# Patient Record
Sex: Female | Born: 1935 | Race: White | Hispanic: No | Marital: Married | Smoking: Never smoker
Health system: Southern US, Community
[De-identification: ages and names within clinical notes are randomized; demographics above are authoritative.]

## PROBLEM LIST (undated history)

## (undated) DIAGNOSIS — J189 Pneumonia, unspecified organism: Secondary | ICD-10-CM

## (undated) DIAGNOSIS — M810 Age-related osteoporosis without current pathological fracture: Secondary | ICD-10-CM

## (undated) DIAGNOSIS — Z8489 Family history of other specified conditions: Secondary | ICD-10-CM

## (undated) DIAGNOSIS — I10 Essential (primary) hypertension: Secondary | ICD-10-CM

## (undated) DIAGNOSIS — G709 Myoneural disorder, unspecified: Secondary | ICD-10-CM

## (undated) DIAGNOSIS — F419 Anxiety disorder, unspecified: Secondary | ICD-10-CM

## (undated) DIAGNOSIS — I4819 Other persistent atrial fibrillation: Secondary | ICD-10-CM

## (undated) DIAGNOSIS — I429 Cardiomyopathy, unspecified: Secondary | ICD-10-CM

## (undated) DIAGNOSIS — R5382 Chronic fatigue, unspecified: Secondary | ICD-10-CM

## (undated) DIAGNOSIS — M1990 Unspecified osteoarthritis, unspecified site: Secondary | ICD-10-CM

## (undated) DIAGNOSIS — E785 Hyperlipidemia, unspecified: Secondary | ICD-10-CM

## (undated) DIAGNOSIS — I517 Cardiomegaly: Secondary | ICD-10-CM

## (undated) DIAGNOSIS — I428 Other cardiomyopathies: Secondary | ICD-10-CM

## (undated) DIAGNOSIS — I509 Heart failure, unspecified: Secondary | ICD-10-CM

## (undated) DIAGNOSIS — I5022 Chronic systolic (congestive) heart failure: Secondary | ICD-10-CM

## (undated) DIAGNOSIS — E782 Mixed hyperlipidemia: Secondary | ICD-10-CM

## (undated) HISTORY — DX: Age-related osteoporosis without current pathological fracture: M81.0

## (undated) HISTORY — PX: TOTAL KNEE ARTHROPLASTY: SHX125

## (undated) HISTORY — DX: Other cardiomyopathies: I42.8

## (undated) HISTORY — DX: Chronic fatigue, unspecified: R53.82

## (undated) HISTORY — DX: Mixed hyperlipidemia: E78.2

## (undated) HISTORY — DX: Chronic systolic (congestive) heart failure: I50.22

## (undated) HISTORY — PX: APPENDECTOMY: SHX54

## (undated) HISTORY — PX: OTHER SURGICAL HISTORY: SHX169

## (undated) HISTORY — DX: Essential (primary) hypertension: I10

## (undated) HISTORY — DX: Anxiety disorder, unspecified: F41.9

## (undated) HISTORY — PX: ROTATOR CUFF REPAIR: SHX139

## (undated) HISTORY — DX: Cardiomegaly: I51.7

## (undated) HISTORY — DX: Other persistent atrial fibrillation: I48.19

## (undated) HISTORY — DX: Hyperlipidemia, unspecified: E78.5

## (undated) HISTORY — DX: Heart failure, unspecified: I50.9

## (undated) HISTORY — DX: Cardiomyopathy, unspecified: I42.9

---

## 2001-09-15 ENCOUNTER — Encounter: Payer: Self-pay | Admitting: Cardiovascular Disease

## 2001-09-15 ENCOUNTER — Encounter: Payer: Self-pay | Admitting: *Deleted

## 2001-09-15 ENCOUNTER — Inpatient Hospital Stay (HOSPITAL_COMMUNITY): Admission: EM | Admit: 2001-09-15 | Discharge: 2001-09-19 | Payer: Self-pay | Admitting: Emergency Medicine

## 2001-09-16 ENCOUNTER — Encounter: Payer: Self-pay | Admitting: *Deleted

## 2005-12-18 ENCOUNTER — Ambulatory Visit: Payer: Self-pay | Admitting: Internal Medicine

## 2006-07-22 ENCOUNTER — Ambulatory Visit: Payer: Self-pay | Admitting: Unknown Physician Specialty

## 2006-07-22 ENCOUNTER — Encounter: Payer: Self-pay | Admitting: Unknown Physician Specialty

## 2006-08-01 ENCOUNTER — Encounter: Payer: Self-pay | Admitting: Unknown Physician Specialty

## 2007-01-02 ENCOUNTER — Ambulatory Visit: Payer: Self-pay | Admitting: Internal Medicine

## 2007-02-19 ENCOUNTER — Encounter: Payer: Self-pay | Admitting: Orthopedic Surgery

## 2007-03-02 ENCOUNTER — Encounter: Payer: Self-pay | Admitting: Orthopedic Surgery

## 2007-04-01 ENCOUNTER — Encounter: Payer: Self-pay | Admitting: Orthopedic Surgery

## 2007-05-15 ENCOUNTER — Ambulatory Visit: Payer: Self-pay | Admitting: General Surgery

## 2007-12-17 ENCOUNTER — Encounter: Payer: Self-pay | Admitting: Cardiovascular Disease

## 2008-02-18 ENCOUNTER — Ambulatory Visit: Payer: Self-pay | Admitting: Internal Medicine

## 2008-02-20 ENCOUNTER — Ambulatory Visit: Payer: Self-pay | Admitting: Internal Medicine

## 2009-02-16 ENCOUNTER — Encounter: Payer: Self-pay | Admitting: Orthopedic Surgery

## 2009-02-21 ENCOUNTER — Ambulatory Visit: Payer: Self-pay | Admitting: Internal Medicine

## 2009-03-01 ENCOUNTER — Encounter: Payer: Self-pay | Admitting: Orthopedic Surgery

## 2009-03-31 ENCOUNTER — Encounter: Payer: Self-pay | Admitting: Orthopedic Surgery

## 2009-08-30 ENCOUNTER — Encounter: Payer: Self-pay | Admitting: Cardiovascular Disease

## 2009-11-24 ENCOUNTER — Encounter: Payer: Self-pay | Admitting: Orthopedic Surgery

## 2009-11-29 ENCOUNTER — Encounter: Payer: Self-pay | Admitting: Orthopedic Surgery

## 2009-12-30 ENCOUNTER — Encounter: Payer: Self-pay | Admitting: Orthopedic Surgery

## 2009-12-30 ENCOUNTER — Encounter: Payer: Self-pay | Admitting: Cardiovascular Disease

## 2010-01-29 ENCOUNTER — Encounter: Payer: Self-pay | Admitting: Orthopedic Surgery

## 2010-02-21 ENCOUNTER — Encounter: Payer: Self-pay | Admitting: Cardiovascular Disease

## 2010-02-22 ENCOUNTER — Ambulatory Visit: Payer: Self-pay | Admitting: Internal Medicine

## 2010-05-03 ENCOUNTER — Ambulatory Visit: Payer: Self-pay | Admitting: Cardiovascular Disease

## 2010-05-03 DIAGNOSIS — E785 Hyperlipidemia, unspecified: Secondary | ICD-10-CM | POA: Insufficient documentation

## 2010-05-03 DIAGNOSIS — I10 Essential (primary) hypertension: Secondary | ICD-10-CM

## 2010-05-03 DIAGNOSIS — I517 Cardiomegaly: Secondary | ICD-10-CM

## 2010-05-05 ENCOUNTER — Encounter: Payer: Self-pay | Admitting: Cardiovascular Disease

## 2010-06-19 ENCOUNTER — Ambulatory Visit: Payer: Self-pay | Admitting: Cardiovascular Disease

## 2010-07-07 ENCOUNTER — Encounter: Payer: Self-pay | Admitting: Cardiovascular Disease

## 2010-09-08 ENCOUNTER — Ambulatory Visit: Payer: Self-pay | Admitting: Internal Medicine

## 2010-10-31 NOTE — Letter (Signed)
Summary: Historic Patient File  Historic Patient File   Imported By: [place] Carbo 05/05/2010 09:14:57  _____________________________________________________________________  External Attachment:    Type:   Image     Comment:   External Document

## 2010-10-31 NOTE — Assessment & Plan Note (Signed)
Summary: ROV/AMD   Visit Type:  Follow-up Primary Provider:  Katherina Right Tate,M.D.  CC:  c/o shortness of breath when in a big hurry..  History of Present Illness: Robin Ortiz is a pleasant 76 year old woman with a history of hypertension, hyperlipidemia, remote history of nonischemic cardiomyopathy with initial ejection fraction 20-30% in 2002 that has improved to 50-55% on echocardiogram in March 2009, status post bilateral knee replacement who presents for routine follow up.   She reports that her blood pressure has been labile. She has been recording frequent systolic pressures in the 150s to 160s. Sometimes it decreases rapidly to low 100 systolic. She otherwise feels well. She does take care of numerous grandchildren and does have some stress. She does not exercise. She denies any symptoms of chest discomfort or shortness of breath.  Old EKG shows normal sinus rhythm with rate 59 beats per minute, no significant ST or T wave changes. Voltage criteria for LVH.  Remote cardiac catheterization in 2002 showing mild RCA disease at the ostium.  Most recent cholesterol from April of this year shows total cholesterol of 136, LDL 70, HDL 48  Current Medications (verified): 1)  Potassium Chloride Crys Cr 20 Meq Cr-Tabs (Potassium Chloride Crys Cr) .... Take One Tablet By Mouth As Needed With Lasix 2)  Furosemide 20 Mg Tabs (Furosemide) .... Take As Directed 3)  Aspirin 81 Mg Tbec (Aspirin) .... Take One Tablet By Mouth Daily 4)  Carvedilol 25 Mg Tabs (Carvedilol) .... Take One Tablet By Mouth Twice A Day 5)  Ramipril 10 Mg Caps (Ramipril) .... Take One Capsule By Mouth Two Times A Day 6)  Evista 60 Mg Tabs (Raloxifene Hcl) .... Once Daily 7)  Simvastatin 20 Mg Tabs (Simvastatin) .... Take One Tablet By Mouth Daily At Bedtime 8)  Multivitamins   Tabs (Multiple Vitamin) .... Once Daily 9)  Celebrex 200 Mg Caps (Celecoxib) 10)  Allegra 180 Mg Tabs (Fexofenadine Hcl) 11)  Calcium Carbonate-Vitamin D  600-400 Mg-Unit  Tabs (Calcium Carbonate-Vitamin D) .... Once Daily 12)  Fish Oil   Oil (Fish Oil) .... Once Daily 13)  Osteo Bi-Flex Joint Shield  Tabs (Misc Natural Products)  Allergies (verified): No Known Drug Allergies  Past History:  Past Medical History: Last updated: 05/03/2010 cardiomegaly Hyperlipidemia Hypertension  Past Surgical History: Last updated: 05/03/2010 Knee Arthroplasty-Total rotator cuff (left shoulder)  Family History: Last updated: 05/03/2010 Family History of Coronary Artery Disease: Father  Social History: Last updated: 05/03/2010 Retired  Married  Tobacco Use - No.  Alcohol Use - yes-- occas. glass of wine Drug Use - no Regular Exercise - yes--water aerobics twice a week.  Risk Factors: Exercise: yes (05/03/2010)  Risk Factors: Smoking Status: never (05/03/2010)  Review of Systems  The patient denies fever, weight loss, weight gain, vision loss, decreased hearing, hoarseness, chest pain, syncope, dyspnea on exertion, peripheral edema, prolonged cough, abdominal pain, incontinence, muscle weakness, depression, and enlarged lymph nodes.    Vital Signs:  Patient profile:   76 year old female Height:      66 inches Weight:      151 pounds BMI:     24.46 Pulse rate:   63 / minute BP sitting:   158 / 80  (left arm) Cuff size:   regular  Vitals Entered By: Bishop Dublin, CMA (June 19, 2010 3:37 PM)  Physical Exam  General:  Well developed, well nourished, in no acute distress. Head:  normocephalic and atraumatic Neck:  Neck supple, no JVD. No masses, thyromegaly  or abnormal cervical nodes. Lungs:  Clear bilaterally to auscultation and percussion. Heart:  Non-displaced PMI, chest non-tender; regular rate and rhythm, S1, S2 without murmurs, rubs or gallops. Carotid upstroke normal, no bruit.  Pedals normal pulses. No edema, no varicosities. Abdomen:  Bowel sounds positive; abdomen soft and non-tender without masses Msk:  Back  normal, normal gait. Muscle strength and tone normal. Pulses:  pulses normal in all 4 extremities Extremities:  No clubbing or cyanosis. Neurologic:  Alert and oriented x 3. Skin:  Intact without lesions or rashes. Psych:  Normal affect.   Impression & Recommendations:  Problem # 1:  HYPERTENSION, BENIGN (ICD-401.1) she has labile blood pressures. Typically they aren't elevated in the morning. We have suggested that she take her evening medications for bed rather than at 6 PM. If her pressures continued to be elevated, we will start amlodipine 2.5 mg daily, possibly titrating to 5 mg daily. If she has periods of hypotension, we may need to add a more short acting medication such as hydralazine.  Her updated medication list for this problem includes:    Furosemide 20 Mg Tabs (Furosemide) .Marland Kitchen... Take as directed    Aspirin 81 Mg Tbec (Aspirin) .Marland Kitchen... Take one tablet by mouth daily    Carvedilol 25 Mg Tabs (Carvedilol) .Marland Kitchen... Take one tablet by mouth twice a day    Ramipril 10 Mg Caps (Ramipril) .Marland Kitchen... Take one capsule by mouth two times a day    Amlodipine Besylate 5 Mg Tabs (Amlodipine besylate) .Marland Kitchen... Take 1/2-1  tablet by mouth daily  Problem # 2:  HYPERLIPIDEMIA-MIXED (ICD-272.4) Cholesterol is at goal on her current  medication regimen.  Her updated medication list for this problem includes:    Simvastatin 20 Mg Tabs (Simvastatin) .Marland Kitchen... Take one tablet by mouth daily at bedtime  Patient Instructions: 1)  Your physician has recommended you make the following change in your medication: START amolodipine 5mg  start taking 1/2 tab 2)  Your physician has requested that you regularly monitor and record your blood pressure readings at home.  Please call with your readings in 1 week.  Prescriptions: AMLODIPINE BESYLATE 5 MG TABS (AMLODIPINE BESYLATE) Take 1/2-1  tablet by mouth daily  #30 x 6   Entered by:   Benedict Needy, RN   Authorized by:   Dossie Arbour MD   Signed by:   Benedict Needy,  RN on 06/19/2010   Method used:   Electronically to        [facility]* (retail)       [address] [place], [place]  [address]       Ph: [phone]       Fax: [phone]   RxID:   [id number]

## 2010-10-31 NOTE — Letter (Signed)
Summary: Medical Record Release  Medical Record Release   Imported By: Harlon Flor 08/09/2010 15:53:29  _____________________________________________________________________  External Attachment:    Type:   Image     Comment:   External Document

## 2010-10-31 NOTE — Cardiovascular Report (Signed)
Summary: Cardiac Cath Other  Cardiac Cath Other   Imported By: Harlon Flor 03/22/2010 08:20:05  _____________________________________________________________________  External Attachment:    Type:   Image     Comment:   External Document

## 2010-10-31 NOTE — Assessment & Plan Note (Signed)
Summary: NP6/GLC   Visit Type:  Initial Consult Primary Provider:  Katherina Right Tate,M.D.  CC:  "Doing well"..  History of Present Illness: Robin Ortiz is a pleasant 76 year old woman with a history of hypertension, hyperlipidemia, remote history of nonischemic cardiomyopathy with initial ejection fraction 20-30% in 2002 that has improved to 50-55% on echocardiogram in March 2009, status post bilateral knee replacement who presents to establish care. She was last seen by myself at [facility] in November 2010.  Overall she states that she is doing well. She does not exercise to any significant degree though she does take care of numerous grandchildren. There has been significant stress in her life. She would like to take some time off away from family to relax. She has had some symptoms of shortness of breath, sometimes with exertion such as with swimming. This seems to come and go and she is uncertain if it is due to stress. It also may be due to the humid weather. [place].  EKG shows normal sinus rhythm with rate 59 beats per minute, no significant ST or T wave changes. Voltage criteria for LVH.  Remote cardiac catheterization in 2002 showing mild RCA disease at the ostium.  Most recent cholesterol from April of this year shows total cholesterol of 136, LDL 70, HDL 48  Preventive Screening-Counseling & Management  Alcohol-Tobacco     Smoking Status: never  Caffeine-Diet-Exercise     Does Patient Exercise: yes      Drug Use:  no.    Current Medications (verified): 1)  Potassium Chloride Crys Cr 20 Meq Cr-Tabs (Potassium Chloride Crys Cr) .... Take One Tablet By Mouth As Needed With Lasix 2)  Furosemide 20 Mg Tabs (Furosemide) .... Take As Directed 3)  Aspirin 81 Mg Tbec (Aspirin) .... Take One Tablet By Mouth Daily 4)  Carvedilol 25 Mg Tabs (Carvedilol) .... Take One Tablet By Mouth Twice A Day 5)   Ramipril 10 Mg Caps (Ramipril) .... Take One Capsule By Mouth Two Times A Day 6)  Evista 60 Mg Tabs (Raloxifene Hcl) .... Once Daily 7)  Simvastatin 20 Mg Tabs (Simvastatin) .... Take One Tablet By Mouth Daily At Bedtime 8)  Multivitamins   Tabs (Multiple Vitamin) .... Once Daily 9)  Celebrex 200 Mg Caps (Celecoxib) 10)  Allegra 180 Mg Tabs (Fexofenadine Hcl) 11)  Calcium Carbonate-Vitamin D 600-400 Mg-Unit  Tabs (Calcium Carbonate-Vitamin D) .... Once Daily 12)  Fish Oil   Oil (Fish Oil) .... Once Daily 13)  Osteo Bi-Flex Joint Shield  Tabs (Misc Natural Products)  Allergies (verified): No Known Drug Allergies  Past History:  Past Medical History: cardiomegaly Hyperlipidemia Hypertension  Past Surgical History: Knee Arthroplasty-Total rotator cuff (left shoulder)  Family History: Family History of Coronary Artery Disease: Father  Social History: Retired  Married  Tobacco Use - No.  Alcohol Use - yes-- occas. glass of wine Drug Use - no Regular Exercise - yes--water aerobics twice a week. Smoking Status:  never Drug Use:  no Does Patient Exercise:  yes  Review of Systems       The patient complains of dyspnea on exertion.  The patient denies fever, weight loss, weight gain, vision loss, decreased hearing, hoarseness, chest pain, syncope, peripheral edema, prolonged cough, abdominal pain, incontinence, muscle weakness, depression, and enlarged lymph nodes.    Vital Signs:  Patient profile:   76 year old female Height:  66 inches Weight:      148 pounds BMI:     23.97 Pulse rate:   59 / minute BP sitting:   155 / 83  (left arm) Cuff size:   regular  Vitals Entered By: Robin Ortiz, CMA (May 03, 2010 3:14 PM)  Physical Exam  General:  Well developed, well nourished, in no acute distress. Head:  normocephalic and atraumatic Neck:  Neck supple, no JVD. No masses, thyromegaly or abnormal cervical nodes. Lungs:  Clear bilaterally to auscultation and  percussion. Heart:  Non-displaced PMI, chest non-tender; regular rate and rhythm, S1, S2 without murmurs, rubs or gallops. Carotid upstroke normal, no bruit.  Pedals normal pulses. No edema, no varicosities. Abdomen:  Bowel sounds positive; abdomen soft and non-tender without masses Msk:  Back normal, normal gait. Muscle strength and tone normal. Pulses:  pulses normal in all 4 extremities Extremities:  No clubbing or cyanosis. Neurologic:  Alert and oriented x 3. Skin:  Intact without lesions or rashes. Psych:  Normal affect.   Impression & Recommendations:  Problem # 1:  CARDIOMEGALY (ICD-429.3) significantly improved systolic function based on echocardiogram in 2009. No symptoms of heart failure. She does have some symptoms of shortness of breath which wax and wane, and with exertion. This may be due to her hypertension.  Orders: EKG w/ Interpretation (93000)  Problem # 2:  HYPERTENSION, BENIGN (ICD-401.1) Blood pressure is elevated on recheck. Systolic of 160 bilaterally. I've asked her to buy a new blood pressure cuff, monitor her blood pressure at home and call us with the results over the next week or 2. Additional medications that we could use for blood pressure include amlodipine.  Her updated medication list for this problem includes:    Furosemide 20 Mg Tabs (Furosemide) .Marland Kitchen... Take as directed    Aspirin 81 Mg Tbec (Aspirin) .Marland Kitchen... Take one tablet by mouth daily    Carvedilol 25 Mg Tabs (Carvedilol) .Marland Kitchen... Take one tablet by mouth twice a day    Ramipril 10 Mg Caps (Ramipril) .Marland Kitchen... 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 We'll continue to treat her aggressively.  Her updated medication list for this problem includes:    Simvastatin 20 Mg Tabs (Simvastatin) .Marland Kitchen... Take one tablet by mouth daily at bedtime  Patient Instructions: 1)  Your  physician wants you to follow-up in:   1 year You will receive a reminder letter in the mail two months in advance. If you don't receive a letter, please call our office to schedule the follow-up appointment. 2)  Your physician has requested that you regularly monitor and record your blood pressure readings at home.  Please use the same machine at the same time of day to check your readings and record them to bring to your follow-up visit.

## 2010-12-14 ENCOUNTER — Encounter: Payer: Self-pay | Admitting: Cardiovascular Disease

## 2010-12-14 ENCOUNTER — Ambulatory Visit (INDEPENDENT_AMBULATORY_CARE_PROVIDER_SITE_OTHER): Payer: Medicare Other | Admitting: Cardiovascular Disease

## 2010-12-14 DIAGNOSIS — I10 Essential (primary) hypertension: Secondary | ICD-10-CM

## 2010-12-14 DIAGNOSIS — R0602 Shortness of breath: Secondary | ICD-10-CM

## 2010-12-14 DIAGNOSIS — I429 Cardiomyopathy, unspecified: Secondary | ICD-10-CM | POA: Insufficient documentation

## 2010-12-14 DIAGNOSIS — E785 Hyperlipidemia, unspecified: Secondary | ICD-10-CM

## 2010-12-19 NOTE — Assessment & Plan Note (Signed)
Summary: SOB/f/u appt/sab   Visit Type:  Follow-up Primary Provider:  Katherina Right Tate,M.D.  CC:  c/o SOB and pt has had pneumonia since pt has been here last. Pt is also c/o fatigue. Denies chest pain.Marland Kitchen  History of Present Illness: Robin Ortiz is a pleasant 76 year old woman with a history of hypertension, hyperlipidemia, remote history of nonischemic cardiomyopathy with initial ejection fraction 20-30% in 2002 that has improved to 50-55% on echocardiogram in March 2009, status post bilateral knee replacement who presents for routine follow up.   Robin Ortiz reports that she has had pneumonia in December and again in February. She was concerned that some of her shortness of breath was cardiac etiology. She denies any lower extremity edema. She has a cough but this has improved. She does have some hoarseness which she feels is secondary to allergies. No PND or orthopnea.  EKG shows normal sinus rhythm with rate 61 beats per minute with nonspecific ST changes in V5, V6, 3 and aVF  Remote cardiac catheterization in 2002 showing mild RCA disease at the ostium.  Most recent cholesterol from April of 2011 shows total cholesterol of 136, LDL 70, HDL 48  Current Medications (verified): 1)  Aspirin 81 Mg Tbec (Aspirin) .... Take One Tablet By Mouth Daily 2)  Carvedilol 25 Mg Tabs (Carvedilol) .... Take One Tablet By Mouth Twice A Day 3)  Ramipril 10 Mg Caps (Ramipril) .... Take One Capsule By Mouth Two Times A Day 4)  Evista 60 Mg Tabs (Raloxifene Hcl) .... Once Daily 5)  Simvastatin 20 Mg Tabs (Simvastatin) .... Take One Tablet By Mouth Daily At Bedtime 6)  Multivitamins   Tabs (Multiple Vitamin) .... Once Daily 7)  Celebrex 200 Mg Caps (Celecoxib) 8)  Allegra 180 Mg Tabs (Fexofenadine Hcl) 9)  Calcium Carbonate-Vitamin D 600-400 Mg-Unit  Tabs (Calcium Carbonate-Vitamin D) .... Once Daily 10)  Fish Oil   Oil (Fish Oil) .... Once Daily 11)  Osteo Bi-Flex Joint Shield  Tabs (Misc Natural Products) 12)   Amlodipine Besylate 5 Mg Tabs (Amlodipine Besylate) .... 1/2 Tablet Two Times A Day  Allergies (verified): No Known Drug Allergies  Past History:  Past Medical History: Last updated: 05/03/2010 cardiomegaly Hyperlipidemia Hypertension  Past Surgical History: Last updated: 05/03/2010 Knee Arthroplasty-Total rotator cuff (left shoulder)  Family History: Last updated: 05/03/2010 Family History of Coronary Artery Disease: Father  Social History: Last updated: 05/03/2010 Retired  Married  Tobacco Use - No.  Alcohol Use - yes-- occas. glass of wine Drug Use - no Regular Exercise - yes--water aerobics twice a week.  Risk Factors: Exercise: yes (05/03/2010)  Risk Factors: Smoking Status: never (05/03/2010)  Review of Systems  The patient denies fever, weight loss, weight gain, vision loss, decreased hearing, hoarseness, chest pain, syncope, dyspnea on exertion, peripheral edema, prolonged cough, abdominal pain, incontinence, muscle weakness, depression, and enlarged lymph nodes.         course voice, coughing  Vital Signs:  Patient profile:   76 year old female Height:      66 inches Weight:      151.50 pounds BMI:     24.54 Pulse rate:   61 / minute BP sitting:   124 / 68  (left arm) Cuff size:   regular  Vitals Entered By: Lysbeth Galas CMA (December 14, 2010 10:53 AM)  Physical Exam  General:  Well developed, well nourished, in no acute distress. Head:  normocephalic and atraumatic Neck:  Neck supple, no JVD. No masses, thyromegaly or abnormal  cervical nodes. Lungs:  Clear bilaterally to auscultation and percussion. Heart:  Non-displaced PMI, chest non-tender; regular rate and rhythm, S1, S2 without murmurs, rubs or gallops. Carotid upstroke normal, no bruit.  Pedals normal pulses. No edema, no varicosities. Abdomen:  Bowel sounds positive; abdomen soft and non-tender without masses Msk:  Back normal, normal gait. Muscle strength and tone normal. Pulses:  pulses  normal in all 4 extremities Extremities:  No clubbing or cyanosis. Neurologic:  Alert and oriented x 3. Skin:  Intact without lesions or rashes. Psych:  Normal affect.   Impression & Recommendations:  Problem # 1:  CARDIOMYOPATHY, SECONDARY (ICD-425.9) previous scar moderately with medical management and improved ejection fraction. Her current presentation or shortness of breath is likely noncardiac and probably residual inflammatory lung disease from her recent pneumonia. No further medication changes made.  The following medications were removed from the medication list:    Furosemide 20 Mg Tabs (Furosemide) .Marland Kitchen... Take as directed Her updated medication list for this problem includes:    Aspirin 81 Mg Tbec (Aspirin) .Marland Kitchen... Take one tablet by mouth daily    Carvedilol 25 Mg Tabs (Carvedilol) .Marland Kitchen... Take one tablet by mouth twice a day    Ramipril 10 Mg Caps (Ramipril) .Marland Kitchen... Take one capsule by mouth two times a day    Amlodipine Besylate 5 Mg Tabs (Amlodipine besylate) .Marland Kitchen... 1/2 tablet two times a day  Problem # 2:  HYPERTENSION, BENIGN (ICD-401.1) Blood pressure is well controlled on her current medication regimen. No changes made.  The following medications were removed from the medication list:    Furosemide 20 Mg Tabs (Furosemide) .Marland Kitchen... Take as directed Her updated medication list for this problem includes:    Aspirin 81 Mg Tbec (Aspirin) .Marland Kitchen... Take one tablet by mouth daily    Carvedilol 25 Mg Tabs (Carvedilol) .Marland Kitchen... Take one tablet by mouth twice a day    Ramipril 10 Mg Caps (Ramipril) .Marland Kitchen... Take one capsule by mouth two times a day    Amlodipine Besylate 5 Mg Tabs (Amlodipine besylate) .Marland Kitchen... 1/2 tablet two times a day  Problem # 3:  HYPERLIPIDEMIA-MIXED (ICD-272.4) We have stressed that she continue on her current dose of simvastatin.  Her updated medication list for this problem includes:    Simvastatin 20 Mg Tabs (Simvastatin) .Marland Kitchen... Take one tablet by mouth daily at  bedtime  Other Orders: EKG w/ Interpretation (93000)

## 2011-01-25 ENCOUNTER — Encounter: Payer: Self-pay | Admitting: Cardiovascular Disease

## 2011-02-02 ENCOUNTER — Encounter: Payer: Self-pay | Admitting: Cardiovascular Disease

## 2011-02-16 NOTE — Discharge Summary (Signed)
[place]  Patient:    Robin Ortiz, [name] Visit Number: [id number] MRN: [id number]          Service Type: MED Location: [phone] Attending Physician:  Darlin Priestly Dictated by:   Marya Fossa, P.A. Admit Date:  09/15/2001 Discharge Date: 09/19/2001   CC:         Dewaine Oats, M.D.   Discharge Summary  DATE OF BIRTH:  11/25/1934  [facility] MEDICAL RECORD NUMBER:  [phone]  ADMISSION DIAGNOSES: 1. Congestive heart failure. 2. Unstable angina. 3. Hypertension, uncontrolled.  DISCHARGE DIAGNOSES: 1. Congestive heart failure, resolved. 2. Noncritical coronary artery disease, negative myocardial infarction. 3. Left ventricular dysfunction ejection fraction 20 to 25% with mild    mitral regurgitation. 4. Hypertension - improved control.  HISTORY OF PRESENT ILLNESS:  Ms. Robin Ortiz is a pleasant 76 year old white female patient of Lenise Herald, M.D. who has had shortness of breath for four to five weeks.  She was seen in the office on September 03, 2001, and a Cardiolite and two-dimensional echocardiogram were scheduled and Altace was added for hypertension.  However, this past weekend, [place] and developed increased shortness of breath with any activity as well as lower extremity edema.  She also had some mild left anterior chest discomfort which was aching and extended to the scapula.  The symptoms lasted most of the weekend and are still present.  She called the office this morning and was instructed to come to the emergency room.  In the ER, EKG showed small Q wave in 1 which is the only real change from September 03, 2001, question significance.  Chest x-ray shows increased vascular congestion and cardiac enlargement.  Initial cardiac enzymes were negative. Potassium 3.6, BUN 15, creatinine 0.9, and hemoglobin 15.  The patient will be admitted for IV nitroglycerin, IV heparin,  IV Lasix, serial cardiac enzymes.  Will plan cardiac catheterization (left and right heart).  PROCEDURE:  Cardiac catheterization September 15, 2001, by Lenise Herald, M.D. Complications were none.  HOSPITAL COURSE:  Ms. Robin Ortiz was admitted to [facility] on September 15, 2001, with heart failure and chest pain.  Admitting labs are as above and essentially unremarkable.  Cardiac enzymes were negative.  The patient was taken to the cardiac catheterization lab by Dr. Jenne Campus on September 15, 2001, which showed essentially normal coronary arteries with a small 50% ostial RCA lesion.  EF significantly depressed at 20 to 25% and there was a mild MR.  Distal aortography was unremarkable.  Dr. Jenne Campus recommended beginning milrinone therapy IV and uptitrate ACE inhibitor therapy.  We will add digoxin and Coreg.  We will continue to diurese.  For right and left heart catheterization pressures, please see dictated cardiac catheterization report.  The patient was treated with milrinone therapy and diuresis.  We also continued to uptitrate medication as tolerated in the hospital.  Formal two-dimensional echocardiogram was performed on September 16, 2001, and this revealed an EF of 20 to 30% with severe diffuse left ventricular hypokinesis, moderate MR, mild RV hypokinesis.  The patient diuresed very well during the hospital stay over 5 liters. Her symptoms of shortness of breath significantly improved.  Blood pressure control was improved with medication adjustment.  On September 19, 2001, the patient continued to remain stable.  Lab work is pending at this point in time.  If everything looks okay, she will be discharged to home today.  Milrinone  of course was weaned over the hospital course and discontinued by December 19.  DISCHARGE MEDICATIONS: 1. Digoxin 0.125 mg a day. 2. Lasix 40 mg a day. 3. Coreg 3.125 mg b.i.d. 4. Zocor 20 mg q.h.s. 5. Allegra and Evista as  before. 6. K-Dur 20 mEq a day. 7. Altace 10 mg b.i.d. 8. Coated aspirin 81 mg a day.  ACTIVITY:  No strenuous activity until seen back.  DIET:  No added salt, 2 gram salt restriction.  1.5 liter fluid restriction. Weigh and record daily.  If weight is up 2 pounds in one day or 5 pounds in one week, she is to call.  We will have her get a BMP on September 25, 2001.  She will see Dr. Jenne Campus back on December 27, at 9 a.m. Dictated by:   Marya Fossa, P.A. Attending Physician:  Darlin Priestly DD:  09/19/01 TD:  09/21/01 Job: [id number]

## 2011-02-16 NOTE — Cardiovascular Report (Signed)
[place]  Patient:    Robin Ortiz, Robin Ortiz Visit Number: [id number] MRN: [id number]          Service Type: MED Location: [phone] Attending Physician:  Darlin Priestly Dictated by:   Darlin Priestly, M.D. Proc. Date: 09/15/01 Admit Date:  09/15/2001   CC:         Dewaine Oats, M.D., [place], [place]                        Cardiac Catheterization  PROCEDURES: 1. Right heart catheterization. 2. Left heart catheterization. 3. Coronary angiography. 4. Left ventriculogram. 5. Abdominal aortogram. 6. Cardiac outputs.  COMPLICATIONS: None.  INDICATIONS: The patient is a 76 year old female, patient of Dr. Dewaine Oats in [place], [place] and Dr. Lenise Herald, with a several month history of increasing dyspnea on exertion and occasional lower extremity edema. The patient had a marked increase in her symptoms over the last 48 hours and presented to the ER with bibasilar crackles and symptoms consistent with CHF. She is now brought for cardiac catheterization to define her coronary anatomy and to obtain her right heart hemodynamics.  DESCRIPTION OF PROCEDURE: After given informed written consent, the patient was brought to the cardiac catheterization lab where her right and left groins were shaved, prepped, and draped in the usual sterile fashion.  ECG monitoring was established.  Using modified Seldinger technique, a #8 French venous sheath was inserted in the right femoral vein, and a 6 [place] arterial sheath inserted in the right femoral artery. Next, under fluoroscopic guidance a #7 [place] Swan-Ganz catheter was then floated into the RA, RV, PA, and pulmonary wedge position and hemodynamic measurements were made in each. A 6 French diagnostic catheter was then used to perform diagnostic angiography. This reveals a large left main with no significant disease.  The LAD is a large vessel which coursed to the apex and gave rise to  two diagonal branches. The LAD has no significant disease. The first and second diagonals are medium sized vessels with no significant disease.  Left circumflex is a large vessel which coursed in the AV groove and gave rise to two obtuse marginal branches. The AV groove circumflex has no significant disease. The first OM is a large vessel which bifurcates distally and has no significant disease. The second OM is a medium sized vessel with no significant disease.  The right coronary artery is a large vessel which is dominant and gives rise to both the PDA as well as the posterolateral branch. There is 50% ostial RCA lesion. The PDA and posterolateral branch have no significant disease.  LEFT VENTRICULOGRAM: Left ventriculogram revealed moderate to severely depressed left ventricular systolic function. Estimated ejection fraction of 20-25% with global hypokinesis.  There is mild mitral regurgitation.  ABDOMINAL AORTOGRAM: Abdominal aortogram reveals no evidence of significant renal artery stenosis or aneurysm.  HEMODYNAMICS: RA 16, RV 64/16, PA ______ of 24. Pulmonary capillary wedge pressure of 24, systemic arterial pressure 161/86, LV systemic pressure 168/18, LVEDP of 27. Cardiac output is 3.3. Cardiac index 1.8. PA saturation 76%. AO saturation 88% on 2 L O2.  CONCLUSIONS: 1. No significant coronary artery disease. 2. Moderate severely depressed left ventricular systolic function. 3. Mild mitral regurgitation. 4. No evidence of renal artery stenosis. 5. Elevated pulmonary capillary wedge pressure with moderate pulmonary    hypertension. 6. Cardiac output 3.3, cardiac index 1.8. PA saturation 76%, AO saturation    88%. 7.  Systemic hypertension. Dictated by:   Darlin Priestly, M.D. Attending Physician:  Darlin Priestly DD:  09/15/01 TD:  09/16/01 Job: [id number]

## 2011-03-02 ENCOUNTER — Ambulatory Visit: Payer: Self-pay | Admitting: Internal Medicine

## 2011-04-26 ENCOUNTER — Telehealth: Payer: Self-pay

## 2011-04-26 MED ORDER — CARVEDILOL 25 MG PO TABS: 25.0000 mg | ORAL_TABLET | Freq: Two times a day (BID) | ORAL | Status: DC

## 2011-04-26 NOTE — Telephone Encounter (Signed)
Requesting a refill on carvedilol 25 mg take one tablet twice a day.

## 2011-08-30 ENCOUNTER — Telehealth: Payer: Self-pay

## 2011-08-30 MED ORDER — AMLODIPINE BESYLATE 5 MG PO TABS: ORAL_TABLET | ORAL | Status: DC

## 2011-08-30 NOTE — Telephone Encounter (Signed)
Refill sent for amlodipine 5 mg take 1/2-1 tablet by mouth daily.

## 2011-12-14 ENCOUNTER — Encounter: Payer: Self-pay | Admitting: *Deleted

## 2011-12-17 ENCOUNTER — Ambulatory Visit (INDEPENDENT_AMBULATORY_CARE_PROVIDER_SITE_OTHER): Payer: Medicare Other | Admitting: Cardiovascular Disease

## 2011-12-17 ENCOUNTER — Encounter: Payer: Self-pay | Admitting: Cardiovascular Disease

## 2011-12-17 VITALS — BP 152/88 | HR 60 | Ht 66.0 in | Wt 156.0 lb

## 2011-12-17 DIAGNOSIS — I517 Cardiomegaly: Secondary | ICD-10-CM

## 2011-12-17 DIAGNOSIS — E785 Hyperlipidemia, unspecified: Secondary | ICD-10-CM

## 2011-12-17 DIAGNOSIS — I429 Cardiomyopathy, unspecified: Secondary | ICD-10-CM

## 2011-12-17 DIAGNOSIS — I10 Essential (primary) hypertension: Secondary | ICD-10-CM

## 2011-12-17 NOTE — Assessment & Plan Note (Signed)
Cholesterol is at goal on the current lipid regimen. No changes to the medications were made.  

## 2011-12-17 NOTE — Assessment & Plan Note (Signed)
Blood pressure is elevated. We have suggested if this continues to be elevated at home, that she increase her amlodipine to 5 mg daily up from 2.5 mg.

## 2011-12-17 NOTE — Patient Instructions (Signed)
You are doing well. No medication changes were made. Monitor your blood pressure at home  Please call us if you have new issues that need to be addressed before your next appt.  Your physician wants you to follow-up in: 12 months.  You will receive a reminder letter in the mail two months in advance. If you don't receive a letter, please call our office to schedule the follow-up appointment.

## 2011-12-17 NOTE — Progress Notes (Signed)
Patient ID: Robin Ortiz, female    DOB: 04-05-1935, 77 y.o.   MRN: [id number]  HPI Comments: [name] is a pleasant 77 year old woman with a history of hypertension, hyperlipidemia, remote history of nonischemic cardiomyopathy with initial ejection fraction 20-30% in 2002 that has improved to 50-55% on echocardiogram in March 2009, status post bilateral knee replacement who presents for routine follow up.     [name] reports that she is doing well. She works out at [employer] doing [employer] at least twice per week. The other days she has a busy lifestyle and takes care of her grandchildren. She does have some fatigue during the daytime. No symptoms of shortness of breath, edema, lightheadedness, weight gain.  Total cholesterol 146, LDL 75 Recent weight gain She did a lifeline screening showed no significant carotid arterial disease, no AAA, no significant PAD. She is low risk for osteoporosis   EKG shows normal sinus rhythm with rate 52 beats per minute with no significant ST or T wave changes, rare APC   Remote cardiac catheterization in 2002 showing mild RCA disease at the ostium.     Outpatient Encounter Prescriptions as of 12/17/2011  Medication Sig Dispense Refill  . amLODipine (NORVASC) 5 MG tablet Take 1/2-1 tablet by mouth daily.  30 tablet  6  . aspirin 81 MG tablet Take 81 mg by mouth daily.        . Calcium-Vitamin D 600-200 MG-UNIT per tablet Take 1 tablet by mouth daily.        . carvedilol (COREG) 25 MG tablet Take 25 mg by mouth daily.      . furosemide (LASIX) 20 MG tablet Take 20 mg by mouth as needed.       . Misc Natural Products (OSTEO BI-FLEX ADV JOINT SHIELD PO) Take 1 tablet by mouth daily.        . Multiple Vitamin (MULTIVITAMIN) tablet Take 1 tablet by mouth daily.        . Omega-3 Fatty Acids (FISH OIL) 1000 MG CAPS Take 1 capsule by mouth daily.        . potassium chloride (KLOR-CON) 20 MEQ packet Take 20 mEq by mouth daily as needed. With lasix        . raloxifene (EVISTA) 60 MG tablet Take 60 mg by mouth daily.        . ramipril (ALTACE) 10 MG capsule Take 10 mg by mouth 2 (two) times daily.        . simvastatin (ZOCOR) 20 MG tablet Take 20 mg by mouth at bedtime.        Marland Kitchen DISCONTD: carvedilol (COREG) 25 MG tablet Take 1 tablet (25 mg total) by mouth 2 (two) times daily with a meal.  60 tablet  6  . celecoxib (CELEBREX) 200 MG capsule Take 200 mg by mouth 2 (two) times daily.        . fexofenadine (ALLEGRA) 180 MG tablet Take 180 mg by mouth daily. 1/2 to 1 tablet qd         Review of Systems  Constitutional: Positive for fatigue.  HENT: Negative.   Eyes: Negative.   Respiratory: Negative.   Cardiovascular: Negative.   Gastrointestinal: Negative.   Musculoskeletal: Negative.   Skin: Negative.   Neurological: Negative.   Hematological: Negative.   Psychiatric/Behavioral: Negative.   All other systems reviewed and are negative.    BP 152/88  Pulse 60  Ht 5\' 6"  (1.676 m)  Wt 156 lb (70.761 kg)  BMI 25.18 kg/m2  Physical Exam  Nursing note and vitals reviewed. Constitutional: She is oriented to person, place, and time. She appears well-developed and well-nourished.  HENT:  Head: Normocephalic.  Nose: Nose normal.  Mouth/Throat: Oropharynx is clear and moist.  Eyes: Conjunctivae are normal. Pupils are equal, round, and reactive to light.  Neck: Normal range of motion. Neck supple. No JVD present.  Cardiovascular: Normal rate, regular rhythm, S1 normal, S2 normal, normal heart sounds and intact distal pulses.  Exam reveals no gallop and no friction rub.   No murmur heard. Pulmonary/Chest: Effort normal and breath sounds normal. No respiratory distress. She has no wheezes. She has no rales. She exhibits no tenderness.  Abdominal: Soft. Bowel sounds are normal. She exhibits no distension. There is no tenderness.  Musculoskeletal: Normal range of motion. She exhibits no edema and no tenderness.  Lymphadenopathy:    She has  no cervical adenopathy.  Neurological: She is alert and oriented to person, place, and time. Coordination normal.  Skin: Skin is warm and dry. No rash noted. No erythema.  Psychiatric: She has a normal mood and affect. Her behavior is normal. Judgment and thought content normal.         Assessment and Plan

## 2011-12-17 NOTE — Assessment & Plan Note (Signed)
No signs of recurrent cardiac dysfunction given her clinical presentation today. She appears to be doing well.

## 2011-12-27 ENCOUNTER — Telehealth: Payer: Self-pay

## 2011-12-27 MED ORDER — CARVEDILOL 25 MG PO TABS: 25.0000 mg | ORAL_TABLET | Freq: Every day | ORAL | Status: DC

## 2011-12-27 NOTE — Telephone Encounter (Signed)
Needs a refill on carvedilol 25 mg take one tablet twice a day.

## 2012-01-31 ENCOUNTER — Other Ambulatory Visit: Payer: Self-pay | Admitting: *Deleted

## 2012-01-31 MED ORDER — CARVEDILOL 25 MG PO TABS: 25.0000 mg | ORAL_TABLET | Freq: Every day | ORAL | Status: DC

## 2012-01-31 NOTE — Telephone Encounter (Signed)
Refilled Carvedilol. 

## 2012-03-03 ENCOUNTER — Ambulatory Visit: Payer: Self-pay | Admitting: Internal Medicine

## 2012-06-04 ENCOUNTER — Ambulatory Visit: Payer: Self-pay | Admitting: General Surgery

## 2012-06-27 ENCOUNTER — Other Ambulatory Visit: Payer: Self-pay

## 2012-06-27 MED ORDER — AMLODIPINE BESYLATE 5 MG PO TABS: ORAL_TABLET | ORAL | Status: DC

## 2012-06-27 NOTE — Telephone Encounter (Signed)
Refill sent for amlodipine.  

## 2012-09-09 ENCOUNTER — Other Ambulatory Visit: Payer: Self-pay

## 2012-09-09 MED ORDER — CARVEDILOL 25 MG PO TABS: 25.0000 mg | ORAL_TABLET | Freq: Two times a day (BID) | ORAL | Status: DC

## 2012-09-09 NOTE — Telephone Encounter (Signed)
Refill sent for coreg 25 mg take one tablet twice a day.

## 2012-09-10 ENCOUNTER — Other Ambulatory Visit: Payer: Self-pay

## 2012-09-10 MED ORDER — RAMIPRIL 10 MG PO CAPS: 10.0000 mg | ORAL_CAPSULE | Freq: Two times a day (BID) | ORAL | Status: DC

## 2012-12-16 ENCOUNTER — Ambulatory Visit: Payer: Medicare Other | Admitting: Cardiovascular Disease

## 2012-12-23 ENCOUNTER — Ambulatory Visit (INDEPENDENT_AMBULATORY_CARE_PROVIDER_SITE_OTHER): Payer: Medicare Other | Admitting: Cardiovascular Disease

## 2012-12-23 ENCOUNTER — Encounter: Payer: Self-pay | Admitting: Cardiovascular Disease

## 2012-12-23 VITALS — BP 152/82 | HR 58 | Ht 66.0 in | Wt 156.0 lb

## 2012-12-23 DIAGNOSIS — I429 Cardiomyopathy, unspecified: Secondary | ICD-10-CM

## 2012-12-23 DIAGNOSIS — E785 Hyperlipidemia, unspecified: Secondary | ICD-10-CM

## 2012-12-23 DIAGNOSIS — I10 Essential (primary) hypertension: Secondary | ICD-10-CM

## 2012-12-23 NOTE — Patient Instructions (Addendum)
You are doing well. No medication changes were made. Please monitor your blood pressure periodically Lasix as needed  Please call us if you have new issues that need to be addressed before your next appt.  Your physician wants you to follow-up in: 12 months.  You will receive a reminder letter in the mail two months in advance. If you don't receive a letter, please call our office to schedule the follow-up appointment.

## 2012-12-23 NOTE — Assessment & Plan Note (Signed)
Cholesterol is at goal on the current lipid regimen. No changes to the medications were made.  

## 2012-12-23 NOTE — Assessment & Plan Note (Signed)
Blood pressure is well controlled on today's visit. No changes made to the medications. 

## 2012-12-23 NOTE — Progress Notes (Signed)
Patient ID: Robin Ortiz, female    DOB: [birth year], 78 y.o.   MRN: [id number]  HPI Comments: [name] is a pleasant 78 year-old woman with a history of hypertension, hyperlipidemia, remote history of nonischemic cardiomyopathy with initial ejection fraction 20-30% in 2002 that has improved to 50-55% on echocardiogram in March 2009, status post bilateral knee replacement who presents for routine follow up.    She reports that she has had right ankle swelling periodically, typically after standing for long periods of time. Also comes on after traveling. She does have some shortness of breath with steep stairs, sometimes when carrying groceries otherwise she feels well. She is doing water aerobics without any significant symptoms.she has a busy lifestyle and takes care of her grandchildren.  No symptoms of shortness of breath, edema, lightheadedness, weight gain.  Total cholesterol 141, LDL 73, HDL 55 Recent weight gain She did a lifeline screening showed no significant carotid arterial disease, no AAA, no significant PAD. She is low risk for osteoporosis   EKG shows normal sinus rhythm with rate 58 beats per minute with no significant ST or T wave changes   Remote cardiac catheterization in 2002 showing mild RCA disease at the ostium.     Outpatient Encounter Prescriptions as of 12/23/2012  Medication Sig Dispense Refill  . amLODipine (NORVASC) 5 MG tablet Take 1/2-1 tablet by mouth daily.  30 tablet  6  . aspirin 81 MG tablet Take 81 mg by mouth daily.        . Calcium-Vitamin D 600-200 MG-UNIT per tablet Take 1 tablet by mouth daily.        . carvedilol (COREG) 25 MG tablet Take 1 tablet (25 mg total) by mouth 2 (two) times daily with a meal.  60 tablet  6  . fexofenadine (ALLEGRA) 180 MG tablet Take 180 mg by mouth daily as needed. 1/2 to 1 tablet qd      . furosemide (LASIX) 20 MG tablet Take 20 mg by mouth as needed.       . Misc Natural Products (OSTEO BI-FLEX ADV JOINT SHIELD  PO) Take 1 tablet by mouth daily.        . Multiple Vitamin (MULTIVITAMIN) tablet Take 1 tablet by mouth daily.        . Omega-3 Fatty Acids (FISH OIL) 1000 MG CAPS Take 1 capsule by mouth daily.        . potassium chloride (KLOR-CON) 20 MEQ packet Take 20 mEq by mouth daily as needed. With lasix       . raloxifene (EVISTA) 60 MG tablet Take 60 mg by mouth daily.        . ramipril (ALTACE) 10 MG capsule Take 1 capsule (10 mg total) by mouth 2 (two) times daily.  60 capsule  6  . simvastatin (ZOCOR) 20 MG tablet Take 20 mg by mouth at bedtime.        . [DISCONTINUED] celecoxib (CELEBREX) 200 MG capsule Take 200 mg by mouth 2 (two) times daily as needed.         Review of Systems  HENT: Negative.   Eyes: Negative.   Respiratory: Negative.   Cardiovascular: Negative.   Gastrointestinal: Negative.   Musculoskeletal: Positive for arthralgias.  Skin: Negative.   Neurological: Negative.   Psychiatric/Behavioral: Negative.   All other systems reviewed and are negative.    BP 152/82  Pulse 58  Ht 5\' 6"  (1.676 m)  Wt 156 lb (70.761 kg)  BMI 25.19 kg/m2  Physical Exam  Nursing note and vitals reviewed. Constitutional: She is oriented to person, place, and time. She appears well-developed and well-nourished.  HENT:  Head: Normocephalic.  Nose: Nose normal.  Mouth/Throat: Oropharynx is clear and moist.  Eyes: Conjunctivae are normal. Pupils are equal, round, and reactive to light.  Neck: Normal range of motion. Neck supple. No JVD present.  Cardiovascular: Normal rate, regular rhythm, S1 normal, S2 normal, normal heart sounds and intact distal pulses.  Exam reveals no gallop and no friction rub.   No murmur heard. Pulmonary/Chest: Effort normal and breath sounds normal. No respiratory distress. She has no wheezes. She has no rales. She exhibits no tenderness.  Abdominal: Soft. Bowel sounds are normal. She exhibits no distension. There is no tenderness.  Musculoskeletal: Normal range of  motion. She exhibits no edema and no tenderness.  Lymphadenopathy:    She has no cervical adenopathy.  Neurological: She is alert and oriented to person, place, and time. Coordination normal.  Skin: Skin is warm and dry. No rash noted. No erythema.  Psychiatric: She has a normal mood and affect. Her behavior is normal. Judgment and thought content normal.    Assessment and Plan

## 2012-12-23 NOTE — Assessment & Plan Note (Signed)
No signs of heart failure on today's visit. We have suggested she continue on her current medication regimen. She takes Lasix periodically for ankle edema or weight gain

## 2013-03-03 ENCOUNTER — Other Ambulatory Visit: Payer: Self-pay | Admitting: *Deleted

## 2013-03-03 MED ORDER — AMLODIPINE BESYLATE 5 MG PO TABS: ORAL_TABLET | ORAL | Status: DC

## 2013-03-03 NOTE — Telephone Encounter (Signed)
Refilled Amlodpine sent to [facility].

## 2013-03-05 ENCOUNTER — Ambulatory Visit: Payer: Self-pay | Admitting: Internal Medicine

## 2013-03-19 ENCOUNTER — Ambulatory Visit: Payer: Self-pay | Admitting: Orthopaedic Surgery

## 2013-05-06 ENCOUNTER — Other Ambulatory Visit: Payer: Self-pay

## 2013-06-12 ENCOUNTER — Other Ambulatory Visit: Payer: Self-pay

## 2013-06-12 MED ORDER — RAMIPRIL 10 MG PO CAPS: 10.0000 mg | ORAL_CAPSULE | Freq: Two times a day (BID) | ORAL | Status: DC

## 2013-06-12 MED ORDER — CARVEDILOL 25 MG PO TABS: 25.0000 mg | ORAL_TABLET | Freq: Two times a day (BID) | ORAL | Status: DC

## 2013-06-12 NOTE — Telephone Encounter (Signed)
Refill sent for carvedilol 25 mg take one tablet twice a day.  

## 2013-06-12 NOTE — Telephone Encounter (Signed)
Refill sent for ramipril  

## 2013-06-29 ENCOUNTER — Encounter: Payer: Self-pay | Admitting: Orthopaedic Surgery

## 2013-07-01 ENCOUNTER — Encounter: Payer: Self-pay | Admitting: Orthopaedic Surgery

## 2013-08-01 ENCOUNTER — Encounter: Payer: Self-pay | Admitting: Orthopaedic Surgery

## 2013-08-06 ENCOUNTER — Other Ambulatory Visit: Payer: Self-pay

## 2013-08-31 ENCOUNTER — Encounter: Payer: Self-pay | Admitting: Orthopaedic Surgery

## 2013-10-01 ENCOUNTER — Encounter: Payer: Self-pay | Admitting: Orthopaedic Surgery

## 2013-11-06 ENCOUNTER — Other Ambulatory Visit: Payer: Self-pay | Admitting: *Deleted

## 2013-11-06 MED ORDER — AMLODIPINE BESYLATE 5 MG PO TABS: ORAL_TABLET | ORAL | Status: DC

## 2013-11-06 NOTE — Telephone Encounter (Signed)
Requested Prescriptions   Signed Prescriptions Disp Refills  . amLODipine (NORVASC) 5 MG tablet 30 tablet 6    Sig: Take 1/2-1 tablet by mouth daily.    Authorizing Provider: Antonieta Iba    Ordering User: Kendrick Fries

## 2014-02-16 ENCOUNTER — Other Ambulatory Visit: Payer: Self-pay

## 2014-02-16 MED ORDER — CARVEDILOL 25 MG PO TABS: 25.0000 mg | ORAL_TABLET | Freq: Two times a day (BID) | ORAL | Status: DC

## 2014-02-16 NOTE — Telephone Encounter (Signed)
Refill sent for carvedilol 25 mg take one tablet twice a day.

## 2014-02-26 ENCOUNTER — Other Ambulatory Visit: Payer: Self-pay | Admitting: *Deleted

## 2014-02-26 MED ORDER — RAMIPRIL 10 MG PO CAPS: 10.0000 mg | ORAL_CAPSULE | Freq: Two times a day (BID) | ORAL | Status: DC

## 2014-02-26 NOTE — Telephone Encounter (Signed)
Requested Prescriptions   Signed Prescriptions Disp Refills  . ramipril (ALTACE) 10 MG capsule 60 capsule 6    Sig: Take 1 capsule (10 mg total) by mouth 2 (two) times daily.    Authorizing Provider: Antonieta Iba    Ordering User: Kendrick Fries

## 2014-03-08 ENCOUNTER — Ambulatory Visit: Payer: Self-pay | Admitting: Internal Medicine

## 2014-07-15 ENCOUNTER — Other Ambulatory Visit: Payer: Self-pay

## 2014-07-15 MED ORDER — AMLODIPINE BESYLATE 5 MG PO TABS: ORAL_TABLET | ORAL | Status: DC

## 2014-07-15 NOTE — Telephone Encounter (Signed)
Refill sent amlodipine 5 mg

## 2014-07-16 ENCOUNTER — Other Ambulatory Visit: Payer: Self-pay

## 2014-10-21 ENCOUNTER — Other Ambulatory Visit: Payer: Self-pay

## 2014-10-21 MED ORDER — CARVEDILOL 25 MG PO TABS: 25.0000 mg | ORAL_TABLET | Freq: Two times a day (BID) | ORAL | Status: DC

## 2014-10-21 NOTE — Telephone Encounter (Signed)
Refill sent for carvedilol 25 mg  

## 2014-11-17 ENCOUNTER — Encounter: Payer: Self-pay | Admitting: Cardiovascular Disease

## 2014-11-17 ENCOUNTER — Ambulatory Visit (INDEPENDENT_AMBULATORY_CARE_PROVIDER_SITE_OTHER): Payer: Medicare Other | Admitting: Cardiovascular Disease

## 2014-11-17 VITALS — BP 130/68 | HR 56 | Ht 65.5 in | Wt 157.2 lb

## 2014-11-17 DIAGNOSIS — I10 Essential (primary) hypertension: Secondary | ICD-10-CM

## 2014-11-17 DIAGNOSIS — I517 Cardiomegaly: Secondary | ICD-10-CM

## 2014-11-17 DIAGNOSIS — I429 Cardiomyopathy, unspecified: Secondary | ICD-10-CM

## 2014-11-17 DIAGNOSIS — E785 Hyperlipidemia, unspecified: Secondary | ICD-10-CM

## 2014-11-17 NOTE — Assessment & Plan Note (Signed)
Blood pressure is well controlled on today's visit. No changes made to the medications. 

## 2014-11-17 NOTE — Progress Notes (Signed)
Patient ID: Robin Ortiz, female    DOB: 11/30/1934, 80 y.o.   MRN: [id number]  HPI Comments: Robin Ortiz is a pleasant 80 year-old woman with a history of hypertension, hyperlipidemia, remote history of nonischemic cardiomyopathy with initial ejection fraction 20-30% in 2002 that has improved to 50-55% on echocardiogram in March 2009, status post bilateral knee replacement who presents for routine follow up  Of her cardiomyopathy.  In follow-up today, Robin Ortiz reports that Robin Ortiz is doing well. Robin Ortiz has had foot surgery for ruptured tendon. Blood pressure typically within excellent range. Recent lab work showing total cholesterol 155, LDL 72 Not doing her regular exercise but very active at baseline Many grandchildren to play with Robin Ortiz takes Lasix and potassium periodically for ankle edema on the left  EKG on today's visit shows normal sinus rhythm with rate 57 bpm, no significant ST or T-wave changes  Other past medical history Robin Ortiz did a lifeline screening in the past that showed no significant carotid arterial disease, no AAA, no significant PAD. Robin Ortiz is low risk for osteoporosis   Remote cardiac catheterization in 2002 showing mild RCA disease at the ostium.    No Known Allergies  Outpatient Encounter Prescriptions as of 11/17/2014  Medication Sig  . amLODipine (NORVASC) 5 MG tablet Take 1/2-1 tablet by mouth daily.  Marland Kitchen aspirin 81 MG tablet Take 81 mg by mouth daily.    . Calcium-Vitamin D 600-200 MG-UNIT per tablet Take 1 tablet by mouth daily.    . carvedilol (COREG) 25 MG tablet Take 1 tablet (25 mg total) by mouth 2 (two) times daily with a meal.  . fexofenadine (ALLEGRA) 180 MG tablet Take 180 mg by mouth daily as needed. 1/2 to 1 tablet qd  . furosemide (LASIX) 20 MG tablet Take 20 mg by mouth as needed.   . Misc Natural Products (OSTEO BI-FLEX ADV JOINT SHIELD PO) Take 1 tablet by mouth daily.    . Multiple Vitamin (MULTIVITAMIN) tablet Take 1 tablet by mouth daily.    . Omega-3  Fatty Acids (FISH OIL) 1000 MG CAPS Take 1 capsule by mouth daily.    . potassium chloride (KLOR-CON) 20 MEQ packet Take 20 mEq by mouth daily as needed. With lasix   . raloxifene (EVISTA) 60 MG tablet Take 60 mg by mouth daily.    . ramipril (ALTACE) 10 MG capsule Take 1 capsule (10 mg total) by mouth 2 (two) times daily.  . simvastatin (ZOCOR) 20 MG tablet Take 20 mg by mouth at bedtime.      Past Medical History  Diagnosis Date  . Cardiomegaly   . Other and unspecified hyperlipidemia   . Unspecified essential hypertension   . Secondary cardiomyopathy, unspecified     Past Surgical History  Procedure Laterality Date  . Total knee arthroplasty    . Rotator cuff repair      left shoulder    Social History  reports that Robin Ortiz has never smoked. Robin Ortiz does not have any smokeless tobacco history on file. Robin Ortiz reports that Robin Ortiz drinks about 0.6 oz of alcohol per week. Robin Ortiz reports that Robin Ortiz does not use illicit drugs.  Family History family history includes Heart disease in her father.   Review of Systems  HENT: Negative.   Eyes: Negative.   Respiratory: Negative.   Cardiovascular: Negative.   Gastrointestinal: Negative.   Musculoskeletal: Positive for arthralgias.  Skin: Negative.   Neurological: Negative.   Psychiatric/Behavioral: Negative.   All other systems reviewed and are negative.  BP 130/68 mmHg  Pulse 56  Ht 5' 5.5" (1.664 m)  Wt 157 lb 4 oz (71.328 kg)  BMI 25.76 kg/m2  Physical Exam  Constitutional: Robin Ortiz is oriented to person, place, and time. Robin Ortiz appears well-developed and well-nourished.  HENT:  Head: Normocephalic.  Nose: Nose normal.  Mouth/Throat: Oropharynx is clear and moist.  Eyes: Conjunctivae are normal. Pupils are equal, round, and reactive to light.  Neck: Normal range of motion. Neck supple. No JVD present.  Cardiovascular: Normal rate, regular rhythm, S1 normal, S2 normal, normal heart sounds and intact distal pulses.  Exam reveals no gallop and no  friction rub.   No murmur heard. Pulmonary/Chest: Effort normal and breath sounds normal. No respiratory distress. Robin Ortiz has no wheezes. Robin Ortiz has no rales. Robin Ortiz exhibits no tenderness.  Abdominal: Soft. Bowel sounds are normal. Robin Ortiz exhibits no distension. There is no tenderness.  Musculoskeletal: Normal range of motion. Robin Ortiz exhibits no edema or tenderness.  Lymphadenopathy:    Robin Ortiz has no cervical adenopathy.  Neurological: Robin Ortiz is alert and oriented to person, place, and time. Coordination normal.  Skin: Skin is warm and dry. No rash noted. No erythema.  Psychiatric: Robin Ortiz has a normal mood and affect. Her behavior is normal. Judgment and thought content normal.    Assessment and Plan  Nursing note and vitals reviewed.

## 2014-11-17 NOTE — Assessment & Plan Note (Signed)
Cholesterol is at goal on the current lipid regimen. No changes to the medications were made.  

## 2014-11-17 NOTE — Assessment & Plan Note (Signed)
She appears relatively euvolemic on today's visit No changes to her medications 

## 2014-11-17 NOTE — Patient Instructions (Signed)
You are doing well. No medication changes were made.  Please call us if you have new issues that need to be addressed before your next appt.  Your physician wants you to follow-up in: 12 months.  You will receive a reminder letter in the mail two months in advance. If you don't receive a letter, please call our office to schedule the follow-up appointment. 

## 2015-01-26 ENCOUNTER — Other Ambulatory Visit: Payer: Self-pay | Admitting: *Deleted

## 2015-01-26 MED ORDER — CARVEDILOL 25 MG PO TABS: 25.0000 mg | ORAL_TABLET | Freq: Two times a day (BID) | ORAL | Status: DC

## 2015-02-22 ENCOUNTER — Ambulatory Visit: Payer: Medicare Other | Attending: Orthopaedic Surgery

## 2015-02-22 VITALS — BP 145/61 | HR 55

## 2015-02-22 DIAGNOSIS — R531 Weakness: Secondary | ICD-10-CM | POA: Diagnosis not present

## 2015-02-22 DIAGNOSIS — M76822 Posterior tibial tendinitis, left leg: Secondary | ICD-10-CM | POA: Diagnosis present

## 2015-02-22 DIAGNOSIS — M79672 Pain in left foot: Secondary | ICD-10-CM

## 2015-02-22 NOTE — Patient Instructions (Addendum)
Quadriceps Set (Prone)   With toes supporting lower legs, tighten thigh muscles to straighten knees. Squeeze rear-end muscles.  Hold ___5_ seconds. Relax. Repeat __10__ times per set. Do _3___ sets per session. Do _1___ sessions per day.  http://orth.exer.us/727   Copyright  VHI. All rights reserved.    HIP: Abduction - Side-Lying (Weight)    Squeeze glutes. Raise top leg up and slightly back. Hold __0_ seconds. Use __0 lb weight. _10__ reps per set, __3_ sets per day. Bend bottom leg to stabilize pelvis.  Copyright  VHI. All rights reserved.  EXTERNAL ROTATION: Sitting (Active)   Sit, feet flat. Lift left knee and move foot inward toward opposite knee. Use __0_ lbs. Hold for 5 seconds. Complete _3__ sets of __10_ repetitions. Perform __1_ sessions per day.  Copyright  VHI. All rights reserved.    Improved exercise technique, movement at target joints, use of target muscles after mod verbal, visual, tactile cues.

## 2015-02-22 NOTE — Therapy (Addendum)
[facility] [address], [place], [address] Phone: [phone]   Fax:  [phone]  Physical Therapy Evaluation  Patient Details  Name: Robin Ortiz MRN: [id number] Date of Birth: Mar 02, 1935 Referring Provider:  Vanita Ingles, MD  Encounter Date: 02/22/2015      PT End of Session - 03/01/15 1506    Visit Number 3   Number of Visits 13   Date for PT Re-Evaluation 04/05/15   Authorization Type 3   Authorization Time Period of 10   PT Start Time 1502   PT Stop Time 1603   PT Time Calculation (min) 61 min   Activity Tolerance Patient tolerated treatment well   Behavior During Therapy [facility] for tasks assessed/performed      Past Medical History  Diagnosis Date  . Cardiomegaly   . Other and unspecified hyperlipidemia   . Unspecified essential hypertension   . Secondary cardiomyopathy, unspecified   . Osteoporosis   . CHF (congestive heart failure)     Past Surgical History  Procedure Laterality Date  . Total knee arthroplasty Bilateral   . Rotator cuff repair      left shoulder  . Ruptured tendon right foot Right     surgical repair    Filed Vitals:   02/22/15 1643  BP: 145/61  Pulse: 55    Visit Diagnosis:  Tibial tendonitis, posterior, left - Plan: PT plan of care cert/re-cert  Left foot pain - Plan: PT plan of care cert/re-cert  Weakness - Plan: PT plan of care cert/re-cert      Subjective Assessment - 03/01/15 1507    Subjective Swelling at end of the day is less. The true test is when she goes to her gradnson's graduation next week.    Patient Stated Goals Patient states that she wants to prevent surgery for her foot. Build up her energy level   Currently in Pain? No/denies                 [facility] Adult PT Treatment/Exercise - 03/01/15 1509    Knee/Hip Exercises: Standing   Other Standing Knee Exercises Directed patient with standing L hip abduction 10x3 with 5  second holds, standing L hip extension/abduction 10x3,  standing ankle wobble board DF/PF 2 min x2, SLS on L LE with R tip toe and finger touch assist 5x5 seconds. forward step ups onto 3 inch step leading with L LE 6x   Other Standing Knee Exercises Patient education with posterior tibialis tendon and heel lift.    Knee/Hip Exercises: Sidelying   Other Sidelying Knee Exercises S/L hip abduction 10x3 each LE.    Manual Therapy   Manual therapy comments supine P to A to L ankle joint grade 3. PROM L ankle plantar flexion and toe flexion (to improve ability to curl toes and use intrinsic muscles of foot), A tp P to L navicular/st cuneiform joint grade 3           PT Education - 03/01/15 1605    Education provided Yes   Education Details Ther-ex   Person(s) Educated Patient   Methods Explanation;Demonstration;Tactile cues;Verbal cues   Comprehension Verbalized understanding;Returned demonstration            PT Long Term Goals - 02/22/15 2108    PT LONG TERM GOAL #1   Title Patient will be independent with her HEP to improve hip strength, femoral control, and decrease L foot discomfort.    Time 6  Status New   PT LONG TERM GOAL #2   Title Patient will improve L LE strength by 1/2 MMT grade to promote femoral control and decrease L foot discomfort.    Time 6   Status New   PT LONG TERM GOAL #3   Title Patient will improve her LEFS score by at least 5 points as a demonstration of improved function.    Time 6   Period Weeks   Status New   PT LONG TERM GOAL #4   Title Patient will be able to negotiate stairs or pick up items from the floor (squat) with demonstration of good femoral control to decrease pressure on posterior tibialis tendon from foot pronation.    Time 6   Period Weeks   Status New   PT LONG TERM GOAL #5   Title Patient will improve L ankle DF AROM to at least 10 degrees to promote gastroc/soleus and postierior tibialis tendon flexibility and decrease stress on  the posterior tibialis tendon from talus and navicular bones.   Time 6   Period Weeks   Status New           Plan - 2015-03-25 1606    Clinical Impression Statement Patient tolerated session well without aggravation of symptoms. Slight increase in L foot discomfort after forward step ups onto 3 inch step with R LE which decreased with rest. L foot might not be ready for stair related exercises yet.    Pt will benefit from skilled therapeutic intervention in order to improve on the following deficits Decreased range of motion;Difficulty walking;Pain;Decreased balance;Improper body mechanics;Decreased strength;Other (comment)   Rehab Potential Good   Clinical Impairments Affecting Rehab Potential no known impairments affecting rehab potential   PT Frequency 2x / week   PT Treatment/Interventions Ultrasound;Neuromuscular re-education;Gait training;Cryotherapy;Patient/family education;Electrical Stimulation;Taping;Therapeutic activities;Iontophoresis /ml Dexamethasone;Moist Heat;Therapeutic exercise;Manual techniques;Passive range of motion;ADLs/Self Care Home Management;Balance training   PT Next Visit Plan hip strengthening, femoral control   Consulted and Agree with Plan of Care Patient          G-Codes - 03/25/15 1624    Functional Limitation Mobility: Walking and moving around   Mobility: Walking and Moving Around Current Status [phone]) At least 20 percent but less than 40 percent impaired, limited or restricted   Mobility: Walking and Moving Around Goal Status [phone]) At least 1 percent but less than 20 percent impaired, limited or restricted       Problem List Patient Active Problem List   Diagnosis Date Noted  . Secondary cardiomyopathy 12/14/2010  . Hyperlipidemia 05/03/2010  . HYPERTENSION, BENIGN 05/03/2010  . CARDIOMEGALY 05/03/2010    Charod Slawinski 03-25-15, 4:26 PM  [facility] [address], [place], [address] Phone: [phone]   Fax:  [phone]

## 2015-02-24 ENCOUNTER — Ambulatory Visit: Payer: Medicare Other

## 2015-02-24 DIAGNOSIS — M79672 Pain in left foot: Secondary | ICD-10-CM

## 2015-02-24 DIAGNOSIS — R531 Weakness: Secondary | ICD-10-CM

## 2015-02-24 DIAGNOSIS — M76822 Posterior tibial tendinitis, left leg: Secondary | ICD-10-CM

## 2015-02-24 NOTE — Patient Instructions (Signed)
Improved exercise technique, movement at target joints, use of target muscles after mod verbal, visual, tactile cues.   

## 2015-02-24 NOTE — Therapy (Signed)
[place] [facility] [address], [place], [address] Phone: [phone]   Fax:  [phone]  Physical Therapy Treatment  Patient Details  Name: Robin Ortiz MRN: [id number] Date of Birth: [birth year] Referring Provider:  Vanita Ingles, MD  Encounter Date: 02/24/2015      PT End of Session - 02/24/15 0958    Visit Number 2   Number of Visits 13   Authorization Type 2   Authorization Time Period of 10   PT Start Time 0958   PT Stop Time 1106   PT Time Calculation (min) 68 min   Activity Tolerance Patient tolerated treatment well   Behavior During Therapy [facility] for tasks assessed/performed      Past Medical History  Diagnosis Date  . Cardiomegaly   . Other and unspecified hyperlipidemia   . Unspecified essential hypertension   . Secondary cardiomyopathy, unspecified   . Osteoporosis   . CHF (congestive heart failure)     Past Surgical History  Procedure Laterality Date  . Total knee arthroplasty Bilateral   . Rotator cuff repair      left shoulder  . Ruptured tendon right foot Right     surgical repair    There were no vitals filed for this visit.  Visit Diagnosis:  Left foot pain  Tibial tendonitis, posterior, left  Weakness      Subjective Assessment - 02/24/15 1001    Subjective Patient states that she's been doing her exercises at home. Wants to go over the prone glute/quad set exercise. L foot does not really swell in the morning. It mainly swells during the evening. Will put ice on L foot when she gets home.    Patient Stated Goals Patient states that she wants to prevent surgery for her foot. Build up her energy level   Currently in Pain? No/denies           [facility] Adult PT Treatment/Exercise - 02/24/15 1019    Knee/Hip Exercises: Standing   Other Standing Knee Exercises Directed patient with standing L hip abduction 10x3 with 5 second holds, standing L hip extension/abduction  10x3 with 5 second holds, standing ankle wobble board DF/PF 2 min   Knee/Hip Exercises: Sidelying   Other Sidelying Knee Exercises S/L hip abduction 10x3 each LE.    Knee/Hip Exercises: Prone   Other Prone Exercises Directed patient with prone glute max/quad sets 10x2 with 5 second holds each LE,    Manual Therapy   Manual therapy comments supine P to A to L ankle joint grade 3. PROM L ankle plantar flexion and toe flexion (to improve ability to curl toes and use intrinsic muscles of foot)          PT Long Term Goals - 02/22/15 2108    PT LONG TERM GOAL #1   Title Patient will be independent with her HEP to improve hip strength, femoral control, and decrease L foot discomfort.    Time 6   Status New   PT LONG TERM GOAL #2   Title Patient will improve L LE strength by 1/2 MMT grade to promote femoral control and decrease L foot discomfort.    Time 6   Status New   PT LONG TERM GOAL #3   Title Patient will improve her LEFS score by at least 5 points as a demonstration of improved function.    Time 6   Period Weeks   Status New   PT LONG TERM  GOAL #4   Title Patient will be able to negotiate stairs or pick up items from the floor (squat) with demonstration of good femoral control to decrease pressure on posterior tibialis tendon from foot pronation.    Time 6   Period Weeks   Status New   PT LONG TERM GOAL #5   Title Patient will improve L ankle DF AROM to at least 10 degrees to promote gastroc/soleus and postierior tibialis tendon flexibility and decrease stress on the posterior tibialis tendon from talus and navicular bones.   Time 6   Period Weeks   Status New            Plan - 02/24/15 1107    Clinical Impression Statement Patient tolerated session well without aggravation of symptoms. Improved ability to curl toes after manual therapy. Good glute med and max muscle contraction felt with exercises.    Pt will benefit from skilled therapeutic intervention in order to  improve on the following deficits Decreased range of motion;Difficulty walking;Pain;Decreased balance;Improper body mechanics;Decreased strength;Other (comment)   Rehab Potential Good   Clinical Impairments Affecting Rehab Potential no known impairments affecting rehab potential   PT Frequency 2x / week   PT Duration 6 weeks   PT Treatment/Interventions Ultrasound;Neuromuscular re-education;Gait training;Cryotherapy;Patient/family education;Electrical Stimulation;Taping;Therapeutic activities;Iontophoresis 4mg /ml Dexamethasone;Moist Heat;Therapeutic exercise;Manual techniques;Passive range of motion;ADLs/Self Care Home Management;Balance training   PT Next Visit Plan hip strengthening, femoral control   Consulted and Agree with Plan of Care Patient      Problem List Patient Active Problem List   Diagnosis Date Noted  . Secondary cardiomyopathy 12/14/2010  . Hyperlipidemia 05/03/2010  . HYPERTENSION, BENIGN 05/03/2010  . CARDIOMEGALY 05/03/2010    Robin Ortiz 02/24/2015, 7:30 PM  [facility] [address], [place], [address] Phone: [phone]   Fax:  [phone]

## 2015-03-01 ENCOUNTER — Ambulatory Visit: Payer: Medicare Other

## 2015-03-01 DIAGNOSIS — M76822 Posterior tibial tendinitis, left leg: Secondary | ICD-10-CM | POA: Diagnosis not present

## 2015-03-01 DIAGNOSIS — R531 Weakness: Secondary | ICD-10-CM

## 2015-03-01 DIAGNOSIS — M79672 Pain in left foot: Secondary | ICD-10-CM

## 2015-03-01 NOTE — Therapy (Signed)
[place] [facility] [address] [place], [address] Phone: [phone]   Fax:  [phone]  Physical Therapy Treatment  Patient Details  Name: Robin Ortiz MRN: [id number] Date of Birth: [birth year] Referring Provider:  Vanita Ingles, MD  Encounter Date: 03/01/2015      PT End of Session - 03/01/15 1506    Visit Number 3   Number of Visits 13   Date for PT Re-Evaluation 04/05/15   Authorization Type 3   Authorization Time Period of 10   PT Start Time 1502   PT Stop Time 1603   PT Time Calculation (min) 61 min   Activity Tolerance Patient tolerated treatment well   Behavior During Therapy [facility] for tasks assessed/performed      Past Medical History  Diagnosis Date  . Cardiomegaly   . Other and unspecified hyperlipidemia   . Unspecified essential hypertension   . Secondary cardiomyopathy, unspecified   . Osteoporosis   . CHF (congestive heart failure)     Past Surgical History  Procedure Laterality Date  . Total knee arthroplasty Bilateral   . Rotator cuff repair      left shoulder  . Ruptured tendon right foot Right     surgical repair    There were no vitals filed for this visit.  Visit Diagnosis:  Left foot pain  Tibial tendonitis, posterior, left  Weakness      Subjective Assessment - 03/01/15 1507    Subjective Swelling at end of the day is less. The true test is when she goes to her gradnson's graduation next week.    Patient Stated Goals Patient states that she wants to prevent surgery for her foot. Build up her energy level   Currently in Pain? No/denies            [facility] Adult PT Treatment/Exercise - 03/01/15 1509    Knee/Hip Exercises: Standing   Other Standing Knee Exercises Directed patient with standing L hip abduction 10x3 with 5 second holds, standing L hip extension/abduction 10x3,  standing ankle wobble board DF/PF 2 min x2, SLS on L LE with R tip toe and finger  touch assist 5x5 seconds. forward step ups onto 3 inch step leading with L LE 6x   Other Standing Knee Exercises Patient education with posterior tibialis tendon and heel lift.    Knee/Hip Exercises: Sidelying   Other Sidelying Knee Exercises S/L hip abduction 10x3 each LE.    Manual Therapy   Manual therapy comments supine P to A to L ankle joint grade 3. PROM L ankle plantar flexion and toe flexion (to improve ability to curl toes and use intrinsic muscles of foot), A to P to L navicular/1st cuneiform joint grade 3           PT Education - 03/01/15 1605    Education provided Yes   Education Details Ther-ex   Person(s) Educated Patient   Methods Explanation;Demonstration;Tactile cues;Verbal cues   Comprehension Verbalized understanding;Returned demonstration             PT Long Term Goals - 02/22/15 2108    PT LONG TERM GOAL #1   Title Patient will be independent with her HEP to improve hip strength, femoral control, and decrease L foot discomfort.    Time 6   Status New   PT LONG TERM GOAL #2   Title Patient will improve L LE strength by 1/2 MMT grade to promote femoral control and decrease L  foot discomfort.    Time 6   Status New   PT LONG TERM GOAL #3   Title Patient will improve her LEFS score by at least 5 points as a demonstration of improved function.    Time 6   Period Weeks   Status New   PT LONG TERM GOAL #4   Title Patient will be able to negotiate stairs or pick up items from the floor (squat) with demonstration of good femoral control to decrease pressure on posterior tibialis tendon from foot pronation.    Time 6   Period Weeks   Status New   PT LONG TERM GOAL #5   Title Patient will improve L ankle DF AROM to at least 10 degrees to promote gastroc/soleus and postierior tibialis tendon flexibility and decrease stress on the posterior tibialis tendon from talus and navicular bones.   Time 6   Period Weeks   Status New           Plan - 03/01/15  1606    Clinical Impression Statement Patient tolerated session well without aggravation of symptoms. Slight increase in L foot discomfort after forward step ups onto 3 inch step with R LE which decreased with rest. L foot might not be ready for stair related exercises yet.    Pt will benefit from skilled therapeutic intervention in order to improve on the following deficits Decreased range of motion;Difficulty walking;Pain;Decreased balance;Improper body mechanics;Decreased strength;Other (comment)   Rehab Potential Good   Clinical Impairments Affecting Rehab Potential no known impairments affecting rehab potential   PT Frequency 2x / week   PT Treatment/Interventions Ultrasound;Neuromuscular re-education;Gait training;Cryotherapy;Patient/family education;Electrical Stimulation;Taping;Therapeutic activities;Iontophoresis 4mg /ml Dexamethasone;Moist Heat;Therapeutic exercise;Manual techniques;Passive range of motion;ADLs/Self Care Home Management;Balance training   PT Next Visit Plan hip strengthening, femoral control   Consulted and Agree with Plan of Care Patient        Problem List Patient Active Problem List   Diagnosis Date Noted  . Secondary cardiomyopathy 12/14/2010  . Hyperlipidemia 05/03/2010  . HYPERTENSION, BENIGN 05/03/2010  . CARDIOMEGALY 05/03/2010    Robin Ortiz 03/01/2015, 4:15 PM  [facility] [address] [place], [address] Phone: [phone]   Fax:  [phone]

## 2015-03-01 NOTE — Patient Instructions (Signed)
Improved exercise technique, movement at target joints, use of target muscles after mod verbal, visual, tactile cues.   

## 2015-03-03 ENCOUNTER — Ambulatory Visit: Payer: Medicare Other | Attending: Orthopaedic Surgery

## 2015-03-03 DIAGNOSIS — M76822 Posterior tibial tendinitis, left leg: Secondary | ICD-10-CM | POA: Diagnosis present

## 2015-03-03 DIAGNOSIS — M79672 Pain in left foot: Secondary | ICD-10-CM

## 2015-03-03 DIAGNOSIS — R531 Weakness: Secondary | ICD-10-CM | POA: Insufficient documentation

## 2015-03-04 NOTE — Patient Instructions (Signed)
Patient was instructed to continue doing her HEP. Patient verbalized understanding.

## 2015-03-04 NOTE — Therapy (Signed)
[place] [facility] [address] [place], [address] Phone: [phone]   Fax:  [phone]  Physical Therapy Treatment  Patient Details  Name: Robin Ortiz MRN: [id number] Date of Birth: 10/02/1934 Referring Provider:  Vanita Ingles, MD  Encounter Date: 03/03/2015      PT End of Session - 03/03/15 1441    Visit Number 4   Number of Visits 13   Date for PT Re-Evaluation 04/05/15   Authorization Type 4   Authorization Time Period of 10   PT Start Time 1441   PT Stop Time 1507   PT Time Calculation (min) 26 min   Activity Tolerance Patient tolerated treatment well;No increased pain   Behavior During Therapy [facility] for tasks assessed/performed      Past Medical History  Diagnosis Date  . Cardiomegaly   . Other and unspecified hyperlipidemia   . Unspecified essential hypertension   . Secondary cardiomyopathy, unspecified   . Osteoporosis   . CHF (congestive heart failure)     Past Surgical History  Procedure Laterality Date  . Total knee arthroplasty Bilateral   . Rotator cuff repair      left shoulder  . Ruptured tendon right foot Right     surgical repair    There were no vitals filed for this visit.  Visit Diagnosis:  Left foot pain  Tibial tendonitis, posterior, left  Weakness      Subjective Assessment - 03/03/15 1450    Subjective Patient states that her L foot was a little puffy Tuesday evening but not bad.    Patient Stated Goals Patient states that she wants to prevent surgery for her foot. Build up her energy level   Currently in Pain? No/denies            [facility] Adult PT Treatment/Exercise - 03/03/15 1508    Manual Therapy   Manual therapy comments  A to P to L ankle joint grade 3. PROM L ankle plantar flexion and toe flexion (to improve ability to curl toes and use intrinsic muscles of foot), A to P and P to A to L navicular/1st cuneiform joint grade 3. manual L calf stretch  30 seconds x 5           PT Education - 03/04/15 1151    Education provided Yes   Education Details HEP   Person(s) Educated Patient   Methods Explanation   Comprehension Verbalized understanding             PT Long Term Goals - 02/22/15 2108    PT LONG TERM GOAL #1   Title Patient will be independent with her HEP to improve hip strength, femoral control, and decrease L foot discomfort.    Time 6   Status New   PT LONG TERM GOAL #2   Title Patient will improve L LE strength by 1/2 MMT grade to promote femoral control and decrease L foot discomfort.    Time 6   Status New   PT LONG TERM GOAL #3   Title Patient will improve her LEFS score by at least 5 points as a demonstration of improved function.    Time 6   Period Weeks   Status New   PT LONG TERM GOAL #4   Title Patient will be able to negotiate stairs or pick up items from the floor (squat) with demonstration of good femoral control to decrease pressure on posterior tibialis tendon from foot pronation.  Time 6   Period Weeks   Status New   PT LONG TERM GOAL #5   Title Patient will improve L ankle DF AROM to at least 10 degrees to promote gastroc/soleus and postierior tibialis tendon flexibility and decrease stress on the posterior tibialis tendon from talus and navicular bones.   Time 6   Period Weeks   Status New               Plan - 03/04/15 1152    Clinical Impression Statement Improved mobility at talo crural as well as 1st cuneiform and navicular joints. Improved ability to curl toes.    Pt will benefit from skilled therapeutic intervention in order to improve on the following deficits Decreased range of motion;Difficulty walking;Pain;Decreased balance;Improper body mechanics;Decreased strength   Rehab Potential Good   Clinical Impairments Affecting Rehab Potential no known impairments affecting rehab potential   PT Frequency 2x / week   PT Duration 6 weeks   PT Treatment/Interventions  Ultrasound;Neuromuscular re-education;Gait training;Cryotherapy;Patient/family education;Electrical Stimulation;Taping;Therapeutic activities;Iontophoresis 4mg /ml Dexamethasone;Moist Heat;Therapeutic exercise;Manual techniques;Passive range of motion;ADLs/Self Care Home Management;Balance training   PT Next Visit Plan hip strengthening, femoral control   Consulted and Agree with Plan of Care Patient        Problem List Patient Active Problem List   Diagnosis Date Noted  . Secondary cardiomyopathy 12/14/2010  . Hyperlipidemia 05/03/2010  . HYPERTENSION, BENIGN 05/03/2010  . CARDIOMEGALY 05/03/2010    Sheridan Hew 03/04/2015, 11:56 AM  [place] [facility] [address], [place], [address] Phone: [phone]   Fax:  [phone]

## 2015-03-07 ENCOUNTER — Ambulatory Visit: Payer: Medicare Other

## 2015-03-07 DIAGNOSIS — M79672 Pain in left foot: Secondary | ICD-10-CM | POA: Diagnosis not present

## 2015-03-07 DIAGNOSIS — M76822 Posterior tibial tendinitis, left leg: Secondary | ICD-10-CM

## 2015-03-07 DIAGNOSIS — R531 Weakness: Secondary | ICD-10-CM

## 2015-03-07 NOTE — Patient Instructions (Addendum)
Patient was instructed to wear the low dye tape (supporting the arch of L her foot) the rest of the day. However, if her symptoms increase, remove the tape. Patient verbalized understanding.   Improved exercise technique, movement at target joints, use of target muscles after mod verbal, visual, tactile cues.

## 2015-03-07 NOTE — Therapy (Signed)
[place] [facility] [address] [place], [address] Phone: [phone]   Fax:  [phone]  Physical Therapy Treatment  Patient Details  Name: Robin Ortiz MRN: [id number] Date of Birth: [birth year] Referring Provider:  Vanita Ingles, MD  Encounter Date: 03/07/2015      PT End of Session - 03/07/15 0952    Visit Number 5   Number of Visits 13   Date for PT Re-Evaluation 04/05/15   Authorization Type 5   Authorization Time Period of 10   PT Start Time 0952   PT Stop Time 1103   PT Time Calculation (min) 71 min   Activity Tolerance Patient tolerated treatment well   Behavior During Therapy [facility] for tasks assessed/performed      Past Medical History  Diagnosis Date  . Cardiomegaly   . Other and unspecified hyperlipidemia   . Unspecified essential hypertension   . Secondary cardiomyopathy, unspecified   . Osteoporosis   . CHF (congestive heart failure)     Past Surgical History  Procedure Laterality Date  . Total knee arthroplasty Bilateral   . Rotator cuff repair      left shoulder  . Ruptured tendon right foot Right     surgical repair    There were no vitals filed for this visit.  Visit Diagnosis:  Tibial tendonitis, posterior, left  Left foot pain  Weakness      Subjective Assessment - 03/07/15 1016    Subjective Patient states that her L foot swelled up this past Saturday. Did a lot of activities. Sunday was not that bad. Just puffy.    Patient Stated Goals Patient states that she wants to prevent surgery for her foot. Build up her energy level   Currently in Pain? Yes   Pain Score 1    Pain Location --  ankle and foot   Pain Orientation Left   Pain Descriptors / Indicators Throbbing   Pain Onset In the past 7 days   Multiple Pain Sites No          [facility] Adult PT Treatment/Exercise - 03/07/15 1027    Knee/Hip Exercises: Standing   Other Standing Knee Exercises standing L hip  abduction 10x3 with 5 second holds, walking around gym 300 ft (with low dye tape on L foot to support arch), side step against black resistive tube 5x to the R.    Knee/Hip Exercises: Seated   Other Seated Knee Exercises Directed patient with seated manually resisted L ankle DF 10x3, EV 10x3, seated L hip ER active 10x3, seated bilateral hip adduction isometrics 10x3 with 5 seconds, seated L LE neural floss 10x   Cryotherapy   Number Minutes Cryotherapy 15 Minutes   Cryotherapy Location Ankle   Type of Cryotherapy Ice pack   Manual Therapy   Manual therapy comments low dye tape L foot           PT Education - 03/07/15 1955    Education provided Yes   Education Details Ther-ex, low dye tape   Person(s) Educated Patient   Methods Explanation;Demonstration;Tactile cues;Verbal cues   Comprehension Returned demonstration;Verbalized understanding          PT Long Term Goals - 02/22/15 2108    PT LONG TERM GOAL #1   Title Patient will be independent with her HEP to improve hip strength, femoral control, and decrease L foot discomfort.    Time 6   Status New   PT LONG TERM  GOAL #2   Title Patient will improve L LE strength by 1/2 MMT grade to promote femoral control and decrease L foot discomfort.    Time 6   Status New   PT LONG TERM GOAL #3   Title Patient will improve her LEFS score by at least 5 points as a demonstration of improved function.    Time 6   Period Weeks   Status New   PT LONG TERM GOAL #4   Title Patient will be able to negotiate stairs or pick up items from the floor (squat) with demonstration of good femoral control to decrease pressure on posterior tibialis tendon from foot pronation.    Time 6   Period Weeks   Status New   PT LONG TERM GOAL #5   Title Patient will improve L ankle DF AROM to at least 10 degrees to promote gastroc/soleus and postierior tibialis tendon flexibility and decrease stress on the posterior tibialis tendon from talus and navicular  bones.   Time 6   Period Weeks   Status New           Plan - 03/07/15 1049    Clinical Impression Statement Patient states feeling throbbing which comes and goes throughout session. Decrased throbbing after application of ice to L foot in supine. Throbbing decreases during non-weightbeaing movements as well as ankle DF and EV.    Pt will benefit from skilled therapeutic intervention in order to improve on the following deficits Decreased range of motion;Difficulty walking;Pain;Decreased balance;Improper body mechanics;Decreased strength   Rehab Potential Good   PT Frequency 2x / week   PT Duration 6 weeks   PT Treatment/Interventions Ultrasound;Neuromuscular re-education;Gait training;Cryotherapy;Patient/family education;Electrical Stimulation;Taping;Therapeutic activities;Iontophoresis 4mg /ml Dexamethasone;Moist Heat;Therapeutic exercise;Manual techniques;Passive range of motion;ADLs/Self Care Home Management;Balance training   PT Next Visit Plan hip strengthening, femoral control   Consulted and Agree with Plan of Care Patient        Problem List Patient Active Problem List   Diagnosis Date Noted  . Secondary cardiomyopathy 12/14/2010  . Hyperlipidemia 05/03/2010  . HYPERTENSION, BENIGN 05/03/2010  . CARDIOMEGALY 05/03/2010    Zachery Niswander 03/07/2015, 8:09 PM  [place] [facility] [address] [place], [address] Phone: [phone]   Fax:  [phone]

## 2015-03-09 ENCOUNTER — Ambulatory Visit: Payer: Medicare Other

## 2015-03-09 DIAGNOSIS — M79672 Pain in left foot: Secondary | ICD-10-CM

## 2015-03-09 DIAGNOSIS — R531 Weakness: Secondary | ICD-10-CM

## 2015-03-09 DIAGNOSIS — M76822 Posterior tibial tendinitis, left leg: Secondary | ICD-10-CM

## 2015-03-09 NOTE — Therapy (Signed)
[place] [facility] [address] [place], [address] Phone: [phone]   Fax:  [phone]  Physical Therapy Treatment  Patient Details  Name: Robin Ortiz MRN: [id number] Date of Birth: 04-25-1935 Referring Provider:  Vanita Ingles, MD  Encounter Date: 03/09/2015      PT End of Session - 03/09/15 0954    Visit Number 6   Number of Visits 13   Date for PT Re-Evaluation 04/05/15   Authorization Type 6   Authorization Time Period of 10   PT Start Time 0954   PT Stop Time 1031   PT Time Calculation (min) 37 min   Activity Tolerance Patient tolerated treatment well   Behavior During Therapy [facility] for tasks assessed/performed      Past Medical History  Diagnosis Date  . Cardiomegaly   . Other and unspecified hyperlipidemia   . Unspecified essential hypertension   . Secondary cardiomyopathy, unspecified   . Osteoporosis   . CHF (congestive heart failure)     Past Surgical History  Procedure Laterality Date  . Total knee arthroplasty Bilateral   . Rotator cuff repair      left shoulder  . Ruptured tendon right foot Right     surgical repair    There were no vitals filed for this visit.  Visit Diagnosis:  Tibial tendonitis, posterior, left  Left foot pain  Weakness      Subjective Assessment - 03/09/15 0958    Subjective Patient states she does not think the tape (low dye taping to support arch) really helped. Had to take the tape off early because she had to put ointment on her foot.  The tape also caused redness on her skin becuase of the adhesives. No ankle pain today.   (pt wanted to try taping the other session)   Patient Stated Goals Patient states that she wants to prevent surgery for her foot. Build up her energy level   Currently in Pain? No/denies   Multiple Pain Sites No           [facility] Adult PT Treatment/Exercise - 03/09/15 0001    Knee/Hip Exercises: Standing   Other  Standing Knee Exercises Directed patient with standing L gastroc stretch 30 seconds x 3   Knee/Hip Exercises: Seated   Other Seated Knee Exercises Directed patient with seated soleus stretch 30 seconds x 3, seated ankle circles clockwise and counter clockwise 30x each direction, ankle PF/DF on upside down gummie 30x each direction, ankle EV/IV on upside down gummie 30x each direction; yellow T-band resisted ankle DF, EV, IV 10x2 each direction.    Other Seated Knee Exercises Patient was recommended to not do too much walking during her trip to [place] for her grandson's graduation to not aggravate her tendon. Patient verbalized understanding. Reviewed HEP with pt.            PT Education - 03/09/15 2007    Education provided Yes   Education Details ther-ex, HEP   Person(s) Educated Patient   Methods Explanation;Demonstration;Tactile cues;Verbal cues;Handout   Comprehension Verbalized understanding;Returned demonstration           PT Long Term Goals - 02/22/15 2108    PT LONG TERM GOAL #1   Title Patient will be independent with her HEP to improve hip strength, femoral control, and decrease L foot discomfort.    Time 6   Status New   PT LONG TERM GOAL #2   Title Patient will improve  L LE strength by 1/2 MMT grade to promote femoral control and decrease L foot discomfort.    Time 6   Status New   PT LONG TERM GOAL #3   Title Patient will improve her LEFS score by at least 5 points as a demonstration of improved function.    Time 6   Period Weeks   Status New   PT LONG TERM GOAL #4   Title Patient will be able to negotiate stairs or pick up items from the floor (squat) with demonstration of good femoral control to decrease pressure on posterior tibialis tendon from foot pronation.    Time 6   Period Weeks   Status New   PT LONG TERM GOAL #5   Title Patient will improve L ankle DF AROM to at least 10 degrees to promote gastroc/soleus and postierior tibialis tendon  flexibility and decrease stress on the posterior tibialis tendon from talus and navicular bones.   Time 6   Period Weeks   Status New          Plan - 03/09/15 2008    Clinical Impression Statement Patient tolerated session well without aggravation of symptoms. Felt posterior tibialis tendon discomfort when trying to perform a standing soleus stretch but no pain when switched to sitting soleus stretch. No pain with resisted T-band ankle DF, EV, IV exercises.    Pt will benefit from skilled therapeutic intervention in order to improve on the following deficits Decreased range of motion;Difficulty walking;Pain;Decreased balance;Improper body mechanics;Decreased strength   Rehab Potential Good   PT Frequency 2x / week   PT Duration 6 weeks   PT Treatment/Interventions Ultrasound;Neuromuscular re-education;Gait training;Cryotherapy;Patient/family education;Electrical Stimulation;Taping;Therapeutic activities;Iontophoresis /ml Dexamethasone;Moist Heat;Therapeutic exercise;Manual techniques;Passive range of motion;ADLs/Self Care Home Management;Balance training   PT Next Visit Plan hip strengthening, femoral control   Consulted and Agree with Plan of Care Patient        Problem List Patient Active Problem List   Diagnosis Date Noted  . Secondary cardiomyopathy 12/14/2010  . Hyperlipidemia 05/03/2010  . HYPERTENSION, BENIGN 05/03/2010  . CARDIOMEGALY 05/03/2010    Robin Ortiz 03/09/2015, 8:15 PM  [place] [facility] [address] [place], [address] Phone: [phone]   Fax:  [phone]

## 2015-03-09 NOTE — Patient Instructions (Addendum)
Calf Stretch   Hands on wall, shoulder height, slightly wider than shoulders, fingers up. Right foot ahead of left. Body straight (like a board); lean into wall by bending front knee. Keep left heel on floor and foot straight ahead. Hold _30__ seconds. Push away with arms. Repeat _3__ times. Do on other leg.  Copyright  VHI. All rights reserved.     Seated soleus stretch 30 seconds x 3   Sitting on chair, heels on floor, slide left foot back until you feel a comfortable stretch. Hold for 30 seconds. Perform 3 times.     Patient was recommended to not walk too much during her trip for her grandson's graduation to not aggravate her tendon. Patient verbalized understanding.   Improved exercise technique, movement at target joints, use of target muscles after mod verbal, visual, tactile cues.

## 2015-03-14 ENCOUNTER — Ambulatory Visit: Payer: Medicare Other

## 2015-03-14 DIAGNOSIS — M76822 Posterior tibial tendinitis, left leg: Secondary | ICD-10-CM

## 2015-03-14 DIAGNOSIS — R531 Weakness: Secondary | ICD-10-CM

## 2015-03-14 DIAGNOSIS — M79672 Pain in left foot: Secondary | ICD-10-CM | POA: Diagnosis not present

## 2015-03-14 NOTE — Therapy (Signed)
[place] [facility] [address], [place], [address] Phone: [phone]   Fax:  [phone]  Physical Therapy Treatment  Patient Details  Name: Robin Ortiz MRN: [id number] Date of Birth: 08-18-1935 Referring Provider:  Vanita Ingles, MD  Encounter Date: 03/14/2015      PT End of Session - 03/14/15 0949    Visit Number 7   Number of Visits 13   Date for PT Re-Evaluation 04/05/15   Authorization Type 7   Authorization Time Period of 10   PT Start Time 0949   PT Stop Time 1036   PT Time Calculation (min) 47 min   Activity Tolerance Patient tolerated treatment well   Behavior During Therapy [facility] for tasks assessed/performed      Past Medical History  Diagnosis Date  . Cardiomegaly   . Other and unspecified hyperlipidemia   . Unspecified essential hypertension   . Secondary cardiomyopathy, unspecified   . Osteoporosis   . CHF (congestive heart failure)     Past Surgical History  Procedure Laterality Date  . Total knee arthroplasty Bilateral   . Rotator cuff repair      left shoulder  . Ruptured tendon right foot Right     surgical repair    There were no vitals filed for this visit.  Visit Diagnosis:  Tibial tendonitis, posterior, left  Weakness      Subjective Assessment - 03/14/15 1011    Subjective Patient states that her ankle puffed up this past weekend during her grandson's graduation. No pain. Thinks the heat might have played a factor. No pain currently.    Patient Stated Goals Patient states that she wants to prevent surgery for her foot. Build up her energy level   Currently in Pain? No/denies          [facility] Adult PT Treatment/Exercise - 03/14/15 1012    Knee/Hip Exercises: Seated   Other Seated Knee Exercises Directed patient with seated soleus stretch 30 seconds x3, seated toe crunches/curls squeezing towel 10x3 with 5 second holds, seated ankle DF, PF, EV, IV  resisting  yellow band 10x2 each direction,  standing T-band cowboy walk red 32 ft x6, T-band side step 32 ft x2 each direction resisting red band, standing mini squats 10x3 resisting red band for femoral control.    Other Seated Knee Exercises Pt education on femoral control and posterior tibialis tendon.            PT Education - 03/14/15 1941    Education provided Yes   Education Details Ther-ex. HEP   Person(s) Educated Patient   Methods Explanation;Demonstration;Tactile cues;Verbal cues;Handout   Comprehension Verbalized understanding;Returned demonstration             PT Long Term Goals - 02/22/15 2108    PT LONG TERM GOAL #1   Title Patient will be independent with her HEP to improve hip strength, femoral control, and decrease L foot discomfort.    Time 6   Status New   PT LONG TERM GOAL #2   Title Patient will improve L LE strength by 1/2 MMT grade to promote femoral control and decrease L foot discomfort.    Time 6   Status New   PT LONG TERM GOAL #3   Title Patient will improve her LEFS score by at least 5 points as a demonstration of improved function.    Time 6   Period Weeks   Status New   PT LONG  TERM GOAL #4   Title Patient will be able to negotiate stairs or pick up items from the floor (squat) with demonstration of good femoral control to decrease pressure on posterior tibialis tendon from foot pronation.    Time 6   Period Weeks   Status New   PT LONG TERM GOAL #5   Title Patient will improve L ankle DF AROM to at least 10 degrees to promote gastroc/soleus and postierior tibialis tendon flexibility and decrease stress on the posterior tibialis tendon from talus and navicular bones.   Time 6   Period Weeks   Status New               Plan - 03/14/15 1034    Clinical Impression Statement Imprpoved femoral control and decreasd L foot pronation after cues during closed chain T-band hip exercises.   Pt will benefit from skilled therapeutic intervention in  order to improve on the following deficits Decreased range of motion;Difficulty walking;Pain;Decreased balance;Improper body mechanics;Decreased strength   Rehab Potential Good   Clinical Impairments Affecting Rehab Potential no known impairments affecting rehab potential   PT Frequency 2x / week   PT Duration 6 weeks   PT Treatment/Interventions Ultrasound;Neuromuscular re-education;Gait training;Cryotherapy;Patient/family education;Electrical Stimulation;Taping;Therapeutic activities;Iontophoresis 4mg /ml Dexamethasone;Moist Heat;Therapeutic exercise;Manual techniques;Passive range of motion;ADLs/Self Care Home Management;Balance training   PT Next Visit Plan hip strengthening, femoral control   Consulted and Agree with Plan of Care Patient        Problem List Patient Active Problem List   Diagnosis Date Noted  . Secondary cardiomyopathy 12/14/2010  . Hyperlipidemia 05/03/2010  . HYPERTENSION, BENIGN 05/03/2010  . CARDIOMEGALY 05/03/2010    Davonna Ertl 03/14/2015, 7:48 PM  [place] [facility] [address], [place], [address] Phone: [phone]   Fax:  [phone]

## 2015-03-14 NOTE — Patient Instructions (Addendum)
Band Walk: Side Stepping   Tie band around legs, just above knees. Step __30_ feet to one side, then step back to start. Repeat __2_ times per session. Note: Small towel between band and skin eases rubbing.  Keep your knees from turning in.  http://plyo.exer.us/76   Copyright  VHI. All rights reserved.    Improved exercise technique, movement at target joints, use of target muscles after mod verbal, visual, tactile cues.

## 2015-03-15 ENCOUNTER — Other Ambulatory Visit: Payer: Self-pay

## 2015-03-15 MED ORDER — AMLODIPINE BESYLATE 5 MG PO TABS: ORAL_TABLET | ORAL | Status: DC

## 2015-03-15 NOTE — Telephone Encounter (Signed)
Refill sent for amlodipine.  

## 2015-03-16 ENCOUNTER — Ambulatory Visit: Payer: Medicare Other

## 2015-03-16 DIAGNOSIS — R531 Weakness: Secondary | ICD-10-CM

## 2015-03-16 DIAGNOSIS — M79672 Pain in left foot: Secondary | ICD-10-CM | POA: Diagnosis not present

## 2015-03-16 DIAGNOSIS — M76822 Posterior tibial tendinitis, left leg: Secondary | ICD-10-CM

## 2015-03-16 NOTE — Patient Instructions (Signed)
Improved exercise technique, movement at target joints, use of target muscles after mod verbal, visual, tactile cues.   

## 2015-03-16 NOTE — Therapy (Signed)
[place] [facility] [address] [place], [address] Phone: [phone]   Fax:  [phone]  Physical Therapy Treatment  Patient Details  Name: Robin Ortiz MRN: [id number] Date of Birth: [birth year] Referring Provider:  Vanita Ingles, MD  Encounter Date: 03/16/2015      PT End of Session - 03/16/15 0947    Visit Number 8   Number of Visits 13   Date for PT Re-Evaluation 04/05/15   Authorization Type 8   Authorization Time Period of 10   PT Start Time 0948   PT Stop Time 1031   PT Time Calculation (min) 43 min   Activity Tolerance Patient tolerated treatment well   Behavior During Therapy [facility] for tasks assessed/performed      Past Medical History  Diagnosis Date  . Cardiomegaly   . Other and unspecified hyperlipidemia   . Unspecified essential hypertension   . Secondary cardiomyopathy, unspecified   . Osteoporosis   . CHF (congestive heart failure)     Past Surgical History  Procedure Laterality Date  . Total knee arthroplasty Bilateral   . Rotator cuff repair      left shoulder  . Ruptured tendon right foot Right     surgical repair    There were no vitals filed for this visit.  Visit Diagnosis:  Tibial tendonitis, posterior, left  Weakness      Subjective Assessment - 03/16/15 0949    Subjective Patient states stopping using her cream for her L foot secondary to some allergic reaction and not really seeing any benefit. L foot swelled Monday. Was fine yesterday: had water aerobics, sat down and played with her grand children, cleaned her kitchen, worked on the walkway.    Patient Stated Goals Patient states that she wants to prevent surgery for her foot. Build up her energy level   Currently in Pain? No/denies   Multiple Pain Sites No                         [facility] Adult PT Treatment/Exercise - 03/16/15 1013    Knee/Hip Exercises: Seated   Other Seated Knee Exercises  Directed patient with seated ankle circles clockwise and counter clockwise 30 x each direction, ankle PF/DF  and EV/IV on upside down gummie 30x each direction, soleus stretch 30 seconds x3, seated toe crunches/curls squeezing towel 10x3 with 5 second holds, seated ankle DF, EV, IV  resisting yellow band 10x2 each direction,  standing T-band cowboy walk red 32 ft x6, T-band side step 32 ft x3 each direction resisting red band, standing mini squats 10x3 resisting red band for femoral control, standing L gastroc stretch 30 seconds x 3                PT Education - 03/16/15 1710    Education provided Yes   Education Details ther-ex   [employer]) Educated Patient   Methods Explanation;Demonstration;Tactile cues;Verbal cues   Comprehension Returned demonstration             PT Long Term Goals - 02/22/15 2108    PT LONG TERM GOAL #1   Title Patient will be independent with her HEP to improve hip strength, femoral control, and decrease L foot discomfort.    Time 6   Status New   PT LONG TERM GOAL #2   Title Patient will improve L LE strength by 1/2 MMT grade to promote femoral control and decrease L  foot discomfort.    Time 6   Status New   PT LONG TERM GOAL #3   Title Patient will improve her LEFS score by at least 5 points as a demonstration of improved function.    Time 6   Period Weeks   Status New   PT LONG TERM GOAL #4   Title Patient will be able to negotiate stairs or pick up items from the floor (squat) with demonstration of good femoral control to decrease pressure on posterior tibialis tendon from foot pronation.    Time 6   Period Weeks   Status New   PT LONG TERM GOAL #5   Title Patient will improve L ankle DF AROM to at least 10 degrees to promote gastroc/soleus and postierior tibialis tendon flexibility and decrease stress on the posterior tibialis tendon from talus and navicular bones.   Time 6   Period Weeks   Status New               Plan -  03/16/15 1024    Clinical Impression Statement Slight L plantar foot throb with resisted ankle PF yellow band 3x. Decreased with rest and PF/IV position. Otherwise no pain with exercises. Good foot arch positioning with T-band exercises.    Pt will benefit from skilled therapeutic intervention in order to improve on the following deficits Decreased range of motion;Difficulty walking;Pain;Decreased balance;Improper body mechanics;Decreased strength   Rehab Potential Good   Clinical Impairments Affecting Rehab Potential no known impairments affecting rehab potential   PT Frequency 2x / week   PT Duration 6 weeks   PT Treatment/Interventions Ultrasound;Neuromuscular re-education;Gait training;Cryotherapy;Patient/family education;Electrical Stimulation;Taping;Therapeutic activities;Iontophoresis /ml Dexamethasone;Moist Heat;Therapeutic exercise;Manual techniques;Passive range of motion;ADLs/Self Care Home Management;Balance training   PT Next Visit Plan hip strengthening, femoral control   Consulted and Agree with Plan of Care Patient        Problem List Patient Active Problem List   Diagnosis Date Noted  . Secondary cardiomyopathy 12/14/2010  . Hyperlipidemia 05/03/2010  . HYPERTENSION, BENIGN 05/03/2010  . CARDIOMEGALY 05/03/2010    Vasil Juhasz 03/16/2015, 5:13 PM  [place] [facility] [address], [place], [address] Phone: [phone]   Fax:  [phone]

## 2015-03-21 ENCOUNTER — Ambulatory Visit: Payer: Medicare Other

## 2015-03-21 DIAGNOSIS — M76822 Posterior tibial tendinitis, left leg: Secondary | ICD-10-CM

## 2015-03-21 DIAGNOSIS — R531 Weakness: Secondary | ICD-10-CM

## 2015-03-21 DIAGNOSIS — M79672 Pain in left foot: Secondary | ICD-10-CM | POA: Diagnosis not present

## 2015-03-21 NOTE — Patient Instructions (Signed)
Please see treatment note.  Improved exercise technique, movement at target joints, use of target muscles after mod verbal, visual, tactile cues.

## 2015-03-21 NOTE — Therapy (Signed)
[facility] [address] [place], [address] Phone: [phone]   Fax:  [phone]  Physical Therapy Treatment  Patient Details  Name: Robin Ortiz MRN: [id number] Date of Birth: [birth year] Referring Provider:  Vanita Ingles, MD  Encounter Date: 03/21/2015      PT End of Session - 03/21/15 0951    Visit Number 9   Number of Visits 13   Date for PT Re-Evaluation 04/05/15   Authorization Type 9   Authorization Time Period of 10   PT Start Time 0951   PT Stop Time 1043   PT Time Calculation (min) 52 min   Activity Tolerance Patient tolerated treatment well   Behavior During Therapy [facility] for tasks assessed/performed      Past Medical History  Diagnosis Date  . Cardiomegaly   . Other and unspecified hyperlipidemia   . Unspecified essential hypertension   . Secondary cardiomyopathy, unspecified   . Osteoporosis   . CHF (congestive heart failure)     Past Surgical History  Procedure Laterality Date  . Total knee arthroplasty Bilateral   . Rotator cuff repair      left shoulder  . Ruptured tendon right foot Right     surgical repair    There were no vitals filed for this visit.  Visit Diagnosis:  Tibial tendonitis, posterior, left  Weakness      Subjective Assessment - 03/21/15 1009    Subjective Patient states that the swelling in her L ankle is less. Going [place] the [place] this [place].  A little concerned about the terrain for her L foot. Also has an ankle brace that she can bring next visit. Has a follow-up visit with her MD this Friday.    Patient Stated Goals Patient states that she wants to prevent surgery for her foot. Build up her energy level   Currently in Pain? No/denies   Multiple Pain Sites No                         [facility] Adult PT Treatment/Exercise - 03/21/15 1029    Knee/Hip Exercises: Seated   Other Seated Knee Exercises Directed patient with seated  ankle circles clockwise and counter clockwise 30 x each direction, ankle PF/DF  and EV/IV on upside down gummie 30x each direction, seated soleus stretch 30 seconds x3, seated toe crunches/curls squeezing towel 10x3 with 5 second holds, seated ankle DF, EV, IV  resisting yellow band 10x3 each direction,  standing T-band cowboy walk red 32 ft x6, T-band side step 32 ft x2 each direction resisting red band, standing L gastroc stretch 30 seconds x 3. Instructed pt to try using a hot pack at her L calf muscle with her L LE propped (such as on recliner or bed to not increase swelling; so that L leg is not in a dependent position) with gastroc/soleus stretch using bed sheet.  Patient verbalized understanding.                 PT Education - 03/21/15 1220    Education provided Yes   Education Details Ther-ex, HEP   Person(s) Educated Patient   Methods Explanation;Demonstration;Tactile cues;Verbal cues   Comprehension Verbalized understanding;Returned demonstration             PT Long Term Goals - 02/22/15 2108    PT LONG TERM GOAL #1   Title Patient will be independent with her HEP to improve hip  strength, femoral control, and decrease L foot discomfort.    Time 6   Status New   PT LONG TERM GOAL #2   Title Patient will improve L LE strength by 1/2 MMT grade to promote femoral control and decrease L foot discomfort.    Time 6   Status New   PT LONG TERM GOAL #3   Title Patient will improve her LEFS score by at least 5 points as a demonstration of improved function.    Time 6   Period Weeks   Status New   PT LONG TERM GOAL #4   Title Patient will be able to negotiate stairs or pick up items from the floor (squat) with demonstration of good femoral control to decrease pressure on posterior tibialis tendon from foot pronation.    Time 6   Period Weeks   Status New   PT LONG TERM GOAL #5   Title Patient will improve L ankle DF AROM to at least 10 degrees to promote gastroc/soleus  and postierior tibialis tendon flexibility and decrease stress on the posterior tibialis tendon from talus and navicular bones.   Time 6   Period Weeks   Status New               Plan - 03/21/15 1048    Clinical Impression Statement Tolerated session without aggravation of pain. Slight increase in L ankle swelling. Patient states that she will put ice around her ankle when she gets home.    Pt will benefit from skilled therapeutic intervention in order to improve on the following deficits Decreased range of motion;Difficulty walking;Pain;Decreased balance;Improper body mechanics;Decreased strength   Rehab Potential Good   Clinical Impairments Affecting Rehab Potential no known impairments affecting rehab potential   PT Frequency 2x / week   PT Duration 6 weeks   PT Treatment/Interventions Ultrasound;Neuromuscular re-education;Gait training;Cryotherapy;Patient/family education;Electrical Stimulation;Taping;Therapeutic activities;Iontophoresis 4mg /ml Dexamethasone;Moist Heat;Therapeutic exercise;Manual techniques;Passive range of motion;ADLs/Self Care Home Management;Balance training   PT Next Visit Plan hip strengthening, femoral control   Consulted and Agree with Plan of Care Patient        Problem List Patient Active Problem List   Diagnosis Date Noted  . Secondary cardiomyopathy 12/14/2010  . Hyperlipidemia 05/03/2010  . HYPERTENSION, BENIGN 05/03/2010  . CARDIOMEGALY 05/03/2010    Jehad Bisono 03/21/2015, 12:35 PM  [place] [facility] [address], [place], [address] Phone: [phone]   Fax:  [phone]

## 2015-03-23 ENCOUNTER — Ambulatory Visit: Payer: Medicare Other

## 2015-03-23 DIAGNOSIS — M79672 Pain in left foot: Secondary | ICD-10-CM | POA: Diagnosis not present

## 2015-03-23 DIAGNOSIS — M76822 Posterior tibial tendinitis, left leg: Secondary | ICD-10-CM

## 2015-03-23 DIAGNOSIS — R531 Weakness: Secondary | ICD-10-CM

## 2015-03-23 NOTE — Therapy (Signed)
[facility] [address], [place], [address] Phone: [phone]   Fax:  [phone]  Physical Therapy Treatment  Patient Details  Name: Robin Ortiz MRN: [id number] Date of Birth: [birth year] Referring Provider:  Vanita Ingles, MD  Encounter Date: 03/23/2015      PT End of Session - 03/23/15 1020    Visit Number 10   Number of Visits 13   Date for PT Re-Evaluation 04/05/15   Authorization Type 1   Authorization Time Period of 10   PT Start Time 0952   PT Stop Time 1043   PT Time Calculation (min) 51 min   Activity Tolerance Patient tolerated treatment well   Behavior During Therapy [facility] for tasks assessed/performed      Past Medical History  Diagnosis Date  . Cardiomegaly   . Other and unspecified hyperlipidemia   . Unspecified essential hypertension   . Secondary cardiomyopathy, unspecified   . Osteoporosis   . CHF (congestive heart failure)     Past Surgical History  Procedure Laterality Date  . Total knee arthroplasty Bilateral   . Rotator cuff repair      left shoulder  . Ruptured tendon right foot Right     surgical repair    There were no vitals filed for this visit.  Visit Diagnosis:  Tibial tendonitis, posterior, left  Weakness      Subjective Assessment - 03/23/15 1020    Subjective Patient states that her ankle does not swell as much as before. Swelled yesterday but did not do much activities however. Forgot to bring her ankle brace.    Patient Stated Goals Patient states that she wants to prevent surgery for her foot. Build up her energy level   Currently in Pain? No/denies   Multiple Pain Sites No           [facility] Adult PT Treatment/Exercise - 03/23/15 1052    Exercises   Other Exercises  Directed patient with seated with L foot on upside down gummie: ankle DF/PF 30x each way, EV/IV 30x each way, ankle circles clockwise and counterclockwise 30x each direction;  T-band ankle DF, EV, IV, PF 10x3 each direction, T-band cowboy walk red band 64 ft x 3, T-band side step 32 ft x 2 each side. Pt education on flat feet and effect on tibialis posterior tendon. Pt was recommended to try over the counter arch supports after session and until next session and see how it does. Pt was instructed to ask store about their return policy and to keep the receipt if it does not work if pt able to return product. Pt verbalized understanding and that she plans to get the arch supports today.            PT Education - 03/23/15 1100    Education provided Yes   Education Details ther-ex, foot pronation, arch support, effect on tibialis posterior, HEP   Person(s) Educated Patient   Methods Explanation;Demonstration;Verbal cues   Comprehension Verbalized understanding;Returned demonstration             PT Long Term Goals - 03/23/15 1853    PT LONG TERM GOAL #1   Title Patient will be independent with her HEP to improve hip strength, femoral control, and decrease L foot discomfort.    Time 6   Status On-going   PT LONG TERM GOAL #2   Title Patient will improve L LE strength by 1/2 MMT grade to promote femoral  control and decrease L foot discomfort.    Time 6   Status On-going   PT LONG TERM GOAL #3   Title Patient will improve her LEFS score by at least 5 points as a demonstration of improved function.    Time 6   Period Weeks   Status On-going   PT LONG TERM GOAL #4   Title Patient will be able to negotiate stairs or pick up items from the floor (squat) with demonstration of good femoral control to decrease pressure on posterior tibialis tendon from foot pronation.    Time 6   Period Weeks   Status On-going   PT LONG TERM GOAL #5   Title Patient will improve L ankle DF AROM to at least 10 degrees to promote gastroc/soleus and postierior tibialis tendon flexibility and decrease stress on the posterior tibialis tendon from talus and navicular bones.   Time 6    Period Weeks   Status On-going           Plan - 2015/04/18 1043    Clinical Impression Statement Tolerated session well without L ankle pain or increased swelling. Improved femoral control and decreased pes planus with T-band cowboy walk and side stepping after cues for proper foot placement and femoral control. Still demonstrates L ankle swelling but according to patient, the swelling is not as much as before. Patient will benefit from continued skilled physical therapy services to improve femoral control to decrease pes planus and pressure to tibialis posterior tendon, improve gastroc/soleus flexibility, and improve bilateral hip strength and function.    Pt will benefit from skilled therapeutic intervention in order to improve on the following deficits Decreased range of motion;Difficulty walking;Pain;Decreased balance;Improper body mechanics;Decreased strength   Rehab Potential Good   PT Frequency 2x / week   PT Duration 6 weeks   PT Treatment/Interventions Ultrasound;Neuromuscular re-education;Gait training;Cryotherapy;Patient/family education;Electrical Stimulation;Taping;Therapeutic activities;Iontophoresis /ml Dexamethasone;Moist Heat;Therapeutic exercise;Manual techniques;Passive range of motion;ADLs/Self Care Home Management;Balance training   PT Next Visit Plan hip strengthening, femoral control   Consulted and Agree with Plan of Care Patient          G-Codes - April 18, 2015 1843    Functional Assessment Tool Used clinical presentation, patient interview   Functional Limitation Mobility: Walking and moving around   Mobility: Walking and Moving Around Current Status [phone]) At least 20 percent but less than 40 percent impaired, limited or restricted   Mobility: Walking and Moving Around Goal Status [phone]) At least 1 percent but less than 20 percent impaired, limited or restricted      Problem List Patient Active Problem List   Diagnosis Date Noted  . Secondary cardiomyopathy  12/14/2010  . Hyperlipidemia 05/03/2010  . HYPERTENSION, BENIGN 05/03/2010  . CARDIOMEGALY 05/03/2010    Attilio Zeitler 2015/04/18, 6:56 PM  [place] [address], [place], [address] Phone: [phone]   Fax:  [phone]

## 2015-03-23 NOTE — Patient Instructions (Addendum)
Please see treatment flow sheet.   Improved exercise technique, movement at target joints, use of target muscles after mod verbal, visual, tactile cues.    Gave T-band ankle DF/PF/IV/EV as part of her HEP using yellow band. Patient demonstrated and verbalized understanding.   Dorsiflexion: Resisted   Facing anchor, tubing around left foot, pull toward face.  Repeat __10__ times per set. Do _3___ sets per session. Do _1___ sessions per day.  http://orth.exer.us/9   Copyright  VHI. All rights reserved.    Eversion: Resisted   With left foot in tubing loop, hold tubing around other foot to resist and turn foot out. Repeat __10__ times per set. Do _3___ sets per session. Do ___1_ sessions per day.  http://orth.exer.us/15   Copyright  VHI. All rights reserved.    Inversion: Resisted   Cross legs with leftt leg underneath, foot in tubing loop. Hold tubing around other foot to resist and turn foot in. Repeat __10__ times per set. Do __3__ sets per session. Do _1___ sessions per day.  http://orth.exer.us/13   Copyright  VHI. All rights reserved.    Plantar Flexion: Resisted   Anchor behind, tubing around left foot, press down. Repeat ___10_ times per set. Do ___3_ sets per session. Do _1___ sessions per day.  http://orth.exer.us/11   Copyright  VHI. All rights reserved.   Aforementioned pictures of exercises to be given at next visit secondary to unable to provide pictures of ankle exercises at the time of session. Pt however verbalized understanding of the ankle exercises.

## 2015-03-24 ENCOUNTER — Other Ambulatory Visit: Payer: Self-pay | Admitting: Internal Medicine

## 2015-03-24 DIAGNOSIS — Z1231 Encounter for screening mammogram for malignant neoplasm of breast: Secondary | ICD-10-CM

## 2015-03-28 ENCOUNTER — Ambulatory Visit: Payer: Medicare Other

## 2015-03-28 ENCOUNTER — Other Ambulatory Visit: Payer: Self-pay | Admitting: Internal Medicine

## 2015-03-28 ENCOUNTER — Other Ambulatory Visit: Payer: Self-pay

## 2015-03-28 ENCOUNTER — Ambulatory Visit
Admission: RE | Admit: 2015-03-28 | Discharge: 2015-03-28 | Disposition: A | Discharge status: Home / Self Care | Payer: Medicare Other | Source: Ambulatory Visit | Attending: Internal Medicine | Admitting: Internal Medicine

## 2015-03-28 DIAGNOSIS — R531 Weakness: Secondary | ICD-10-CM

## 2015-03-28 DIAGNOSIS — M76822 Posterior tibial tendinitis, left leg: Secondary | ICD-10-CM

## 2015-03-28 DIAGNOSIS — M79672 Pain in left foot: Secondary | ICD-10-CM | POA: Diagnosis not present

## 2015-03-28 DIAGNOSIS — Z1231 Encounter for screening mammogram for malignant neoplasm of breast: Secondary | ICD-10-CM | POA: Diagnosis not present

## 2015-03-28 NOTE — Therapy (Signed)
[facility] [address], [place], [address] Phone: [phone]   Fax:  [phone]  Physical Therapy Treatment  Patient Details  Name: Robin Ortiz MRN: [id number] Date of Birth: 07-Feb-1935 Referring Provider:  Gardiner Fanti, MD  Encounter Date: 03/28/2015      PT End of Session - 03/28/15 0951    Visit Number 11   Number of Visits 13   Date for PT Re-Evaluation 04/05/15   Authorization Type 2   Authorization Time Period of 10   PT Start Time 0951   PT Stop Time 1050   PT Time Calculation (min) 59 min   Activity Tolerance Patient tolerated treatment well   Behavior During Therapy [facility] for tasks assessed/performed      Past Medical History  Diagnosis Date  . Cardiomegaly   . Other and unspecified hyperlipidemia   . Unspecified essential hypertension   . Secondary cardiomyopathy, unspecified   . Osteoporosis   . CHF (congestive heart failure)     Past Surgical History  Procedure Laterality Date  . Total knee arthroplasty Bilateral   . Rotator cuff repair      left shoulder  . Ruptured tendon right foot Right     surgical repair    There were no vitals filed for this visit.  Visit Diagnosis:  Tibial tendonitis, posterior, left  Weakness  Left foot pain      Subjective Assessment - 03/28/15 0954    Subjective Pt states that the puffiness comes and goes. Less than before. Went to her doctor's appointment last Friday and was discharged but to call them back if she has any more problems. L tendon bothered her when she walked up the hill but not down the hill.  Also got an ankle brace this weekend  which helped with the swelling.  Going to the mountains all of [place]. She will be back sometime in  August  However going back and forth to the mountains during the fall.  Went to a funeral Friday and her L ankle swelled up. Pt however states that it was hot that day though.    Patient Stated  Goals Patient states that she wants to prevent surgery for her foot. Build up her energy level   Currently in Pain? No/denies   Multiple Pain Sites No            [facility] PT Assessment - 03/28/15 1040    Observation/Other Assessments   Lower Extremity Functional Scale  59/80 (26% self perceived disability)   AROM   Overall AROM Comments L ankle DF 10 degrees (gastroc)    Strength   Overall Strength Comments L ankle DF, 4+/5, EV 4+/5, IV 4/5 (with slight discomfort), PF 5/5, PF/IV 4+/5, hip abduction 4/5 L, R 4/5          [facility] Adult PT Treatment/Exercise - 03/28/15 1024    Exercises   Other Exercises  Directed patient with forward walking on treadmill speed 0.9 with incline level 3 for 5 min,   standing gastroc stretch 30 seconds x 3 each LE; L ankle DF, PF, EV, IV 10x3 each direction with yellow band (with review of exercise for HEP), manually resisted L ankle DF, PF, EV, IV, and PF with IV, hip abduction (both LE) 1-2x each way. Recommended for pt to step up with her R LE when going uphill when at the mountains if her L LE bothers her again when walking up an incline.  Pt verbalized understanding. Reviewed progress/current status with hip abduction strength and L ankle DF AROM with pt. Reviewed plan of care with pt.                 PT Education - 03/28/15 1925    Education provided Yes   Education Details ther-ex, HEP, plan of care   Person(s) Educated Patient   Methods Explanation;Demonstration;Tactile cues;Verbal cues;Handout   Comprehension Verbalized understanding;Returned demonstration             PT Long Term Goals - 03/28/15 1933    PT LONG TERM GOAL #1   Title Patient will be independent with her HEP to improve hip strength, femoral control, and decrease L foot discomfort.    Time 6   Status On-going   PT LONG TERM GOAL #2   Title Patient will improve L LE strength by 1/2 MMT grade to promote femoral control and decrease L foot discomfort.    Time 6    Status Partially Met   PT LONG TERM GOAL #3   Title Patient will improve her LEFS score by at least 5 points as a demonstration of improved function.    Time 6   Period Weeks   Status Achieved   PT LONG TERM GOAL #4   Title Patient will be able to negotiate stairs or pick up items from the floor (squat) with demonstration of good femoral control to decrease pressure on posterior tibialis tendon from foot pronation.    Time 6   Period Weeks   Status On-going   PT LONG TERM GOAL #5   Title Patient will improve L ankle DF AROM to at least 10 degrees to promote gastroc/soleus and postierior tibialis tendon flexibility and decrease stress on the posterior tibialis tendon from talus and navicular bones.   Time 6   Period Weeks   Status Achieved               Plan - 03/28/15 1532    Clinical Impression Statement Pt demonstrates improved L ankle DF AROM, and bilateral hip strength and improved LEFS score by 6 points suggesting improved function since initial evaluation. Still demonstrates difficulty with femoral control but improving. Currently uses an over the counter arch support to help decrease stress to her posterior tibial tendon. Pt also to wear an ankle brace when up in the mountains to add support to her ankle to help control pronation when negotiaing uneven terrain. Continue skilled PT services to promote femoral control, gastroc/soleus flexibility, decrease tension on posterior tibial tendon. Pt  to follow up after returning from her trip to check the status of her tendon.    Pt will benefit from skilled therapeutic intervention in order to improve on the following deficits Decreased range of motion;Difficulty walking;Pain;Decreased balance;Improper body mechanics;Decreased strength   Rehab Potential Good   PT Frequency 2x / week   PT Duration 6 weeks   PT Treatment/Interventions Ultrasound;Neuromuscular re-education;Gait training;Cryotherapy;Patient/family education;Electrical  Stimulation;Taping;Therapeutic activities;Iontophoresis 23m/ml Dexamethasone;Moist Heat;Therapeutic exercise;Manual techniques;Passive range of motion;ADLs/Self Care Home Management;Balance training   PT Next Visit Plan hip strengthening, femoral control, ankle strengthening.    Consulted and Agree with Plan of Care Patient        Problem List Patient Active Problem List   Diagnosis Date Noted  . Secondary cardiomyopathy 12/14/2010  . Hyperlipidemia 05/03/2010  . HYPERTENSION, BENIGN 05/03/2010  . CARDIOMEGALY 05/03/2010    Maren Wiesen 03/28/2015, 7:41 PM  [facility] [address] [place], [place], [address] Phone: [phone]   Fax:  [phone]

## 2015-03-28 NOTE — Patient Instructions (Addendum)
Please see treatment flow sheet.   Improved exercise technique, movement at target joints, use of target muscles after mod verbal, visual, tactile cues.   Also provided hand outs of ankle exercises for her HEP.

## 2015-03-30 ENCOUNTER — Ambulatory Visit: Payer: Medicare Other

## 2015-03-30 DIAGNOSIS — R531 Weakness: Secondary | ICD-10-CM

## 2015-03-30 DIAGNOSIS — M79672 Pain in left foot: Secondary | ICD-10-CM

## 2015-03-30 DIAGNOSIS — M76822 Posterior tibial tendinitis, left leg: Secondary | ICD-10-CM

## 2015-03-30 NOTE — Patient Instructions (Addendum)
Pt was recommended to step up with her R LE (up with the good technique) when climbing an incline in the mountains if her L foot and ankle bother her. Pt was also instructed to continue performing her HEP. Pt verbalized understanding.   Improved exercise technique, movement at target joints, use of target muscles after min verbal, visual, tactile cues.

## 2015-03-30 NOTE — Therapy (Signed)
[facility] [address], [place], [address] Phone: [phone]   Fax:  [phone]  Physical Therapy Treatment and  Progress Report  Patient Details  Name: Robin Ortiz MRN: [id number] Date of Birth: 1934-11-10 Referring Provider:  Gardiner Fanti, MD  Encounter Date: 03/30/2015      PT End of Session - 03/30/15 1000    Visit Number 12   Number of Visits 13   Date for PT Re-Evaluation 04/05/15   Authorization Type 3   Authorization Time Period of 10   PT Start Time 0959   PT Stop Time 1052   PT Time Calculation (min) 53 min   Activity Tolerance Patient tolerated treatment well   Behavior During Therapy [facility] for tasks assessed/performed      Past Medical History  Diagnosis Date  . Cardiomegaly   . Other and unspecified hyperlipidemia   . Unspecified essential hypertension   . Secondary cardiomyopathy, unspecified   . Osteoporosis   . CHF (congestive heart failure)     Past Surgical History  Procedure Laterality Date  . Total knee arthroplasty Bilateral   . Rotator cuff repair      left shoulder  . Ruptured tendon right foot Right     surgical repair    There were no vitals filed for this visit.  Visit Diagnosis:  Tibial tendonitis, posterior, left - Plan: PT plan of care cert/re-cert  Weakness - Plan: PT plan of care cert/re-cert  Left foot pain - Plan: PT plan of care cert/re-cert      Subjective Assessment - 03/30/15 1001    Subjective Pt states buying new sandals which support her arch. States L ankle feeling puffy but no pain. Going to the [employer].    Patient Stated Goals Patient states that she wants to prevent surgery for her foot. Build up her energy level   Multiple Pain Sites No            [facility] PT Assessment - 03/30/15 1012    Strength   Overall Strength Comments Hip ER 4/5 bilaterally; glute max extension R 4/5 (decreased hamstring cramp with  activation of L quadriceps), L 4/5          [facility] Adult PT Treatment/Exercise - 03/30/15 1041    Exercises   Other Exercises  Directed patient with seated L soleus stretch 30 seconds x 6, treadmill speed 1.2 at incline level 3-4, for 5 min, manually resisted glute max extension 2-3 x each LE (with tactile cues for decrease hamstring use), reviewed progress with hip abduction and glute extension strength, T-band (red) side step 32 ft x 2 each direction, T-band cowbow walk red band 32 ft x 4, ankle DF/PF 2 min x 2, seated ankle DF/PF on upside down gummie 30x, EV/IV with foot on upside down gummie 30x          PT Education - 03/30/15 1124    Education provided Yes   Education Details ther-ex, plan of care   Person(s) Educated Patient   Methods Explanation;Demonstration;Tactile cues;Verbal cues   Comprehension Verbalized understanding;Returned demonstration           PT Long Term Goals - 03/30/15 1107    PT LONG TERM GOAL #1   Title Patient will be independent with her HEP to improve hip strength, femoral control, and decrease L foot discomfort.    Time 6   Status Achieved   PT LONG TERM GOAL #2  Title Patient will improve L LE strength by 1/2 MMT grade to promote femoral control and decrease L foot discomfort.    Time 12   Period Weeks   Status Partially Met   PT LONG TERM GOAL #3   Title Patient will improve her LEFS score by at least 5 points as a demonstration of improved function.    Time 6   Period Weeks   Status Achieved   PT LONG TERM GOAL #4   Title Patient will be able to negotiate stairs or pick up items from the floor (squat) with demonstration of good femoral control to decrease pressure on posterior tibialis tendon from foot pronation.    Time 12   Period Weeks   Status On-going   PT LONG TERM GOAL #5   Title Patient will improve L ankle DF AROM to at least 10 degrees to promote gastroc/soleus and postierior tibialis tendon flexibility and decrease stress on  the posterior tibialis tendon from talus and navicular bones.   Time 6   Period Weeks   Status Achieved          Plan - 03/30/15 1004    Clinical Impression Statement Pt still demonstrates puffiness L medial ankle but no TTP along Tibialis posterior tendon. Pt also demonstrates improved glute max and glute med strength, improved femoral control, and improved LEFS score suggesting improved function since initial evaluation. [place] and  demonstrates independence with her home exercise program which involves T-band ankle DF, PF, EV, IV,  T-band side step and cowboy walk exercises, gastroc and  seated soleus stretches. Pt to follow up after returning from her trip to see how her tibialis posterior tendon tolerates her trip. Upon inspection, the new sandals patient purchased demonstrates good support of her L plantar arch.  Pt will benefit from continued skilled physical therapy services to promote gastroc/soleus flexibility, ankle strength, femoral control and decreasing pressure on tibialis posterior tendon.    Pt will benefit from skilled therapeutic intervention in order to improve on the following deficits Difficulty walking;Pain;Improper body mechanics;Decreased strength   Rehab Potential Good   PT Frequency Other (comment)  1 follow up visit when pt returns sometime in August   PT Duration 12 weeks   PT Treatment/Interventions Ultrasound;Neuromuscular re-education;Gait training;Cryotherapy;Patient/family education;Electrical Stimulation;Taping;Therapeutic activities;Iontophoresis 57m/ml Dexamethasone;Moist Heat;Therapeutic exercise;Manual techniques;Passive range of motion;ADLs/Self Care Home Management;Balance training   PT Next Visit Plan Re-assessment   Consulted and Agree with Plan of Care Patient        Problem List Patient Active Problem List   Diagnosis Date Noted  . Secondary cardiomyopathy 12/14/2010  . Hyperlipidemia 05/03/2010  .  HYPERTENSION, BENIGN 05/03/2010  . CARDIOMEGALY 05/03/2010    Belma Dyches 03/30/2015, 11:35 AM  [facility] [address] [place] [id number]: [phone]  Fax:  [phone]

## 2015-05-03 ENCOUNTER — Other Ambulatory Visit: Payer: Self-pay

## 2015-05-03 MED ORDER — RAMIPRIL 10 MG PO CAPS: 10.0000 mg | ORAL_CAPSULE | Freq: Two times a day (BID) | ORAL | Status: DC

## 2015-05-03 NOTE — Telephone Encounter (Signed)
Refill sent for ramipril 10 mg take one tablet twice a day.

## 2015-06-28 ENCOUNTER — Other Ambulatory Visit: Payer: Self-pay

## 2015-06-28 MED ORDER — CARVEDILOL 25 MG PO TABS: 25.0000 mg | ORAL_TABLET | Freq: Two times a day (BID) | ORAL | Status: DC

## 2015-06-28 NOTE — Telephone Encounter (Signed)
Refill sent for carvedilol 25 mg  

## 2015-09-08 ENCOUNTER — Telehealth: Payer: Self-pay

## 2015-09-08 NOTE — Telephone Encounter (Signed)
Pt c/o Shortness Of Breath: STAT if SOB developed within the last 24 hours or pt is noticeably SOB on the phone  1. Are you currently SOB (can you hear that pt is SOB on the phone)? yes  2. How long have you been experiencing SOB? Within the last several weeks  3. Are you SOB when sitting or when up moving around? Just walking, and last night when she got in the bed, was able to catch her breath.   4. Are you currently experiencing any other symptoms? Dry coughing that comes and goes, states she has always had it  States she just got back from [place]. At the airport walking to baggage claim she was very SOB, and states she was not running.

## 2015-09-08 NOTE — Telephone Encounter (Signed)
Looks like this is a chronic problem for her and she hasn't been seen in almost a year.   Does Robin Ortiz still have an opening today?

## 2015-09-08 NOTE — Telephone Encounter (Signed)
Pt sched to see Dr. Mariah Milling tomorrow, 12/9, @ 2:00.

## 2015-09-09 ENCOUNTER — Other Ambulatory Visit: Payer: Self-pay

## 2015-09-09 ENCOUNTER — Ambulatory Visit (INDEPENDENT_AMBULATORY_CARE_PROVIDER_SITE_OTHER): Payer: Medicare Other | Admitting: Cardiovascular Disease

## 2015-09-09 ENCOUNTER — Encounter: Payer: Self-pay | Admitting: Cardiovascular Disease

## 2015-09-09 ENCOUNTER — Ambulatory Visit (INDEPENDENT_AMBULATORY_CARE_PROVIDER_SITE_OTHER): Payer: Medicare Other

## 2015-09-09 VITALS — BP 138/62 | HR 59 | Ht 65.5 in | Wt 158.0 lb

## 2015-09-09 DIAGNOSIS — I10 Essential (primary) hypertension: Secondary | ICD-10-CM

## 2015-09-09 DIAGNOSIS — E785 Hyperlipidemia, unspecified: Secondary | ICD-10-CM

## 2015-09-09 DIAGNOSIS — I429 Cardiomyopathy, unspecified: Secondary | ICD-10-CM

## 2015-09-09 DIAGNOSIS — R0602 Shortness of breath: Secondary | ICD-10-CM

## 2015-09-09 DIAGNOSIS — R053 Chronic cough: Secondary | ICD-10-CM | POA: Insufficient documentation

## 2015-09-09 DIAGNOSIS — R5383 Other fatigue: Secondary | ICD-10-CM

## 2015-09-09 DIAGNOSIS — R05 Cough: Secondary | ICD-10-CM | POA: Insufficient documentation

## 2015-09-09 MED ORDER — FUROSEMIDE 20 MG PO TABS: 20.0000 mg | ORAL_TABLET | Freq: Every day | ORAL | Status: DC | PRN

## 2015-09-09 MED ORDER — AMLODIPINE BESYLATE 5 MG PO TABS: ORAL_TABLET | ORAL | Status: DC

## 2015-09-09 MED ORDER — CARVEDILOL 25 MG PO TABS: 25.0000 mg | ORAL_TABLET | Freq: Two times a day (BID) | ORAL | Status: DC

## 2015-09-09 MED ORDER — LOSARTAN POTASSIUM 50 MG PO TABS: 50.0000 mg | ORAL_TABLET | Freq: Every day | ORAL | Status: DC

## 2015-09-09 NOTE — Assessment & Plan Note (Signed)
Increasing shortness of breath, fatigue on today's visit. I suspect she may be having acute on chronic diastolic CHF Recommended she continue to take Lasix as needed for shortness of breath symptoms She will take this with potassium Echocardiogram has been ordered to evaluate ejection fraction and will be possibly done today

## 2015-09-09 NOTE — Assessment & Plan Note (Signed)
Etiology of her fatigue is unclear, possibly from lots of travel, lots of entertaining, "burning the candle" Recommended she rest, will wait for results of echocardiogram Restart her exercise program which she has not been doing for several weeks

## 2015-09-09 NOTE — Patient Instructions (Addendum)
Please hold the ramipril (can cause cough) Start losartan one a day instead  Continue lasix as needed with potassium for shortness of breath  We will schedule you for an echocardiogram for cardiomyopathy, shortness of breath We will call you with the results  Please call us if you have new issues that need to be addressed before your next appt.    Echocardiogram An echocardiogram, or echocardiography, uses sound waves (ultrasound) to produce an image of your heart. The echocardiogram is simple, painless, obtained within a short period of time, and offers valuable information to your health care provider. The images from an echocardiogram can provide information such as:  Evidence of coronary artery disease (CAD).  Heart size.  Heart muscle function.  Heart valve function.  Aneurysm detection.  Evidence of a past heart attack.  Fluid buildup around the heart.  Heart muscle thickening.  Assess heart valve function. LET [facility] CARE PROVIDER KNOW ABOUT:  Any allergies you have.  All medicines you are taking, including vitamins, herbs, eye drops, creams, and over-the-counter medicines.  Previous problems you or members of your family have had with the use of anesthetics.  Any blood disorders you have.  Previous surgeries you have had.  Medical conditions you have.  Possibility of pregnancy, if this applies. BEFORE THE PROCEDURE  No special preparation is needed. Eat and drink normally.  PROCEDURE   In order to produce an image of your heart, gel will be applied to your chest and a wand-like tool (transducer) will be moved over your chest. The gel will help transmit the sound waves from the transducer. The sound waves will harmlessly bounce off your heart to allow the heart images to be captured in real-time motion. These images will then be recorded.  You may need an IV to receive a medicine that improves the quality of the pictures. AFTER THE PROCEDURE You may  return to your normal schedule including diet, activities, and medicines, unless your health care provider tells you otherwise.   This information is not intended to replace advice given to you by your health care provider. Make sure you discuss any questions you have with your health care provider.   Document Released: 09/14/2000 Document Revised: 10/08/2014 Document Reviewed: 05/25/2013 Elsevier Interactive Patient Education [employer]

## 2015-09-09 NOTE — Progress Notes (Signed)
Patient ID: Robin Ortiz, female    DOB: 05-14-1935, 80 y.o.   MRN: [id number]  HPI Comments: Robin Ortiz is a pleasant 80 year-old woman with a history of hypertension, hyperlipidemia, remote history of nonischemic cardiomyopathy with initial ejection fraction 20-30% in 2002 that has improved to 50-55% on echocardiogram in March 2009, status post bilateral knee replacement who presents for routine follow up  Of her cardiomyopathy.   in follow-up today, she reports that she has been traveling, eating poorly,weight gain She has noticed fatigue, shortness of breath, increasing cough over the past several weeks Recently got back from [place], notice significant shortness of breath while she was there and now at home Symptoms did start prior to her trip Chronic fatigue has been an issue, feels that she could sleep right now She has been active, posting parties, going out with friends, traveling Also reports having chronic cough even before all her symptoms started Family has noticed the cough, wonders if it could be from ramipril She's been taking Lasix yesterday, possibly improvement of her shortness of breath  Sugars have been trending higher, lab work reviewed with her, glucose 105 now 112 She attributes this to eating poorly Last echocardiogram 2009 Ejection fraction had improved from 25% up to 50% in 2009 Other lab work shows total cholesterol 146, LDL 67, TSH 1  EKG on today's visit shows normal sinus rhythm with rate 59 bpm, no significant ST or T-wave changes  Other past medical history She did a lifeline screening in the past that showed no significant carotid arterial disease, no AAA, no significant PAD. She is low risk for osteoporosis   Remote cardiac catheterization in 2002 showing mild RCA disease at the ostium.    No Known Allergies  Outpatient Encounter Prescriptions as of 09/09/2015  Medication Sig  . amLODipine (NORVASC) 5 MG tablet Take 1/2-1 tablet by mouth daily.   Marland Kitchen aspirin 81 MG tablet Take 81 mg by mouth daily.    . Calcium-Vitamin D 600-200 MG-UNIT per tablet Take 1 tablet by mouth daily.    . carvedilol (COREG) 25 MG tablet Take 1 tablet (25 mg total) by mouth 2 (two) times daily with a meal.  . diclofenac sodium (VOLTAREN) 1 % GEL Apply 2 g topically 4 (four) times daily.  . fexofenadine (ALLEGRA) 180 MG tablet Take 180 mg by mouth daily as needed. 1/2 to 1 tablet qd  . furosemide (LASIX) 20 MG tablet Take 1 tablet (20 mg total) by mouth daily as needed.  . Misc Natural Products (OSTEO BI-FLEX ADV JOINT SHIELD PO) Take 1 tablet by mouth daily.    . Multiple Vitamin (MULTIVITAMIN) tablet Take 1 tablet by mouth daily.    . Omega-3 Fatty Acids (FISH OIL) 1000 MG CAPS Take 1 capsule by mouth daily.    . potassium chloride (KLOR-CON) 20 MEQ packet Take 20 mEq by mouth daily as needed. With lasix   . raloxifene (EVISTA) 60 MG tablet Take 60 mg by mouth daily.    . simvastatin (ZOCOR) 20 MG tablet Take 20 mg by mouth at bedtime.    . [DISCONTINUED] amLODipine (NORVASC) 5 MG tablet Take 1/2-1 tablet by mouth daily.  . [DISCONTINUED] carvedilol (COREG) 25 MG tablet Take 1 tablet (25 mg total) by mouth 2 (two) times daily with a meal.  . [DISCONTINUED] furosemide (LASIX) 20 MG tablet Take 20 mg by mouth as needed.   . [DISCONTINUED] ramipril (ALTACE) 10 MG capsule Take 1 capsule (10 mg total) by  mouth 2 (two) times daily.  Marland Kitchen losartan (COZAAR) 50 MG tablet Take 1 tablet (50 mg total) by mouth daily.   No facility-administered encounter medications on file as of 09/09/2015.    Past Medical History  Diagnosis Date  . Cardiomegaly   . Other and unspecified hyperlipidemia   . Unspecified essential hypertension   . Secondary cardiomyopathy, unspecified   . Osteoporosis   . CHF (congestive heart failure) [facility])     Past Surgical History  Procedure Laterality Date  . Total knee arthroplasty Bilateral   . Rotator cuff repair      left shoulder  .  Ruptured tendon right foot Right     surgical repair    Social History  reports that she has never smoked. She does not have any smokeless tobacco history on file. She reports that she drinks about 1.2 - 1.8 oz of alcohol per week. She reports that she does not use illicit drugs.  Family History family history includes Breast cancer (age of onset: 65) in her maternal aunt; Breast cancer (age of onset: 56) in her maternal grandmother; Heart disease in her father.   Review of Systems  Constitutional: Positive for fatigue.  Respiratory: Positive for shortness of breath.   Cardiovascular: Negative.   Gastrointestinal: Negative.   Musculoskeletal: Positive for arthralgias.  Neurological: Negative.   Psychiatric/Behavioral: Negative.   All other systems reviewed and are negative.   BP 138/62 mmHg  Pulse 59  Ht 5' 5.5" (1.664 m)  Wt 158 lb (71.668 kg)  BMI 25.88 kg/m2  Physical Exam  Constitutional: She is oriented to person, place, and time. She appears well-developed and well-nourished.  HENT:  Head: Normocephalic.  Nose: Nose normal.  Mouth/Throat: Oropharynx is clear and moist.  Eyes: Conjunctivae are normal. Pupils are equal, round, and reactive to light.  Neck: Normal range of motion. Neck supple. No JVD present.  Cardiovascular: Normal rate, regular rhythm, S1 normal, S2 normal, normal heart sounds and intact distal pulses.  Exam reveals no gallop and no friction rub.   No murmur heard. Pulmonary/Chest: Effort normal and breath sounds normal. No respiratory distress. She has no wheezes. She has no rales. She exhibits no tenderness.  Abdominal: Soft. Bowel sounds are normal. She exhibits no distension. There is no tenderness.  Musculoskeletal: Normal range of motion. She exhibits no edema or tenderness.  Lymphadenopathy:    She has no cervical adenopathy.  Neurological: She is alert and oriented to person, place, and time. Coordination normal.  Skin: Skin is warm and dry.  No rash noted. No erythema.  Psychiatric: She has a normal mood and affect. Her behavior is normal. Judgment and thought content normal.    Assessment and Plan  Nursing note and vitals reviewed.

## 2015-09-09 NOTE — Assessment & Plan Note (Signed)
Blood pressure is well controlled on today's visit. No changes made to the medications. 

## 2015-09-09 NOTE — Assessment & Plan Note (Signed)
Cholesterol is at goal on the current lipid regimen. No changes to the medications were made.  

## 2015-09-09 NOTE — Assessment & Plan Note (Signed)
Possibly from ramipril Recommended she hold this, start losartan 50 mg daily. Prescription has been sent in

## 2015-09-13 ENCOUNTER — Telehealth: Payer: Self-pay

## 2015-09-13 NOTE — Telephone Encounter (Signed)
Pt returning your call regarding echo results.

## 2015-09-13 NOTE — Telephone Encounter (Signed)
Please see result note 

## 2015-11-07 ENCOUNTER — Ambulatory Visit: Payer: Medicare Other | Admitting: Cardiovascular Disease

## 2016-03-01 ENCOUNTER — Other Ambulatory Visit: Payer: Self-pay | Admitting: Internal Medicine

## 2016-03-01 DIAGNOSIS — Z1231 Encounter for screening mammogram for malignant neoplasm of breast: Secondary | ICD-10-CM

## 2016-03-17 IMAGING — MG MM SCREENING BREAST TOMO BILATERAL
9 of 12 series · 9 of 28 positions shown · non-contrast
Comparison: Previous exam(s).

CLINICAL DATA: Screening.

EXAM:
DIGITAL SCREENING BILATERAL MAMMOGRAM WITH 3D TOMO WITH CAD

[L CC]
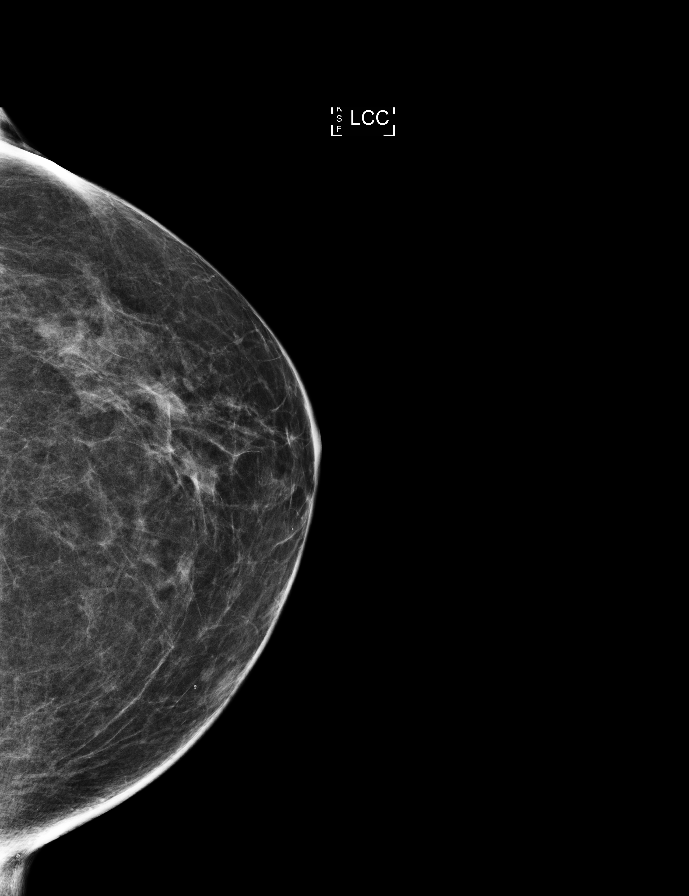

[R MLO synth-2D]
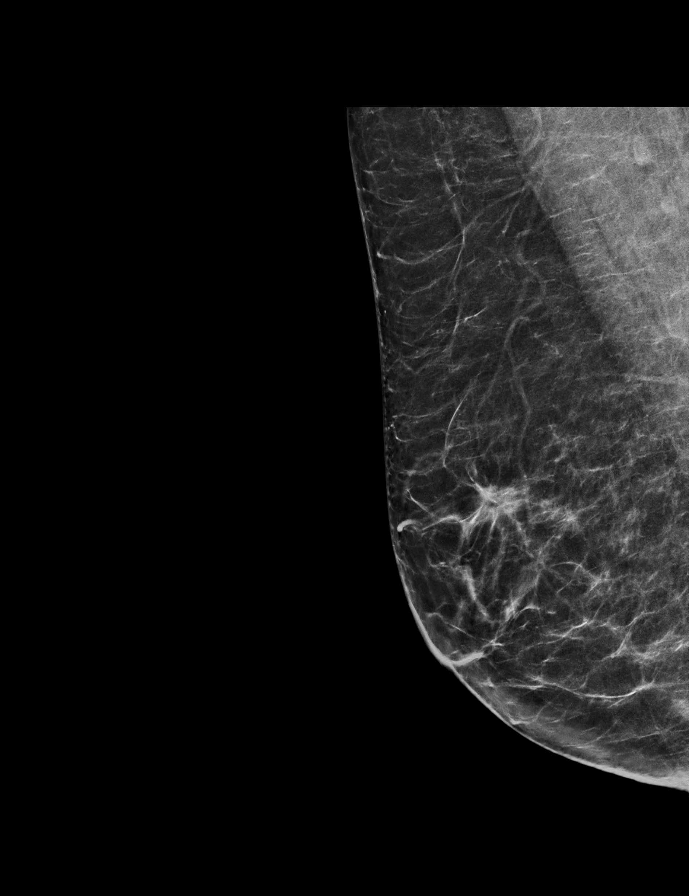

[L CC synth-2D]
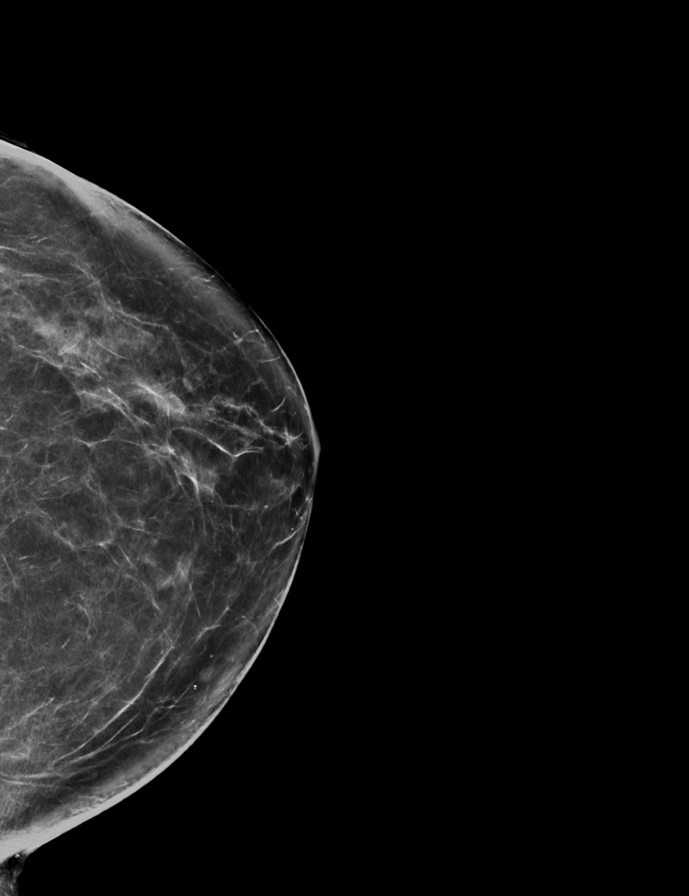

[R CC synth-2D]
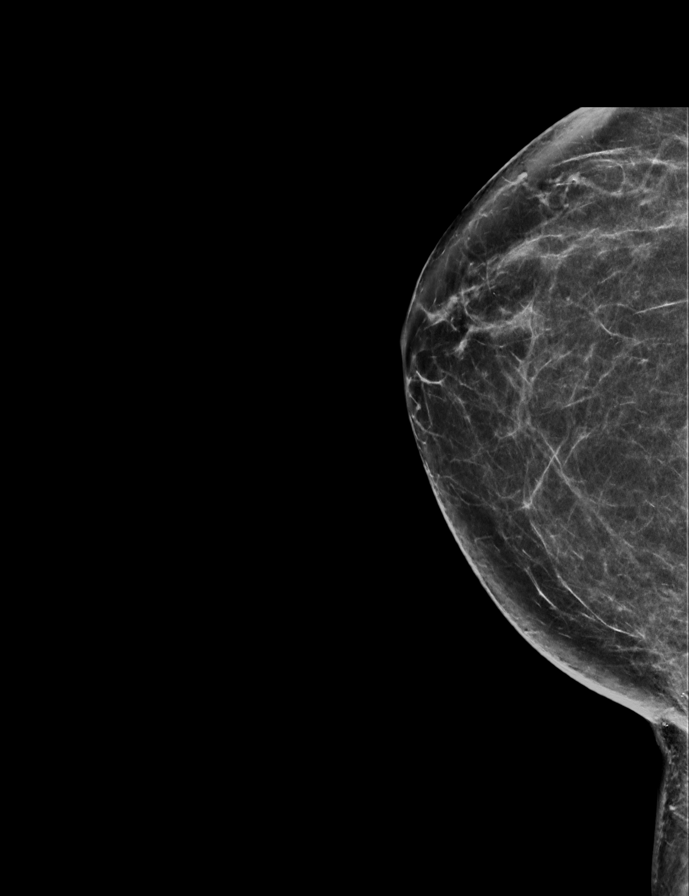

[L MLO synth-2D]
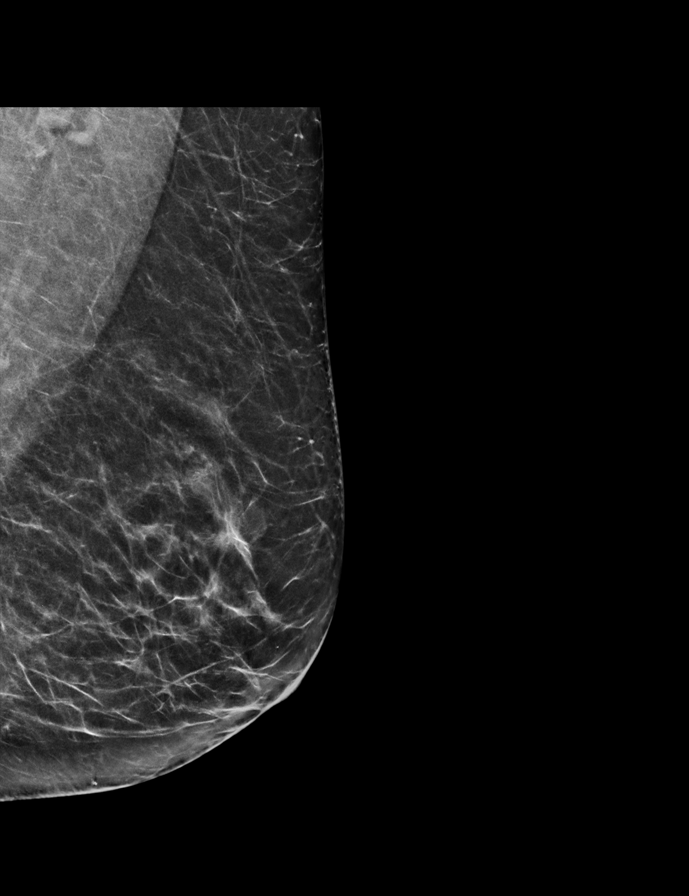

[R CC]
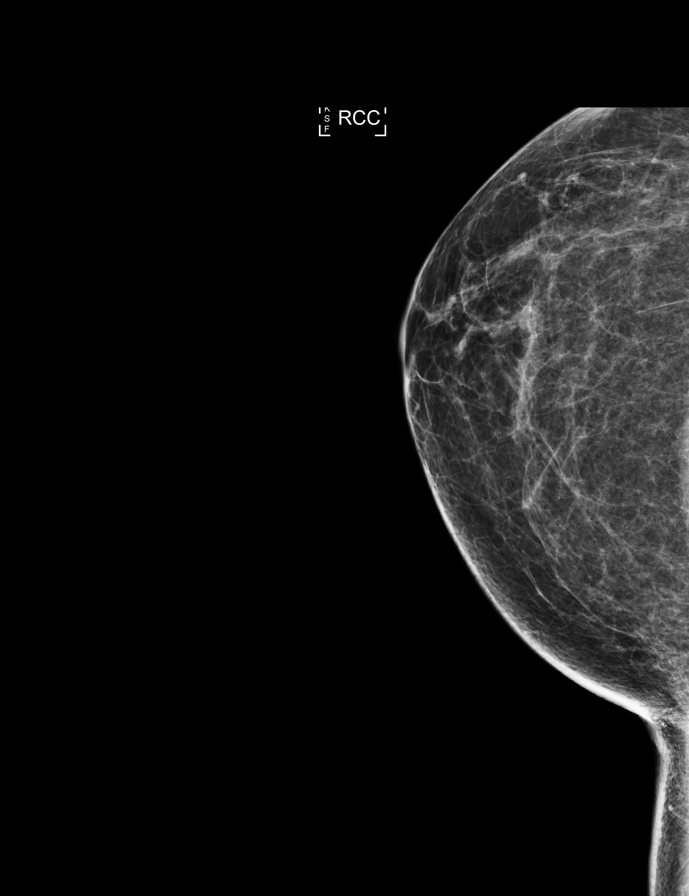

[L MLO]
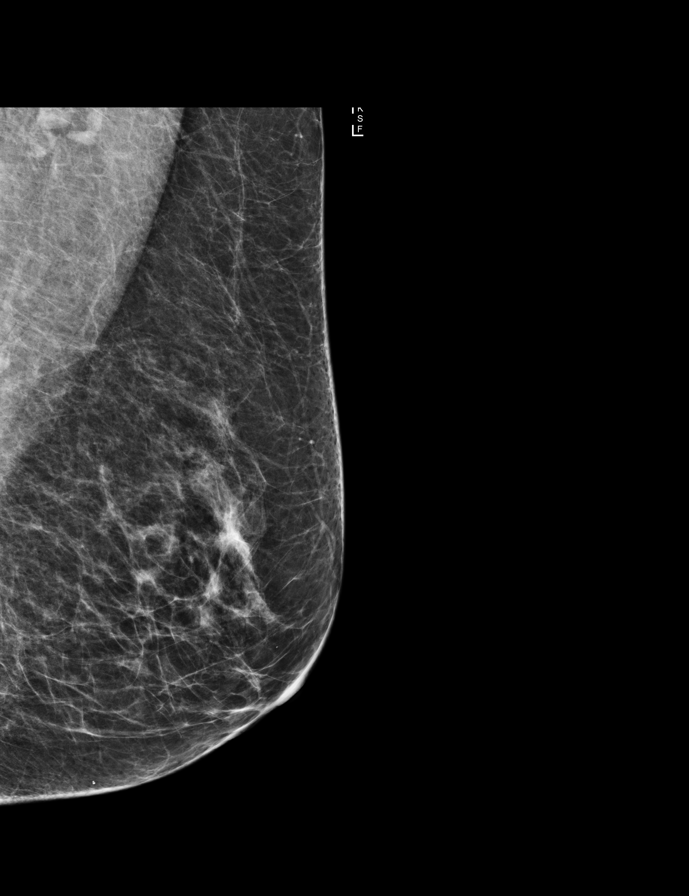

[R MLO]
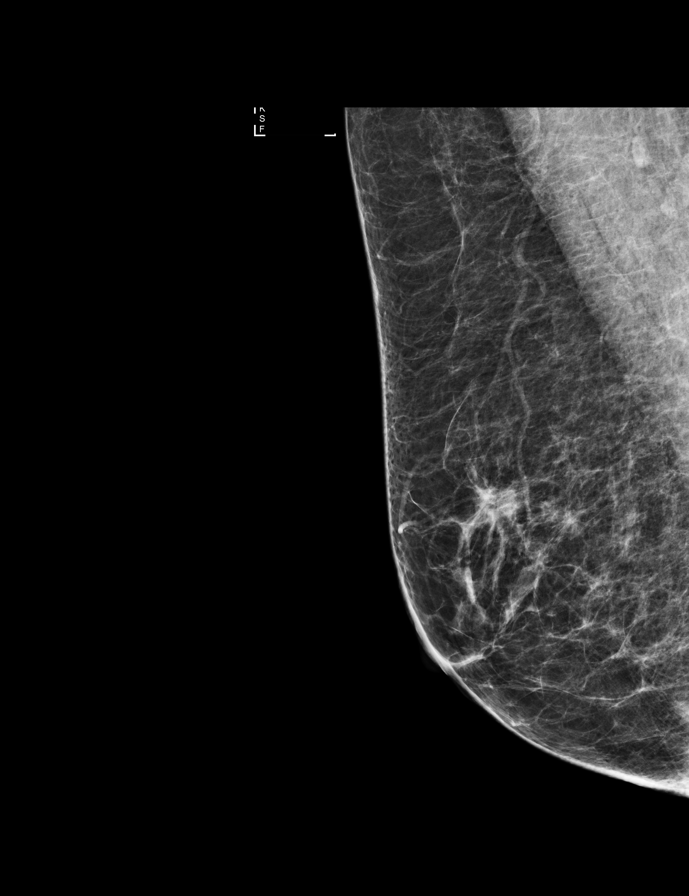

[L MLO tomo · tomo slice 34/67.0]
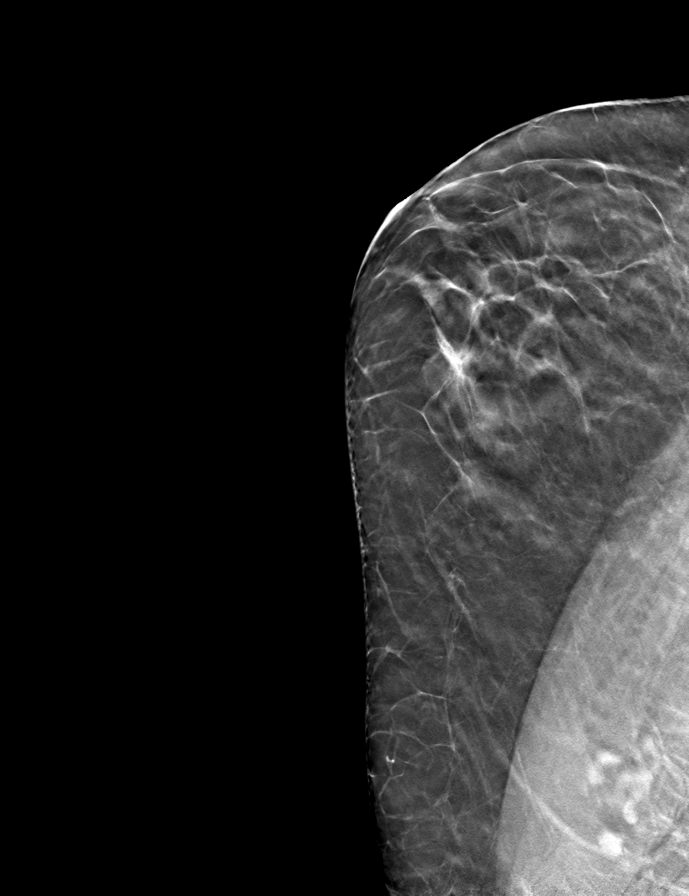

[9 of 28 positions shown; findings below may reference images not displayed]

ACR Breast Density Category b: There are scattered areas of
fibroglandular density.
FINDINGS: There are no findings suspicious for malignancy. Images were
processed with CAD.
IMPRESSION: No mammographic evidence of malignancy. A result letter of this
screening mammogram will be mailed directly to the patient.

RECOMMENDATION:
Screening mammogram in one year. (Code:55-L-23V)

BI-RADS CATEGORY  1: Negative.

## 2016-03-28 ENCOUNTER — Ambulatory Visit
Admission: RE | Admit: 2016-03-28 | Discharge: 2016-03-28 | Disposition: A | Discharge status: Home / Self Care | Payer: Medicare Other | Source: Ambulatory Visit | Attending: Internal Medicine | Admitting: Internal Medicine

## 2016-03-28 ENCOUNTER — Other Ambulatory Visit: Payer: Self-pay | Admitting: Internal Medicine

## 2016-03-28 DIAGNOSIS — Z1231 Encounter for screening mammogram for malignant neoplasm of breast: Secondary | ICD-10-CM

## 2016-09-10 ENCOUNTER — Ambulatory Visit (INDEPENDENT_AMBULATORY_CARE_PROVIDER_SITE_OTHER): Payer: Medicare Other | Admitting: Cardiovascular Disease

## 2016-09-10 ENCOUNTER — Encounter: Payer: Self-pay | Admitting: Cardiovascular Disease

## 2016-09-10 VITALS — BP 168/91 | HR 52 | Ht 65.5 in | Wt 157.8 lb

## 2016-09-10 DIAGNOSIS — E782 Mixed hyperlipidemia: Secondary | ICD-10-CM

## 2016-09-10 DIAGNOSIS — I10 Essential (primary) hypertension: Secondary | ICD-10-CM

## 2016-09-10 DIAGNOSIS — I429 Cardiomyopathy, unspecified: Secondary | ICD-10-CM

## 2016-09-10 DIAGNOSIS — R5382 Chronic fatigue, unspecified: Secondary | ICD-10-CM | POA: Diagnosis not present

## 2016-09-10 DIAGNOSIS — F419 Anxiety disorder, unspecified: Secondary | ICD-10-CM

## 2016-09-10 MED ORDER — AMLODIPINE BESYLATE 5 MG PO TABS: 5.0000 mg | ORAL_TABLET | Freq: Every day | ORAL | 3 refills | Status: DC

## 2016-09-10 NOTE — Progress Notes (Signed)
Cardiology Office Note  Date:  09/10/2016   ID:  Flannery C Robin Ortiz, DOB [birth year], MRN [id number]  PCP:  Jaclyn ShaggyATE,DENNY C, MD   Chief Complaint  Patient presents with  . other    50mo f/u. Pt c/o sob. Reviewed meds with pt verbally.    HPI:  Ms. Robin Ortiz is a pleasant 81 year-old woman with a history of hypertension, hyperlipidemia, remote history of nonischemic cardiomyopathy with initial ejection fraction 20-30% in 2002 that has improved to 50-55% on echocardiogram in March 2009, status post bilateral knee replacement who presents for routine follow up  Of her cardiomyopathy.  In follow-up today, she reports that she was doing well except has significant Stress at home son getting divorce, affecting the whole family Takes care of grandchildren does some exercise several days a week, denies any shortness of breath symptoms Had labs done Friday, 2 days ago Labs reviewed with her showing improved total cholesterol down to 140, LDL 64, glucose 119  Does not take lasix Repeat blood pressure 145/65 today  In the past has been traveling, eating poorly,weight gain Previous issues with Chronic fatigue By mouth she is asymptomatic from her bradycardia  EKG on today's visit shows normal sinus rhythm with rate 52 bpm, no significant ST or T-wave changes  Other past medical history She did a lifeline screening in the past that showed no significant carotid arterial disease, no AAA, no significant PAD. She is low risk for osteoporosis   Remote cardiac catheterization in 2002 showing mild RCA disease at the ostium.   PMH:   has a past medical history of Cardiomegaly; CHF (congestive heart failure) (HCC); Osteoporosis; Other and unspecified hyperlipidemia; Secondary cardiomyopathy, unspecified; and Unspecified essential hypertension.  PSH:    Past Surgical History:  Procedure Laterality Date  . ROTATOR CUFF REPAIR     left shoulder  . ruptured tendon right foot Right    surgical repair   . TOTAL KNEE ARTHROPLASTY Bilateral     Current Outpatient Prescriptions  Medication Sig Dispense Refill  . amLODipine (NORVASC) 5 MG tablet Take 1/2-1 tablet by mouth daily. 90 tablet 3  . aspirin 81 MG tablet Take 81 mg by mouth daily.      . Calcium-Vitamin D 600-200 MG-UNIT per tablet Take 1 tablet by mouth daily.      . carvedilol (COREG) 25 MG tablet Take 1 tablet (25 mg total) by mouth 2 (two) times daily with a meal. 180 tablet 3  . fexofenadine (ALLEGRA) 180 MG tablet Take 180 mg by mouth daily as needed. 1/2 to 1 tablet qd    . furosemide (LASIX) 20 MG tablet Take 1 tablet (20 mg total) by mouth daily as needed. 30 tablet 6  . losartan (COZAAR) 50 MG tablet Take 1 tablet (50 mg total) by mouth daily. 30 tablet 11  . Misc Natural Products (OSTEO BI-FLEX ADV JOINT SHIELD PO) Take 1 tablet by mouth daily.      . Multiple Vitamin (MULTIVITAMIN) tablet Take 1 tablet by mouth daily.      . Omega-3 Fatty Acids (FISH OIL) 1000 MG CAPS Take 1 capsule by mouth daily.      . raloxifene (EVISTA) 60 MG tablet Take 60 mg by mouth daily.      . simvastatin (ZOCOR) 20 MG tablet Take 20 mg by mouth at bedtime.       No current facility-administered medications for this visit.      Allergies:   Patient has no known allergies.  Social History:  The patient  reports that she has never smoked. She has never used smokeless tobacco. She reports that she drinks about 1.2 - 1.8 oz of alcohol per week . She reports that she does not use drugs.   Family History:   family history includes Breast cancer in her cousin; Breast cancer (age of onset: 57) in her maternal aunt; Breast cancer (age of onset: 26) in her maternal grandmother; Heart disease in her father.    Review of Systems: Review of Systems  Constitutional: Negative.   Respiratory: Negative.   Cardiovascular: Negative.   Gastrointestinal: Negative.   Musculoskeletal: Negative.   Neurological: Negative.   Psychiatric/Behavioral: The  patient is nervous/anxious.   All other systems reviewed and are negative.    PHYSICAL EXAM: VS:  BP (!) 168/91 (BP Location: Left Arm, Patient Position: Sitting, Cuff Size: Normal)   Pulse (!) 52   Ht 5' 5.5" (1.664 m)   Wt 157 lb 12 oz (71.6 kg)   BMI 25.85 kg/m  , BMI Body mass index is 25.85 kg/m. GEN: Well nourished, well developed, in no acute distress  HEENT: normal  Neck: no JVD, carotid bruits, or masses Cardiac: RRR; no murmurs, rubs, or gallops,no edema  Respiratory:  clear to auscultation bilaterally, normal work of breathing GI: soft, nontender, nondistended, + BS MS: no deformity or atrophy  Skin: warm and dry, no rash Neuro:  Strength and sensation are intact Psych: euthymic mood, full affect  Recent Labs: No results found for requested labs within last 8760 hours.    Lipid Panel No results found for: CHOL, HDL, LDLCALC, TRIG    Wt Readings from Last 3 Encounters:  09/10/16 157 lb 12 oz (71.6 kg)  09/09/15 158 lb (71.7 kg)  11/17/14 157 lb 4 oz (71.3 kg)       ASSESSMENT AND PLAN:  Mixed hyperlipidemia Cholesterol is at goal on the current lipid regimen. No changes to the medications were made.  HYPERTENSION, BENIGN Blood pressure elevated, she feels this is secondary to brushing this morning and stress Typically blood pressure well controlled at home  Secondary cardiomyopathy (HCC) Appears euvolemic on today's visit, no further workup needed  Chronic fatigue Likely secondary to stressors at home, poor sleep Recommended she continue her exercise  Anxiety significant stressors at home   Total encounter time more than 25 minutes  Greater than 50% was spent in counseling and coordination of care with the patient  Disposition:   F/U  12 months  No orders of the defined types were placed in this encounter.    Signed, Dossie Arbour, M.D., Ph.D. 09/10/2016  [facility] [place], [place] [phone]

## 2016-09-10 NOTE — Patient Instructions (Signed)

## 2016-09-18 ENCOUNTER — Other Ambulatory Visit: Payer: Self-pay | Admitting: Cardiovascular Disease

## 2016-12-13 ENCOUNTER — Other Ambulatory Visit: Payer: Self-pay | Admitting: Cardiovascular Disease

## 2017-03-04 ENCOUNTER — Other Ambulatory Visit: Payer: Self-pay | Admitting: Cardiovascular Disease

## 2017-03-08 ENCOUNTER — Other Ambulatory Visit: Payer: Self-pay | Admitting: Internal Medicine

## 2017-03-08 DIAGNOSIS — Z1231 Encounter for screening mammogram for malignant neoplasm of breast: Secondary | ICD-10-CM

## 2017-03-29 ENCOUNTER — Ambulatory Visit
Admission: RE | Admit: 2017-03-29 | Discharge: 2017-03-29 | Disposition: A | Discharge status: Home / Self Care | Payer: Medicare Other | Source: Ambulatory Visit | Attending: Internal Medicine | Admitting: Internal Medicine

## 2017-03-29 DIAGNOSIS — Z1231 Encounter for screening mammogram for malignant neoplasm of breast: Secondary | ICD-10-CM | POA: Diagnosis not present

## 2017-05-14 ENCOUNTER — Other Ambulatory Visit: Payer: Self-pay | Admitting: Cardiovascular Disease

## 2017-05-28 ENCOUNTER — Telehealth: Payer: Self-pay | Admitting: Cardiovascular Disease

## 2017-05-28 NOTE — Telephone Encounter (Signed)
Patient states that she was on a 14 day vacation trip and had swelling in her lower legs. She reports that swelling did get better when she got up the next day. She reported more walking during her trip and just noticed she was more tired. No swelling since being home but does report being "short winded". Advised her to try compression hose/socks and elevate legs when home. Also instructed her to monitor diet and try to make sure it is low in sodium. Patient wants to call back later to schedule next available appointment with Dr. Mariah Milling. Instructed her to please call back if her symptoms persist or worsen prior to appointment. She verbalized understanding of our conversation and had no further questions or concerns.

## 2017-05-28 NOTE — Telephone Encounter (Signed)
Pt calling stating she just got back from back a trip It required a lot more walking  She is calling stating she had some swelling and more sob  She is fine at the moment just would like to know is this normal for her She is not sure if it is her age or not  Please advise.

## 2017-06-12 ENCOUNTER — Other Ambulatory Visit: Payer: Self-pay | Admitting: Cardiovascular Disease

## 2017-06-30 NOTE — Progress Notes (Signed)
Cardiology Office Note  Date:  07/01/2017   ID:  Robin Ortiz, DOB 1934-11-15, MRN [id number]  PCP:  Robin Shaggy, MD   Chief Complaint  Patient presents with  . other    Early 12 month follow up for Swelling and SOB. Meds reviewed verbally with patient.     HPI:  [name] is a pleasant 82 year-old woman with a history of  hypertension,  hyperlipidemia,  remote history of nonischemic cardiomyopathy with initial ejection fraction 20-30% in 2002 that has improved to 50-55% on echocardiogram in March 2009,  status post bilateral knee replacement  who presents for routine follow up  Of her cardiomyopathy.  Very fatigued in the afternoon Did cruise in [place] Was having SOB, Also [place] Leg swollen "every night"   She has felt well since she has been home, leg swelling resolved Does water aerobics twice a week and feels fine Attributes previous shortness of breath to the [facility] and conditioning  Stress at home, son getting divorce, affecting the whole family Takes care of grandchildren  Previous issues with Chronic fatigue    EKG on today's visit shows normal sinus rhythm with rate 49 bpm, no significant ST or T-wave changes  Other past medical history She did a lifeline screening in the past that showed no significant carotid arterial disease, no AAA, no significant PAD. She is low risk for osteoporosis   Remote cardiac catheterization in 2002 showing mild RCA disease at the ostium.   PMH:   has a past medical history of Cardiomegaly; CHF (congestive heart failure) (HCC); Osteoporosis; Other and unspecified hyperlipidemia; Secondary cardiomyopathy, unspecified; and Unspecified essential hypertension.  PSH:    Past Surgical History:  Procedure Laterality Date  . ROTATOR CUFF REPAIR     left shoulder  . ruptured tendon right foot Right    surgical repair  . TOTAL KNEE ARTHROPLASTY Bilateral     Current Outpatient Prescriptions   Medication Sig Dispense Refill  . amLODipine (NORVASC) 5 MG tablet Take 1 tablet (5 mg total) by mouth daily. 90 tablet 3  . aspirin 81 MG tablet Take 81 mg by mouth daily.      . Calcium-Vitamin D 600-200 MG-UNIT per tablet Take 1 tablet by mouth daily.      . carvedilol (COREG) 12.5 MG tablet Take 1 tablet (12.5 mg total) by mouth 2 (two) times daily with a meal. 180 tablet 3  . fexofenadine (ALLEGRA) 180 MG tablet Take 180 mg by mouth daily as needed. 1/2 to 1 tablet qd    . furosemide (LASIX) 20 MG tablet Take 1 tablet (20 mg total) by mouth daily as needed. 30 tablet 0  . losartan (COZAAR) 50 MG tablet Take 1 tablet (50 mg total) by mouth daily. 30 tablet 3  . Misc Natural Products (OSTEO BI-FLEX ADV JOINT SHIELD PO) Take 1 tablet by mouth daily.      . Multiple Vitamin (MULTIVITAMIN) tablet Take 1 tablet by mouth daily.      . Omega-3 Fatty Acids (FISH OIL) 1000 MG CAPS Take 1 capsule by mouth daily.      . raloxifene (EVISTA) 60 MG tablet Take 60 mg by mouth daily.      . simvastatin (ZOCOR) 20 MG tablet Take 20 mg by mouth at bedtime.       No current facility-administered medications for this visit.      Allergies:   Patient has no known allergies.   Social History:  The patient  reports that she has never smoked. She has never used smokeless tobacco. She reports that she drinks about 1.2 - 1.8 oz of alcohol per week . She reports that she does not use drugs.   Family History:   family history includes Breast cancer in her cousin; Breast cancer (age of onset: 74) in her maternal aunt; Breast cancer (age of onset: 36) in her maternal grandmother; Heart disease in her father.    Review of Systems: Review of Systems  Constitutional: Positive for malaise/fatigue.  Respiratory: Positive for shortness of breath.   Cardiovascular: Negative.   Gastrointestinal: Negative.   Musculoskeletal: Negative.   Neurological: Negative.   Psychiatric/Behavioral: Negative.   All other systems  reviewed and are negative.    PHYSICAL EXAM: VS:  BP (!) 151/73 (BP Location: Left Arm, Patient Position: Sitting, Cuff Size: Normal)   Pulse (!) 49   Ht  (1.651 m)   Wt 157 lb (71.2 kg)   BMI 26.13 kg/m  , BMI Body mass index is 26.13 kg/m.  No change from previous exam GEN: Well nourished, well developed, in no acute distress  HEENT: normal  Neck: no JVD, carotid bruits, or masses Cardiac: RRR; no murmurs, rubs, or gallops,no edema  Respiratory:  clear to auscultation bilaterally, normal work of breathing GI: soft, nontender, nondistended, + BS MS: no deformity or atrophy  Skin: warm and dry, no rash Neuro:  Strength and sensation are intact Psych: euthymic mood, full affect  Recent Labs: No results found for requested labs within last 8760 hours.    Lipid Panel No results found for: CHOL, HDL, LDLCALC, TRIG    Wt Readings from Last 3 Encounters:  07/01/17 157 lb (71.2 kg)  09/10/16 157 lb 12 oz (71.6 kg)  09/09/15 158 lb (71.7 kg)       ASSESSMENT AND PLAN:  Mixed hyperlipidemia We have requested lab work from primary care Continue statin  HYPERTENSION, BENIGN Recommended she decrease carvedilol down to 12.5 mg twice a day If she continues to have low heart rate may need to decrease down to 6.25 mg twice a day If blood pressure runs higher we will increase losartan up to 100 mg The above was discussed with her  Secondary cardiomyopathy (HCC) Appears euvolemic on today's visit, no further workup needed  Chronic fatigue Likely secondary to stressors at home, poor sleep Recommended she continue her exercise  Anxiety significant stressors at home Discussed with her, son going through divorce Affecting the grandchildren   Total encounter time more than 25 minutes  Greater than 50% was spent in counseling and coordination of care with the patient  Disposition:   F/U  12 months   Orders Placed This Encounter  Procedures  . EKG 12-Lead      Signed, Robin Ortiz, M.D., Ph.D. 07/01/2017  [facility] [place], [place] [phone]

## 2017-07-01 ENCOUNTER — Ambulatory Visit (INDEPENDENT_AMBULATORY_CARE_PROVIDER_SITE_OTHER): Payer: Medicare Other | Admitting: Cardiovascular Disease

## 2017-07-01 ENCOUNTER — Encounter: Payer: Self-pay | Admitting: Cardiovascular Disease

## 2017-07-01 VITALS — BP 151/73 | HR 49 | Ht 65.0 in | Wt 157.0 lb

## 2017-07-01 DIAGNOSIS — I429 Cardiomyopathy, unspecified: Secondary | ICD-10-CM

## 2017-07-01 DIAGNOSIS — I10 Essential (primary) hypertension: Secondary | ICD-10-CM

## 2017-07-01 DIAGNOSIS — E782 Mixed hyperlipidemia: Secondary | ICD-10-CM

## 2017-07-01 MED ORDER — CARVEDILOL 12.5 MG PO TABS: 12.5000 mg | ORAL_TABLET | Freq: Two times a day (BID) | ORAL | 3 refills | Status: DC

## 2017-07-01 NOTE — Patient Instructions (Addendum)
Medication Instructions:   Please decrease the coreg down to 12.5 mg twice a day (1/2 pill of the 25 mg twice a day)  Monitor heart rate and blood pressure CAll with numbers If blood pressure runs high, we would increase the losartan up to 100 mg daily   Labwork:  No new labs needed  Testing/Procedures:  No further testing at this time   Follow-Up: It was a pleasure seeing you in the office today. Please call us if you have new issues that need to be addressed before your next appt.  [phone]  Your physician wants you to follow-up in: 12 months.  You will receive a reminder letter in the mail two months in advance. If you don't receive a letter, please call our office to schedule the follow-up appointment.  If you need a refill on your cardiac medications before your next appointment, please call your pharmacy.

## 2017-07-08 ENCOUNTER — Telehealth: Payer: Self-pay | Admitting: Cardiovascular Disease

## 2017-07-08 NOTE — Telephone Encounter (Signed)
Pt c/o BP issue: STAT if pt c/o blurred vision, one-sided weakness or slurred speech  1. What are your last 5 BP readings?   10/5 7am 182/88 Pul 58 11pm 159/76 pul 51 10/6 9am 166/81 pul 56 11pm 147/74 pul 62 10/7 7am 176/82 pul 56 11pm 158/90 pul 57 10/8 7am 181/82 pul 55 9am 163/68 pul 61  2. Are you having any other symptoms (ex. Dizziness, headache, blurred vision, passed out)?  No  3. What is your BP issue? Has been jumping around a lot  Saw Dr Mariah Milling on Monday 10/1 and medication was cut in half so was asked to keep readings of BP Please call to advise

## 2017-07-08 NOTE — Telephone Encounter (Signed)
S/w patient.  She was told last office visit on 07/01/17 with Dr Mariah Milling to monitor BP/HR. She denies chest pain, dizziness, shortness of breath or palpitations.  10/5 7am 182/88 Pul 58 11pm 159/76 pul 51 10/6 9am 166/81 pul 56 11pm 147/74 pul 62 10/7 7am 176/82 pul 56 11pm 158/90 pul 57 10/8 7am 181/82 pul 55 9am 163/68 pul 61  Dr. Mariah Milling stated "Please decrease the coreg down to 12.5 mg twice a day (1/2 pill of the 25 mg twice a day)  Monitor heart rate and blood pressure CAll with numbers If blood pressure runs high, we would increase the losartan up to 100 mg daily"  Patient verbalized understanding to continue to monitor BP/HR. Will route to Dr. Mariah Milling for advice.

## 2017-07-09 ENCOUNTER — Other Ambulatory Visit: Payer: Self-pay

## 2017-07-09 MED ORDER — LOSARTAN POTASSIUM 100 MG PO TABS: 100.0000 mg | ORAL_TABLET | Freq: Every day | ORAL | 1 refills | Status: DC

## 2017-07-09 NOTE — Telephone Encounter (Addendum)
Reviewed recommendations w/pt who verbalized understanding. She will take losartan (2) 50mg  tablets qd until she is out at home. She understands refill will be a 100mg  tablet, taking (1) once a day.  She will continue to monitor pressures and call if it is not improved for consideration of increasing amlodipine. New prescription sent to [facility].

## 2017-07-09 NOTE — Telephone Encounter (Signed)
Would increase losartan up to 100 mg daily IF BP continues to run high after one week May need to increase amlodipine up to 10 mg daily

## 2017-09-13 ENCOUNTER — Encounter: Payer: Self-pay | Admitting: Cardiovascular Disease

## 2017-09-13 NOTE — Telephone Encounter (Signed)
This encounter was created in error - please disregard.

## 2017-09-27 ENCOUNTER — Other Ambulatory Visit: Payer: Self-pay | Admitting: Cardiovascular Disease

## 2017-11-14 ENCOUNTER — Ambulatory Visit
Admission: RE | Admit: 2017-11-14 | Discharge: 2017-11-14 | Disposition: A | Discharge status: Home / Self Care | Payer: Medicare Other | Source: Ambulatory Visit | Attending: Internal Medicine | Admitting: Internal Medicine

## 2017-11-14 ENCOUNTER — Other Ambulatory Visit: Payer: Self-pay | Admitting: Internal Medicine

## 2017-11-14 DIAGNOSIS — R05 Cough: Secondary | ICD-10-CM | POA: Insufficient documentation

## 2017-11-14 DIAGNOSIS — R059 Cough, unspecified: Secondary | ICD-10-CM

## 2018-01-20 ENCOUNTER — Other Ambulatory Visit: Payer: Self-pay | Admitting: Cardiovascular Disease

## 2018-02-21 ENCOUNTER — Other Ambulatory Visit: Payer: Self-pay | Admitting: *Deleted

## 2018-02-21 ENCOUNTER — Ambulatory Visit (INDEPENDENT_AMBULATORY_CARE_PROVIDER_SITE_OTHER): Payer: Medicare Other | Admitting: *Deleted

## 2018-02-21 VITALS — BP 110/60 | HR 75 | Ht 65.5 in | Wt 154.2 lb

## 2018-02-21 DIAGNOSIS — I499 Cardiac arrhythmia, unspecified: Secondary | ICD-10-CM | POA: Diagnosis not present

## 2018-02-21 MED ORDER — CARVEDILOL 12.5 MG PO TABS: 6.2500 mg | ORAL_TABLET | Freq: Two times a day (BID) | ORAL | Status: DC

## 2018-02-21 MED ORDER — AMLODIPINE BESYLATE 5 MG PO TABS: 2.5000 mg | ORAL_TABLET | Freq: Every day | ORAL | Status: DC

## 2018-02-21 NOTE — Progress Notes (Signed)
1.) Reason for visit: EKG  2.) Name of MD requesting visit: Gollan/ Tate (PCP)  3.) H&P: The patient has a history of hypertension, cardiomyopathy, and hyperlipidemia. She states that she has recently had a virus in the last few weeks that resulted in her losing her hearing in her right ear. She was on a steroid taper. The patient went to see her PCP today due to fatigue that she was experiencing. Dr. Arlana Pouch had put a call in to Dr. Mariah Milling requesting and EKG be done today due to her heart skipping every 5th beat. Per the patient, Dr. Maree Krabbe EKG machine was not working today.   4.) ROS related to problem: The patient states that she has only had fatigue lately and feels like she just can't do anything. She has noted some occasional lower readings of her BP at home ~110 systolic. She does not check this everyday as she was doing this and got frustrated because her BP was always high first thing in the morning.   5.) Assessment and plan per MD: EKG obtained and reviewed with Dr. Mariah Milling- the patient is in NSR with occasional PVC's with a HR of 75 bpm. Her BP in clinic today is 110/60. She confirms she is taking amlodipine 5 mg once daily, coreg 12.5 mg BID, and losartan 100 mg once daily. Per Dr. Mariah Milling, he feels the patient's EKG and BP may not be what is really driving her symptoms of fatigue, but recommendations received to decrease coreg to 6.25 mg BID and decrease amlodipine to 2.5 mg once daily. I have advised the patient of MD recommendations. I have also advised that she decrease 1 medication at a time. She will start with decreasing amlodipine to 2.5 mg once daily and keep an eye on her BP readings. If her fatigue improves and BP ok, she can maintain her current dose of coreg. However, if she continues to have fatigue and her BP is ok, she will then decrease coreg to 6.25 mg BID. She has been advised to check her BP in the mornings about 30 minutes- 1 hour after she takes her morning meds. She will keep  [place] updated on how she is doing. Per the patient, she is due to follow up with Dr. Arlana Pouch in about a month. She will see Dr. Mariah Milling in October unless she or Dr. Arlana Pouch feels she needs to come back sooner. The patient is agreeable and voices understanding.

## 2018-02-21 NOTE — Patient Instructions (Addendum)
Medication Instructions: - Your physician has recommended you make the following change in your medication:  1) DECREASE amlodipine 5 mg- take 1/2 tablet (2.5 mg) once daily 2) DECREASE carvedilol 12.5 mg- take 1/2 tablet (6.25 mg) twice daily  (it is ok to try cutting back the the amlodipine 1st to see how your blood pressure does and if still ok after a few days, then you can cut the carvedilol in half- continue to monitor your symptoms.   Labwork: - none ordered  Procedures/Testing: - none ordered  Follow-Up: -    Any Additional Special Instructions Will Be Listed Below (If Applicable).     If you need a refill on your cardiac medications before your next appointment, please call your pharmacy.     Premature Ventricular Contraction A premature ventricular contraction (PVC) is a common irregularity in the normal heart rhythm. These contractions are extra heartbeats that start in the heart ventricles and occur too early in the normal sequence. During the PVC, the heart's normal electrical pathway is not used, so the beat is shorter and less effective. In most cases, these contractions come and go and do not require treatment. What are the causes? In many cases, the cause may not be known. Common causes of the condition include:  Smoking.  Drinking alcohol.  Caffeine.  Certain medicines.  Some illegal drugs.  Stress.  Certain medical conditions can also cause PVCs:  Changes in minerals in the blood (electrolytes).  Heart failure.  Heart valve problems.  Low blood oxygen levels or high carbon dioxide levels.  Heart attack, or coronary artery disease.  What are the signs or symptoms? The main symptom of this condition is a fast or skipped heartbeat (palpitations). Other symptoms include:  Chest pain.  Shortness of breath.  Feeling tired.  Dizziness.  In some cases, there are no symptoms. How is this diagnosed? This condition may be diagnosed based  on:  Your medical history.  A physical exam. During the exam, the health care provider will check for irregular heartbeats.  Tests, such as: ? An ECG (electrocardiogram) to monitor the electrical activity of your heart. ? Holter monitor testing. This involves wearing a device that clips to your clothing and monitors the electrical activity of your heart over longer periods of time. ? Stress tests to see how exercise affects your heart rhythm and blood supply. ? Echocardiogram. This test uses sound waves (ultrasound) to produce an image of your heart. ? Electrophysiology study. This test checks the electric pathways in your heart.  How is this treated? Treatment depends on any underlying conditions, the type of PVCs that you are having, and how much the symptoms are interfering with your daily life. Possible treatments include:  Avoiding things that can trigger the premature contractions, such as caffeine or alcohol.  Medicines. These may be given if symptoms are severe or if the extra heartbeats are frequent.  Treatment for any underlying condition that is found to be the cause of the contractions.  Catheter ablation. This procedure destroys the heart tissues that send abnormal signals.  In some cases, no treatment is required. Follow these instructions at home: Lifestyle Follow these instructions as told by your health care provider:  Do not use any products that contain nicotine or tobacco, such as cigarettes and e-cigarettes. If you need help quitting, ask your health care provider.  If caffeine triggers episodes of PVC, do not eat, drink, or use anything with caffeine in it.  If caffeine does not  seem to trigger episodes, consume caffeine in moderation.  If alcohol triggers episodes of PVC, do not drink alcohol.  If alcohol does not seem to trigger episodes, limit alcohol intake to no more than 1 drink a day for nonpregnant women and 2 drinks a day for men. One drink equals  12 oz of beer, 5 oz of wine, or 1 oz of hard liquor.  Exercise regularly. Ask your health care provider what type of exercise is safe for you.  Find healthy ways to manage stress. Avoid stressful situations when possible.  Try to get at least 7-9 hours of sleep each night, or as much as recommended by your health care provider.  Do not use illegal drugs.  General instructions  Take over-the-counter and prescription medicines only as told by your health care provider.  Keep all follow-up visits as told by your health care provider. This is important. Get help right away if:  You feel palpitations that are frequent or continual.  You have chest pain.  You have shortness of breath.  You have sweating for no reason.  You have nausea and vomiting.  You become light-headed or you faint. This information is not intended to replace advice given to you by your health care provider. Make sure you discuss any questions you have with your health care provider. Document Released: 05/04/2004 Document Revised: 05/11/2016 Document Reviewed: 02/22/2016 Elsevier Interactive Patient Education  [employer].

## 2018-03-04 ENCOUNTER — Telehealth: Payer: Self-pay | Admitting: Cardiovascular Disease

## 2018-03-04 NOTE — Telephone Encounter (Signed)
I called and spoke with the patient. She was in for a nurse visit on 02/21/18 for a follow up EKG per Dr. Arlana Pouch Dr. Mariah Milling.  The patient's BP at the time was 110/60 and her EKG showed rare PVC's. She was experiencing a lot of fatigue at the time. Dr. Mariah Milling advised that the patient could try cutting her amlodipine in half as well as her carvedilol to see if this would help with her symptoms. I had advised the patient to decrease 1 medication at a time and monitor her BP readings.  On 5/26, the patient reports she decreased amlodipine to 2.5 mg once daily and her BP has been as follows:  5/26- 142/63, 129/66, 134/62 5/27- (11:30 am)- 129/57, 126/55 5/28- 136/61 5/29- 149/72, (5:00 pm) 144/67 5/30- 142/60, (12:45 pm) SBP- 120 5/31- 154/70, 156/67  She did try to cut back on her coreg to 6.25 mg BID for a few days during this time, but she has not had any improvement in her fatigue. She states her BP readings at home were in the 140's systolic prior to [place] making her med changes on 5/24. I have advised her to resume amlodipine to 5 mg once daily, and resume coreg to 12.5 mg BID.  She will periodically monitor her BP. She is scheduled to follow up with Dr. Arlana Pouch on 03/19/18. I have also advised her to take her recorded BP readings along with her BP cuff (for correlation) to her appointment with Dr. Arlana Pouch. She voices understanding and is agreeable.

## 2018-03-04 NOTE — Telephone Encounter (Signed)
Patient was in for nurse visit on 5/24 - would like to speak with Robin Ortiz if possible  Patient has questions regarding medication, she does not feel like it is going the way it is suppose to  Please call to discuss

## 2018-03-04 NOTE — Addendum Note (Signed)
Addended by: Sherri Rad C on: 03/04/2018 05:24 PM   Modules accepted: Orders

## 2018-03-19 ENCOUNTER — Other Ambulatory Visit: Payer: Self-pay | Admitting: Internal Medicine

## 2018-03-19 DIAGNOSIS — Z1231 Encounter for screening mammogram for malignant neoplasm of breast: Secondary | ICD-10-CM

## 2018-03-30 ENCOUNTER — Other Ambulatory Visit: Payer: Self-pay | Admitting: Cardiovascular Disease

## 2018-05-11 NOTE — Progress Notes (Signed)
Cardiology Office Note  Date:  05/12/2018   ID:  Robin Ortiz, DOB 29-Apr-1935, MRN [id number]  PCP:  Robin Shaggy, MD   Chief Complaint  Patient presents with  . Orders    12 mo follow up med review,fatigue, medications verbally reviewed.    HPI:  Robin Ortiz is a pleasant 83 year-old woman with a history of  hypertension,  hyperlipidemia,  remote history of nonischemic cardiomyopathy with initial ejection fraction 20-30% in 2002 that has improved to 50-55% on echocardiogram in March 2009,  status post bilateral knee replacement  Anxiety/gets stressed, chronic fatigue who presents for routine follow up  Of her cardiomyopathy.  Left ear January 14, 2018:  "ear went dead", acutely Saw ENT "audio bad" Virus?  Started on steroids, knees weak, one episode she fell over [place] to [employer] came back while listening to a storm Had some tinnitus, better  Pressures running high in the office, at home today 140/70 On avg 130s to 140s systolic, rare 150 to 160  On her last clinic visit, coreg decreased down to 12.5 BID Heart rate on last clinic visit 49, up to 55 today  Still traveling, mountains, lots of walking Sometimes water aerobics Chronic fatigue Stress at home, son divorced, affecting the whole family Takes care of grandchildren  EKG personally reviewed by myself on todays visit shows normal sinus rhythm with rate 55 bpm, no significant ST or T-wave changes  Other past medical history She did a lifeline screening in the past that showed no significant carotid arterial disease, no AAA, no significant PAD. She is low risk for osteoporosis   Remote cardiac catheterization in 2002 showing mild RCA disease at the ostium.   PMH:   has a past medical history of Cardiomegaly, CHF (congestive heart failure) (HCC), Osteoporosis, Other and unspecified hyperlipidemia, Secondary cardiomyopathy, unspecified, and Unspecified essential hypertension.  PSH:    Past Surgical  History:  Procedure Laterality Date  . ROTATOR CUFF REPAIR     left shoulder  . ruptured tendon right foot Right    surgical repair  . TOTAL KNEE ARTHROPLASTY Bilateral     Current Outpatient Medications  Medication Sig Dispense Refill  . amLODipine (NORVASC) 5 MG tablet Take 1 tablet (5 mg total) by mouth daily. 90 tablet 0  . aspirin 81 MG tablet Take 81 mg by mouth daily.      . Calcium-Vitamin D 600-200 MG-UNIT per tablet Take 1 tablet by mouth daily.      . carvedilol (COREG) 12.5 MG tablet Take 12.5 mg by mouth 2 (two) times daily with a meal.    . fexofenadine (ALLEGRA) 180 MG tablet Take 180 mg by mouth daily as needed. 1/2 to 1 tablet qd    . furosemide (LASIX) 20 MG tablet Take 1 tablet (20 mg total) by mouth daily as needed. 30 tablet 0  . losartan (COZAAR) 100 MG tablet Take 1 tablet (100 mg total) by mouth daily. 90 tablet 0  . Misc Natural Products (OSTEO BI-FLEX ADV JOINT SHIELD PO) Take 1 tablet by mouth daily.      . Multiple Vitamin (MULTIVITAMIN) tablet Take 1 tablet by mouth daily.      . Omega-3 Fatty Acids (FISH OIL) 1000 MG CAPS Take 1 capsule by mouth daily.      . raloxifene (EVISTA) 60 MG tablet Take 60 mg by mouth daily.      . simvastatin (ZOCOR) 20 MG tablet Take 20 mg by mouth at bedtime.  No current facility-administered medications for this visit.      Allergies:   Patient has no known allergies.   Social History:  The patient  reports that she has never smoked. She has never used smokeless tobacco. She reports that she drinks about 2.0 - 3.0 standard drinks of alcohol per week. She reports that she does not use drugs.   Family History:   family history includes Breast cancer in her cousin; Breast cancer (age of onset: 63) in her maternal aunt; Breast cancer (age of onset: 50) in her maternal grandmother; Heart disease in her father.    Review of Systems: Review of Systems  Constitutional: Positive for malaise/fatigue.  Respiratory: Positive  for shortness of breath.   Cardiovascular: Negative.   Gastrointestinal: Negative.   Musculoskeletal: Negative.   Neurological: Negative.   Psychiatric/Behavioral: Negative.   All other systems reviewed and are negative.    PHYSICAL EXAM: VS:  BP (!) 176/70 (BP Location: Left Arm, Patient Position: Sitting, Cuff Size: Normal)   Pulse (!) 55   Ht 5' 5.5" (1.664 m)   Wt 155 lb 12.8 oz (70.7 kg)   BMI 25.53 kg/m  , BMI Body mass index is 25.53 kg/m.  No change from previous exam GEN: Well nourished, well developed, in no acute distress  HEENT: normal  Neck: no JVD, carotid bruits, or masses Cardiac: RRR; no murmurs, rubs, or gallops,no edema  Respiratory:  clear to auscultation bilaterally, normal work of breathing GI: soft, nontender, nondistended, + BS MS: no deformity or atrophy  Skin: warm and dry, no rash Neuro:  Strength and sensation are intact Psych: euthymic mood, full affect  Recent Labs: No results found for requested labs within last 8760 hours.    Lipid Panel No results found for: CHOL, HDL, LDLCALC, TRIG    Wt Readings from Last 3 Encounters:  05/12/18 155 lb 12.8 oz (70.7 kg)  02/21/18 154 lb 4 oz (70 kg)  07/01/17 157 lb (71.2 kg)       ASSESSMENT AND PLAN:  Mixed hyperlipidemia Continue statin Monitored by primary care  HYPERTENSION, BENIGN Running high today, Better at home Take extra amloidpine 5 mg as needed for high pressure  Secondary cardiomyopathy (HCC) Appears euvolemic on today's visit No further testing  Chronic fatigue/anxiety stressors at home, poor sleep continue her exercise   Anxiety significant stressors at home son going through divorce Affecting the grandchildren Hearing problems   Total encounter time more than 25 minutes  Greater than 50% was spent in counseling and coordination of care with the patient  Disposition:   F/U  12 months   Orders Placed This Encounter  Procedures  . EKG 12-Lead      Signed, Dossie Arbour, M.D., Ph.D. 05/12/2018  [facility] [place], [place] [phone]

## 2018-05-12 ENCOUNTER — Encounter: Payer: Self-pay | Admitting: Cardiovascular Disease

## 2018-05-12 ENCOUNTER — Ambulatory Visit (INDEPENDENT_AMBULATORY_CARE_PROVIDER_SITE_OTHER): Payer: Medicare Other | Admitting: Cardiovascular Disease

## 2018-05-12 VITALS — BP 176/70 | HR 55 | Ht 65.5 in | Wt 155.8 lb

## 2018-05-12 DIAGNOSIS — I499 Cardiac arrhythmia, unspecified: Secondary | ICD-10-CM

## 2018-05-12 DIAGNOSIS — I10 Essential (primary) hypertension: Secondary | ICD-10-CM

## 2018-05-12 DIAGNOSIS — R5382 Chronic fatigue, unspecified: Secondary | ICD-10-CM

## 2018-05-12 DIAGNOSIS — F419 Anxiety disorder, unspecified: Secondary | ICD-10-CM | POA: Diagnosis not present

## 2018-05-12 DIAGNOSIS — E782 Mixed hyperlipidemia: Secondary | ICD-10-CM | POA: Diagnosis not present

## 2018-05-12 DIAGNOSIS — I429 Cardiomyopathy, unspecified: Secondary | ICD-10-CM

## 2018-05-12 NOTE — Patient Instructions (Addendum)
Medication Instructions:   Please take extra amlodipine as needed for high blood pressure  Labwork:  No new labs needed  Testing/Procedures:  No further testing at this time   Follow-Up: It was a pleasure seeing you in the office today. Please call us if you have new issues that need to be addressed before your next appt.  [phone]  Your physician wants you to follow-up in: 12 months.  You will receive a reminder letter in the mail two months in advance. If you don't receive a letter, please call our office to schedule the follow-up appointment.  If you need a refill on your cardiac medications before your next appointment, please call your pharmacy.  For educational health videos Log in to : [email or web address] Or : [email or web address], password : [email or web address]

## 2018-05-13 ENCOUNTER — Ambulatory Visit
Admission: RE | Admit: 2018-05-13 | Discharge: 2018-05-13 | Disposition: A | Discharge status: Home / Self Care | Payer: Medicare Other | Source: Ambulatory Visit | Attending: Internal Medicine | Admitting: Internal Medicine

## 2018-05-13 DIAGNOSIS — Z1231 Encounter for screening mammogram for malignant neoplasm of breast: Secondary | ICD-10-CM | POA: Diagnosis not present

## 2018-07-15 ENCOUNTER — Other Ambulatory Visit: Payer: Self-pay | Admitting: Cardiovascular Disease

## 2018-09-11 ENCOUNTER — Other Ambulatory Visit: Payer: Self-pay | Admitting: Cardiovascular Disease

## 2018-10-29 ENCOUNTER — Other Ambulatory Visit: Payer: Self-pay | Admitting: Cardiovascular Disease

## 2019-05-21 ENCOUNTER — Other Ambulatory Visit: Payer: Self-pay | Admitting: Internal Medicine

## 2019-05-21 DIAGNOSIS — Z1231 Encounter for screening mammogram for malignant neoplasm of breast: Secondary | ICD-10-CM

## 2019-06-22 ENCOUNTER — Ambulatory Visit
Admission: RE | Admit: 2019-06-22 | Discharge: 2019-06-22 | Disposition: A | Discharge status: Home / Self Care | Payer: Medicare Other | Source: Ambulatory Visit | Attending: Internal Medicine | Admitting: Internal Medicine

## 2019-06-22 DIAGNOSIS — Z1231 Encounter for screening mammogram for malignant neoplasm of breast: Secondary | ICD-10-CM | POA: Diagnosis not present

## 2019-09-17 ENCOUNTER — Other Ambulatory Visit: Payer: Self-pay | Admitting: Cardiovascular Disease

## 2019-09-20 NOTE — Progress Notes (Signed)
Cardiology Office Note  Date:  09/21/2019   ID:  Robin Ortiz, DOB 1935/03/14, MRN [id number]  PCP:  Jaclyn Shaggy, MD   Chief Complaint  Patient presents with  . Follow-up    12 month follow up. Meds reviewed by the pt. verbally. "doing well."     HPI:  [name] is a pleasant 84 year-old woman with a history of  hypertension,  hyperlipidemia,  remote history of nonischemic cardiomyopathy with initial ejection fraction 20-30% in 2002 that has improved to 50-55% on echocardiogram in March 2009,  status post bilateral knee replacement  Anxiety/gets stressed, chronic fatigue who presents for routine follow up  Of her cardiomyopathy.  Blood pressure elevated today, 176 initial systolic Down to 497 on my recheck Does not check pressure at home  Hearing back to normal, may have been a virus Chronic fatigue  Stress at home, son divorced, affecting the whole family Takes care of grandchildren  Labs from Dr. Arlana Pouch 05/2019 Total chol 153, LDL 73  Echo 09/2015 - Left ventricle: The cavity size was normal. Wall thickness was   normal. Systolic function was mildly reduced. The estimated   ejection fraction was in the range of 45% to 50%. Diffuse   hypokinesis. The study is not technically sufficient to allow   evaluation of LV diastolic function. - Mitral valve: There was mild regurgitation. - Left atrium: The atrium was moderately dilated.   EKG personally reviewed by myself on todays visit shows normal sinus rhythm with rate 64 bpm, no significant ST or T-wave changes  Other past medical history She did a lifeline screening in the past that showed no significant carotid arterial disease, no AAA, no significant PAD. She is low risk for osteoporosis   Remote cardiac catheterization in 2002 showing mild RCA disease at the ostium.   PMH:   has a past medical history of Cardiomegaly, CHF (congestive heart failure) (HCC), Osteoporosis, Other and unspecified  hyperlipidemia, Secondary cardiomyopathy, unspecified, and Unspecified essential hypertension.  PSH:    Past Surgical History:  Procedure Laterality Date  . ROTATOR CUFF REPAIR     left shoulder  . ruptured tendon right foot Right    surgical repair  . TOTAL KNEE ARTHROPLASTY Bilateral     Current Outpatient Medications  Medication Sig Dispense Refill  . amLODipine (NORVASC) 5 MG tablet Take 1 tablet (5 mg total) by mouth daily. 90 tablet 2  . aspirin 81 MG tablet Take 81 mg by mouth daily.      . Calcium-Vitamin D 600-200 MG-UNIT per tablet Take 1 tablet by mouth daily.      . carvedilol (COREG) 12.5 MG tablet Take 1 tablet (12.5 mg total) by mouth 2 (two) times daily with a meal. 30 tablet 0  . celecoxib (CELEBREX) 200 MG capsule Take 200 mg by mouth daily as needed.     . furosemide (LASIX) 20 MG tablet Take 1 tablet (20 mg total) by mouth daily as needed. 30 tablet 0  . losartan (COZAAR) 100 MG tablet Take 1 tablet (100 mg total) by mouth daily. 90 tablet 3  . Misc Natural Products (OSTEO BI-FLEX ADV JOINT SHIELD PO) Take 1 tablet by mouth daily.      . Multiple Vitamin (MULTIVITAMIN) tablet Take 1 tablet by mouth daily.      . raloxifene (EVISTA) 60 MG tablet Take 60 mg by mouth daily.      . simvastatin (ZOCOR) 20 MG tablet Take 20 mg by mouth at bedtime.  No current facility-administered medications for this visit.     Allergies:   Patient has no known allergies.   Social History:  The patient  reports that she has never smoked. She has never used smokeless tobacco. She reports current alcohol use of about 2.0 - 3.0 standard drinks of alcohol per week. She reports that she does not use drugs.   Family History:   family history includes Breast cancer in her cousin; Breast cancer (age of onset: 29) in her maternal aunt; Breast cancer (age of onset: 59) in her maternal grandmother; Heart disease in her father.    Review of Systems: Review of Systems  Constitutional:  Positive for malaise/fatigue.  Respiratory: Positive for shortness of breath.   Cardiovascular: Negative.   Gastrointestinal: Negative.   Musculoskeletal: Negative.   Neurological: Negative.   Psychiatric/Behavioral: Negative.   All other systems reviewed and are negative.    PHYSICAL EXAM: VS:  BP (!) 178/79 (BP Location: Left Arm, Patient Position: Sitting, Cuff Size: Normal)   Pulse 64   Temp (!) 97.3 F (36.3 C)   Ht 5' 5.5" (1.664 m)   Wt 158 lb (71.7 kg)   BMI 25.89 kg/m  , BMI Body mass index is 25.89 kg/m.  No change from previous exam GEN: Well nourished, well developed, in no acute distress  HEENT: normal  Neck: no JVD, carotid bruits, or masses Cardiac: RRR; no murmurs, rubs, or gallops,no edema  Respiratory:  clear to auscultation bilaterally, normal work of breathing GI: soft, nontender, nondistended, + BS MS: no deformity or atrophy  Skin: warm and dry, no rash Neuro:  Strength and sensation are intact Psych: euthymic mood, full affect  Recent Labs: No results found for requested labs within last 8760 hours.    Lipid Panel No results found for: CHOL, HDL, LDLCALC, TRIG    Wt Readings from Last 3 Encounters:  09/21/19 158 lb (71.7 kg)  05/12/18 155 lb 12.8 oz (70.7 kg)  02/21/18 154 lb 4 oz (70 kg)       ASSESSMENT AND PLAN:  Mixed hyperlipidemia Continue statin Monitored by primary care Numbers at goal  HYPERTENSION, BENIGN Running high today, improved on recheck No medication changes made, recommend she continue to monitor her pressure  Secondary cardiomyopathy ([place]) Appears euvolemic on today's visit No further testing, no swelling Continue to have stress Recommended stress reduction techniques  Chronic fatigue/anxiety Needs walking program No doing water aerobics  Anxiety significant stressors at home Stable    Total encounter time more than 25 minutes  Greater than 50% was spent in counseling and coordination of care with the  patient  Disposition:   F/U  12 months   Orders Placed This Encounter  Procedures  . EKG 12-Lead     Signed, Esmond Plants, M.D., Ph.D. 09/21/2019  [place], [place]

## 2019-09-21 ENCOUNTER — Other Ambulatory Visit: Payer: Self-pay

## 2019-09-21 ENCOUNTER — Ambulatory Visit (INDEPENDENT_AMBULATORY_CARE_PROVIDER_SITE_OTHER): Payer: Medicare Other | Admitting: Cardiovascular Disease

## 2019-09-21 ENCOUNTER — Encounter: Payer: Self-pay | Admitting: Cardiovascular Disease

## 2019-09-21 VITALS — BP 140/80 | HR 64 | Temp 97.3°F | Ht 65.5 in | Wt 158.0 lb

## 2019-09-21 DIAGNOSIS — E782 Mixed hyperlipidemia: Secondary | ICD-10-CM | POA: Diagnosis not present

## 2019-09-21 DIAGNOSIS — R5382 Chronic fatigue, unspecified: Secondary | ICD-10-CM

## 2019-09-21 DIAGNOSIS — F419 Anxiety disorder, unspecified: Secondary | ICD-10-CM | POA: Diagnosis not present

## 2019-09-21 DIAGNOSIS — I10 Essential (primary) hypertension: Secondary | ICD-10-CM

## 2019-09-21 DIAGNOSIS — I499 Cardiac arrhythmia, unspecified: Secondary | ICD-10-CM

## 2019-09-21 DIAGNOSIS — I429 Cardiomyopathy, unspecified: Secondary | ICD-10-CM

## 2019-09-21 NOTE — Patient Instructions (Signed)

## 2019-10-01 ENCOUNTER — Other Ambulatory Visit: Payer: Self-pay | Admitting: Cardiovascular Disease

## 2019-10-23 ENCOUNTER — Other Ambulatory Visit: Payer: Self-pay

## 2019-10-23 MED ORDER — AMLODIPINE BESYLATE 5 MG PO TABS: ORAL_TABLET | ORAL | 2 refills | Status: DC

## 2019-11-06 ENCOUNTER — Other Ambulatory Visit: Payer: Self-pay | Admitting: Cardiovascular Disease

## 2020-02-09 ENCOUNTER — Other Ambulatory Visit: Payer: Self-pay | Admitting: Cardiovascular Disease

## 2020-06-01 ENCOUNTER — Other Ambulatory Visit: Payer: Self-pay | Admitting: Internal Medicine

## 2020-06-01 DIAGNOSIS — Z1231 Encounter for screening mammogram for malignant neoplasm of breast: Secondary | ICD-10-CM

## 2020-06-28 ENCOUNTER — Ambulatory Visit
Admission: RE | Admit: 2020-06-28 | Discharge: 2020-06-28 | Disposition: A | Discharge status: Home / Self Care | Payer: Medicare Other | Source: Ambulatory Visit | Attending: Internal Medicine | Admitting: Internal Medicine

## 2020-06-28 ENCOUNTER — Other Ambulatory Visit: Payer: Self-pay

## 2020-06-28 DIAGNOSIS — Z1231 Encounter for screening mammogram for malignant neoplasm of breast: Secondary | ICD-10-CM | POA: Diagnosis not present

## 2020-10-04 ENCOUNTER — Other Ambulatory Visit: Payer: Self-pay | Admitting: Cardiovascular Disease

## 2020-10-24 ENCOUNTER — Other Ambulatory Visit: Payer: Self-pay | Admitting: Cardiovascular Disease

## 2020-10-30 NOTE — Progress Notes (Unsigned)
Cardiology Office Note  Date:  10/31/2020   ID:  Robin Ortiz, DOB 25-Feb-1935, MRN [id number]  PCP:  Jaclyn Shaggy, MD   Chief Complaint  Patient presents with  . Follow-up    Annual follow up.Medications verbally reviewed with patient     HPI:  Robin Ortiz is a pleasant 86 year-old woman with a history of  hypertension,  hyperlipidemia,  remote history of nonischemic cardiomyopathy with initial ejection fraction 20-30% in 2002 that has improved to 50-55% on echocardiogram in March 2009,  status post bilateral knee replacement  Anxiety/gets stressed, chronic fatigue who presents for routine follow up  Of her cardiomyopathy.  Feels well, Does not check BP  Blood pressure has been relatively stable but does not check it on a regular basis Losartan in the PM Amlodipine in AM Coreg BID  Fatigue, chronic issue Nap in afternoon Takes care of grandchildren  Labs from Dr. Arlana Pouch 05/2019 Total chol 153, LDL 73 We have requested lab work from 2021  EKG personally reviewed by myself on todays visit shows normal sinus rhythm with rate 56 bpm, no significant ST or T-wave changes  Echo 09/2015 - Left ventricle: The cavity size was normal. Wall thickness was   normal. Systolic function was mildly reduced. The estimated   ejection fraction was in the range of 45% to 50%. Diffuse   hypokinesis. The study is not technically sufficient to allow   evaluation of LV diastolic function. - Mitral valve: There was mild regurgitation. - Left atrium: The atrium was moderately dilated.  Other past medical history She did a lifeline screening in the past that showed no significant carotid arterial disease, no AAA, no significant PAD. She is low risk for osteoporosis   Remote cardiac catheterization in 2002 showing mild RCA disease at the ostium.   PMH:   has a past medical history of Cardiomegaly, CHF (congestive heart failure) (HCC), Osteoporosis, Other and unspecified hyperlipidemia,  Secondary cardiomyopathy, unspecified, and Unspecified essential hypertension.  PSH:    Past Surgical History:  Procedure Laterality Date  . ROTATOR CUFF REPAIR     left shoulder  . ruptured tendon right foot Right    surgical repair  . TOTAL KNEE ARTHROPLASTY Bilateral     Current Outpatient Medications  Medication Sig Dispense Refill  . amLODipine (NORVASC) 5 MG tablet Take 1 tablet (5 mg total) by mouth daily. 90 tablet 3  . aspirin 81 MG tablet Take 81 mg by mouth daily.    . Calcium-Vitamin D 600-200 MG-UNIT per tablet Take 1 tablet by mouth daily.    . carvedilol (COREG) 12.5 MG tablet Take 1 tablet (12.5 mg total) by mouth 2 (two) times daily with a meal. 60 tablet 0  . celecoxib (CELEBREX) 200 MG capsule Take 200 mg by mouth daily as needed.     . furosemide (LASIX) 20 MG tablet Take 1 tablet (20 mg total) by mouth daily as needed. 30 tablet 0  . losartan (COZAAR) 100 MG tablet Take 1 tablet (100 mg total) by mouth daily. 90 tablet 3  . Misc Natural Products (OSTEO BI-FLEX ADV JOINT SHIELD PO) Take 1 tablet by mouth daily.    . Multiple Vitamin (MULTIVITAMIN) tablet Take 1 tablet by mouth daily.    . raloxifene (EVISTA) 60 MG tablet Take 60 mg by mouth daily.    . simvastatin (ZOCOR) 20 MG tablet Take 20 mg by mouth at bedtime.     No current facility-administered medications for this visit.  Allergies:   Patient has no known allergies.   Social History:  The patient  reports that she has never smoked. She has never used smokeless tobacco. She reports current alcohol use of about 2.0 - 3.0 standard drinks of alcohol per week. She reports that she does not use drugs.   Family History:   family history includes Breast cancer in her cousin; Breast cancer (age of onset: 27) in her maternal aunt; Breast cancer (age of onset: 23) in her maternal grandmother; Heart disease in her father.    Review of Systems: Review of Systems  Constitutional: Positive for malaise/fatigue.   HENT: Negative.   Respiratory: Negative.   Cardiovascular: Negative.   Gastrointestinal: Negative.   Musculoskeletal: Negative.   Neurological: Negative.   All other systems reviewed and are negative.   PHYSICAL EXAM: VS:  BP 138/78 (BP Location: Left Arm, Patient Position: Sitting, Cuff Size: Normal)   Pulse (!) 56   Ht 5' 5.5" (1.664 m)   Wt 155 lb (70.3 kg)   SpO2 98%   BMI 25.40 kg/m  , BMI Body mass index is 25.4 kg/m.  No change from previous exam GEN: Well nourished, well developed, in no acute distress  HEENT: normal  Neck: no JVD, carotid bruits, or masses Cardiac: RRR; no murmurs, rubs, or gallops,no edema  Respiratory:  clear to auscultation bilaterally, normal work of breathing GI: soft, nontender, nondistended, + BS MS: no deformity or atrophy  Skin: warm and dry, no rash Neuro:  Strength and sensation are intact Psych: euthymic mood, full affect  Recent Labs: No results found for requested labs within last 8760 hours.    Lipid Panel No results found for: CHOL, HDL, LDLCALC, TRIG    Wt Readings from Last 3 Encounters:  10/31/20 155 lb (70.3 kg)  09/21/19 158 lb (71.7 kg)  05/12/18 155 lb 12.8 oz (70.7 kg)       ASSESSMENT AND PLAN:  Mixed hyperlipidemia Continue statin Cholesterol is at goal on the current lipid regimen. No changes to the medications were made.  HYPERTENSION, BENIGN Blood pressure is well controlled on today's visit. No changes made to the medications.  Secondary cardiomyopathy (HCC) Euvolemic No changes to medications  Chronic fatigue/anxiety Suggested walking program Not doing water aerobics, Active, takes a nap in the afternoon to help keep her going  Anxiety significant stressors at home Stable , prior family stressors   Total encounter time more than 25 minutes  Greater than 50% was spent in counseling and coordination of care with the patient    Orders Placed This Encounter  Procedures  . EKG 12-Lead      Signed, Dossie Arbour, M.D., Ph.D. 10/31/2020  [facility] [place], [place] [phone]

## 2020-10-31 ENCOUNTER — Other Ambulatory Visit: Payer: Self-pay

## 2020-10-31 ENCOUNTER — Ambulatory Visit (INDEPENDENT_AMBULATORY_CARE_PROVIDER_SITE_OTHER): Payer: Medicare Other | Admitting: Cardiovascular Disease

## 2020-10-31 ENCOUNTER — Encounter: Payer: Self-pay | Admitting: Cardiovascular Disease

## 2020-10-31 VITALS — BP 138/78 | HR 56 | Ht 65.5 in | Wt 155.0 lb

## 2020-10-31 DIAGNOSIS — I10 Essential (primary) hypertension: Secondary | ICD-10-CM | POA: Diagnosis not present

## 2020-10-31 DIAGNOSIS — E782 Mixed hyperlipidemia: Secondary | ICD-10-CM

## 2020-10-31 DIAGNOSIS — F419 Anxiety disorder, unspecified: Secondary | ICD-10-CM | POA: Diagnosis not present

## 2020-10-31 DIAGNOSIS — R5382 Chronic fatigue, unspecified: Secondary | ICD-10-CM

## 2020-10-31 DIAGNOSIS — I429 Cardiomyopathy, unspecified: Secondary | ICD-10-CM | POA: Diagnosis not present

## 2020-10-31 NOTE — Patient Instructions (Signed)
Medication Instructions:  No changes  If you need a refill on your cardiac medications before your next appointment, please call your pharmacy.    Lab work: No new labs needed   If you have labs (blood work) drawn today and your tests are completely normal, you will receive your results only by: . MyChart Message (if you have MyChart) OR . A paper copy in the mail If you have any lab test that is abnormal or we need to change your treatment, we will call you to review the results.   Testing/Procedures: No new testing needed   Follow-Up: At [facility], you and your health needs are our priority.  As part of our continuing mission to provide you with exceptional heart care, we have created designated Provider Care Teams.  These Care Teams include your primary Cardiologist (physician) and Advanced Practice Providers (APPs -  Physician Assistants and Nurse Practitioners) who all work together to provide you with the care you need, when you need it.  . You will need a follow up appointment in 12 months  . Providers on your designated Care Team:   . Christopher Berge, NP . Ryan Dunn, PA-C . Jacquelyn Visser, PA-C  Any Other Special Instructions Will Be Listed Below (If Applicable).  COVID-19 Vaccine Information can be found at: [email or web address] For questions related to vaccine distribution or appointments, please email [email or web address] or call [phone].     

## 2020-11-08 ENCOUNTER — Other Ambulatory Visit: Payer: Self-pay | Admitting: Cardiovascular Disease

## 2020-11-25 ENCOUNTER — Encounter: Payer: Self-pay | Admitting: Cardiovascular Disease

## 2021-03-22 ENCOUNTER — Encounter: Payer: Self-pay | Admitting: Cardiovascular Disease

## 2021-05-15 ENCOUNTER — Other Ambulatory Visit: Payer: Self-pay | Admitting: Cardiovascular Disease

## 2021-05-29 ENCOUNTER — Other Ambulatory Visit: Payer: Self-pay | Admitting: Internal Medicine

## 2021-05-29 DIAGNOSIS — Z1231 Encounter for screening mammogram for malignant neoplasm of breast: Secondary | ICD-10-CM

## 2021-06-28 ENCOUNTER — Encounter: Payer: Self-pay | Admitting: Physical Therapy

## 2021-06-28 ENCOUNTER — Ambulatory Visit: Payer: Medicare Other | Attending: Orthopaedic Surgery | Admitting: Physical Therapy

## 2021-06-28 DIAGNOSIS — M25572 Pain in left ankle and joints of left foot: Secondary | ICD-10-CM | POA: Diagnosis not present

## 2021-06-28 DIAGNOSIS — G8929 Other chronic pain: Secondary | ICD-10-CM

## 2021-06-28 NOTE — Therapy (Signed)
[place] [facility] MEDICINE [address] [place], [address] Phone: [phone]   Fax:  [phone]  Physical Therapy Evaluation  Patient Details  Name: Robin Ortiz MRN: [id number] Date of Birth: [birth year] No data recorded  Encounter Date: 06/28/2021   PT End of Session - 06/28/21 1509     Visit Number 1    Number of Visits 17    Date for PT Re-Evaluation 08/09/21    Authorization - Visit Number 1    Progress Note Due on Visit 10    PT Start Time 1343    PT Stop Time 1430    PT Time Calculation (min) 47 min    Activity Tolerance Patient tolerated treatment well    Behavior During Therapy [facility] for tasks assessed/performed             Past Medical History:  Diagnosis Date   Cardiomegaly    CHF (congestive heart failure) (HCC)    Osteoporosis    Other and unspecified hyperlipidemia    Secondary cardiomyopathy, unspecified    Unspecified essential hypertension     Past Surgical History:  Procedure Laterality Date   ROTATOR CUFF REPAIR     left shoulder   ruptured tendon right foot Right    surgical repair   TOTAL KNEE ARTHROPLASTY Bilateral     There were no vitals filed for this visit.    Subjective Assessment - 06/28/21 1438     Subjective Robin Ortiz is a 86 y.o. female with Ortiz/o L medial foot/ankle pain. She reports her pain began insideously in July 2022 without MOI. She presents today wearing a L ankle brace with midfoot arch support and was previously in a CAM boot for two weeks. She reports having been seen at [facility] and has minimal pain and swelling since wearing brace. She reports her pain as 0/10 at best and 2/10 pain at worst. She reports her ankle pain limits her ability to walk longer durations, on uneven surfaces, and negotiate stairs.  Pt denies any unexplained weight fluctuation, saddle paresthesia, loss of bowel/bladder function, or unrelenting night pain at this time. Pt has a  PMH of CHF, osteoporosis, HTN, anxiety, BTKA, R posterior tibial tendon repair.    Pertinent History Robin Ortiz is a 86 y.o. female with Ortiz/o L medial foot/ankle pain. She reports her pain began insideously in July 2022 without MOI. She presents today wearing a L ankle brace with midfoot arch support and was previously in a CAM boot for two weeks. She reports having been seen at [facility] and has minimal pain and swelling since wearing brace. She reports her pain as 0/10 at best and 2/10 pain at worst. She reports her ankle pain limits her ability to walk longer durations, on uneven surfaces, and negotiate stairs.  Pt denies any unexplained weight fluctuation, saddle paresthesia, loss of bowel/bladder function, or unrelenting night pain at this time. Pt has a PMH of CHF, osteoporosis, HTN, anxiety, BTKA, R posterior tibial tendon repair.    Limitations Walking;Standing;House hold activities    How long can you sit comfortably? n/a    How long can you stand comfortably? na    How long can you walk comfortably? >30 min    Diagnostic tests xray    Patient Stated Goals Patient states that she wants to prevent surgery for her foot.    Pain Onset More than a month ago  Knee ROM WFL Bilat  Knee MMT L/R   Flex 4/4 Ext 4/4  Ankle ROM L/R (deg)  Dorsiflexion 12/15 Plantarflexion 25/35 Inversion  20/25 Eversion 32/20  Ankle MMT L/R Dorsiflexion 4-/4 Plantarflexion 2/3 Inversion 4-/4 *L painful Eversion 4/4  Great Toe ROM  Ext Flexion  *Limited each motion < 10 deg bilat  Special tests Heel raise unable to successfully complete on L ankle  Anterior drawer (-) Varus stress (-) Resisted inversion: (+) Posterior tibial edema (SP100)    Objective measurements completed on examination: See above findings.   Observations:  Gait:decreased stance time L LE, increased L foot pronation, hindfoot valgus  Sensation: LE grossly intact bilaterally; reports of  numbness/tingling on bottom of feet  Palpation: TTP to post tib insertion with increased edema following WB activities  Test & Measures  Single leg stance <5 sec  : 0.90 m/s Stair negotiation reciprocal with single UE support; R handrail   Ther-Ex PT reviewed the following HEP with patient with patient able to demonstrate a set of the following with min cuing for correction needed. PT educated patient on parameters of therex (how/when to inc/decrease intensity, frequency, rep/set range, stretch hold time, and purpose of therex) with verbalized understanding.  Towel scrunches Calf stretch with towel Dorsiflexion AROM Arch doming  Ankle Inversion  2-3/week 2 x 20 reps        PT Education - 06/28/21 1650     Education provided Yes    Education Details Patient was educated on diagnosis, anatomy and pathology involved, prognosis, role of PT, and was given an HEP, demonstrating exercise with proper form following verbal and tactile cues, and was given a paper hand out to continue exercise at home. Pt was educated on and agreed to plan of care.    Person(s) Educated Patient    Methods Explanation;Demonstration    Comprehension Verbalized understanding;Returned demonstration              PT Short Term Goals - 06/28/21 1626       PT SHORT TERM GOAL #1   Title Pt will demonstrate independence with HEP to improve L ankle function for increased ability to participate with ADLs    Baseline Hep given    Time 3    Period Weeks    Status New    Target Date 07/19/21               PT Long Term Goals - 06/28/21 1627       PT LONG TERM GOAL #1   Title Patient will increase FOTO score to 66 to demonstrate predicted increase in functional mobility to complete ADLs    Baseline 9/28: 57    Time 8    Period Weeks    Status New      PT LONG TERM GOAL #2   Title Pt will increase gait speed in to 1.2 m/s to demonstrate appropriate speed for limited community  ambulation    Baseline 0.90 m/s    Time 8    Period Weeks    Status New      PT LONG TERM GOAL #3   Title Pt will improve single leg stance to 15 sec or greater in order to demonstrate increased ankle stability, increased balance, and decrease likelihood of falling    Baseline 9/28 <5 sec    Time 8    Period Weeks    Status New  Plan - 06/28/21 1510     Clinical Impression Statement Pt is a 86 y.o female presenting today with L medial ankle/foot pain. Pt s/s consistent with L posterior tibial tendinopathy, specifically demonstrating pain and weakness with L ankle inversion, edema tibilialis posterior insertion, and increased navicular drop. Patient presents with  the following impairments: decreased ankle/LE strength, activity tolerance, AROM, PROM,abnormal gait, and pain. Activity limitations with walking, standing, and stair negotitation prohibiting participation in completing ADLs household chores and recreational activities. Patient will benefit from skilled PT to address these impairments and return to PLOF.    Personal Factors and Comorbidities Age;Sex;Comorbidity 2;Time since onset of injury/illness/exacerbation    Examination-Activity Limitations Locomotion Level;Stairs;Stand;Squat    Examination-Participation Restrictions Cleaning;Community Activity;Shop;Yard Work    Conservation officer, historic buildings Stable/Uncomplicated    Clinical Decision Making Low    Rehab Potential Good    PT Frequency 2x / week    PT Duration 6 weeks    PT Treatment/Interventions Ultrasound;Neuromuscular re-education;Gait training;Cryotherapy;Patient/family education;Electrical Stimulation;Taping;Therapeutic activities;Iontophoresis 4mg /ml Dexamethasone;Moist Heat;Therapeutic exercise;Manual techniques;Passive range of motion;ADLs/Self Care Home Management;Balance training;Dry needling;Energy conservation;Functional mobility training;Stair training;Traction;Orthotic Fit/Training     PT Next Visit Plan 5xSTS; outcome measures; HEP update    PT Home Exercise Plan update sets reps frequency    Consulted and Agree with Plan of Care Patient             Patient will benefit from skilled therapeutic intervention in order to improve the following deficits and impairments:  Difficulty walking, Pain, Improper body mechanics, Decreased strength, Decreased balance, Abnormal gait, Decreased endurance, Decreased mobility, Hypomobility, Impaired sensation, Increased edema, Decreased range of motion, Impaired flexibility, Increased fascial restricitons, Decreased activity tolerance  Visit Diagnosis: Chronic pain of left ankle     Problem List Patient Active Problem List   Diagnosis Date Noted   Anxiety 09/10/2016   Chronic cough 09/09/2015   Fatigue 09/09/2015   Secondary cardiomyopathy (HCC) 12/14/2010   Hyperlipidemia 05/03/2010   HYPERTENSION, BENIGN 05/03/2010   CARDIOMEGALY 05/03/2010    07/03/2010 DPT Hilda Lias, SPT  Romilda Joy, PT 06/29/2021, 2:18 PM  [place] [facility] [address], [address], [place] Phone: [phone]   Fax:  [phone]  Name: Robin Ortiz [phone] MRN: [id number] Date of Birth: 05/15/1935

## 2021-06-29 ENCOUNTER — Ambulatory Visit
Admission: RE | Admit: 2021-06-29 | Discharge: 2021-06-29 | Disposition: A | Discharge status: Home / Self Care | Payer: Medicare Other | Source: Ambulatory Visit | Attending: Internal Medicine | Admitting: Internal Medicine

## 2021-06-29 ENCOUNTER — Other Ambulatory Visit: Payer: Self-pay

## 2021-06-29 DIAGNOSIS — Z1231 Encounter for screening mammogram for malignant neoplasm of breast: Secondary | ICD-10-CM | POA: Diagnosis present

## 2021-06-30 ENCOUNTER — Encounter: Payer: Self-pay | Admitting: Physical Therapy

## 2021-06-30 ENCOUNTER — Ambulatory Visit: Payer: Medicare Other | Admitting: Physical Therapy

## 2021-06-30 DIAGNOSIS — M25572 Pain in left ankle and joints of left foot: Secondary | ICD-10-CM | POA: Diagnosis not present

## 2021-06-30 DIAGNOSIS — G8929 Other chronic pain: Secondary | ICD-10-CM

## 2021-06-30 NOTE — Therapy (Signed)
[place] [facility] [address], [place], [address] Phone: [phone]   Fax:  [phone]  Physical Therapy Treatment  Patient Details  Name: Robin Ortiz MRN: [id number] Date of Birth: 1935/07/02 No data recorded  Encounter Date: 06/30/2021   PT End of Session - 06/30/21 0851     Visit Number 2    Number of Visits 17    Date for PT Re-Evaluation 08/09/21    Authorization - Visit Number 2    Progress Note Due on Visit 10    PT Start Time 0848    PT Stop Time 0930    PT Time Calculation (min) 42 min    Activity Tolerance Patient tolerated treatment well    Behavior During Therapy [facility] for tasks assessed/performed             Past Medical History:  Diagnosis Date   Cardiomegaly    CHF (congestive heart failure) (HCC)    Osteoporosis    Other and unspecified hyperlipidemia    Secondary cardiomyopathy, unspecified    Unspecified essential hypertension     Past Surgical History:  Procedure Laterality Date   ROTATOR CUFF REPAIR     left shoulder   ruptured tendon right foot Right    surgical repair   TOTAL KNEE ARTHROPLASTY Bilateral     There were no vitals filed for this visit.   Subjective Assessment - 06/30/21 0851     Subjective Pt reports active participation in HEP and no pain in L ankle this visit.    Pertinent History Robin Ortiz is a 86 y.o. female with c/o L medial foot/ankle pain. She reports her pain began insideously in July 2022 without MOI. She presents today wearing a L ankle brace with midfoot arch support and was previously in a CAM boot for two weeks. She reports having been seen at [facility] and has minimal pain and swelling since wearing brace. She reports her pain as 0/10 at best and 2/10 pain at worst. She reports her ankle pain limits her ability to walk longer durations, on uneven surfaces, and negotiate stairs.  Pt denies any unexplained weight fluctuation, saddle  paresthesia, loss of bowel/bladder function, or unrelenting night pain at this time. Pt has a PMH of CHF, osteoporosis, HTN, anxiety, BTKA, R posterior tibial tendon repair.    Limitations Walking;Standing;House hold activities    How long can you sit comfortably? n/a    How long can you stand comfortably? na    How long can you walk comfortably? >30 min    Diagnostic tests xray    Patient Stated Goals Patient states that she wants to prevent surgery for her foot.            Therex   Seated Ankle Inversion 3 x 10 reps GTB   Standing Calf raises on 2x4  3 x 10 reps with B UE support  FWD stepping on to airecx with single UE support 2 x 10 reps   FWD/BWD step over on airex BLE 2 x 10 reps single UE   Standing Hip ABD 2 x 10 reps on BLE with cueing to keep toe straight   Gastroc&Soleus stretch 3 x 30 sec L LE  Exercise Performance   5xSTS: 19.44sec                           PT Education - 06/30/21 0945     Education  Details therex form/technique    Person(s) Educated Patient    Methods Explanation;Demonstration    Comprehension Verbalized understanding;Returned demonstration              PT Short Term Goals - 06/28/21 1626       PT SHORT TERM GOAL #1   Title Pt will demonstrate independence with HEP to improve L ankle function for increased ability to participate with ADLs    Baseline Hep given    Time 3    Period Weeks    Status New    Target Date 07/19/21               PT Long Term Goals - 06/28/21 1627       PT LONG TERM GOAL #1   Title Patient will increase FOTO score to 66 to demonstrate predicted increase in functional mobility to complete ADLs    Baseline 9/28: 57    Time 8    Period Weeks    Status New      PT LONG TERM GOAL #2   Title Pt will increase gait speed in to 1.2 m/s to demonstrate appropriate speed for limited community ambulation    Baseline 0.90 m/s    Time 8    Period Weeks    Status New       PT LONG TERM GOAL #3   Title Pt will improve single leg stance to 15 sec or greater in order to demonstrate increased ankle stability, increased balance, and decrease likelihood of falling    Baseline 9/28 <5 sec    Time 8    Period Weeks    Status New                   Plan - 06/30/21 8828     Clinical Impression Statement PT initiated LE strengthening, dynamic balance, and ankle mobility this session. Pt tolerated session well with no reports of increased pain in L ankle and no increased edema with weight bearing activity.  Patient required multimodal cueing but demonstrated decent carry over. Patient will continue to benefit from skilled therapy for exercise performance to ensure proper technique for safe and effective progression.    Personal Factors and Comorbidities Age;Sex;Comorbidity 2;Time since onset of injury/illness/exacerbation    Examination-Activity Limitations Locomotion Level;Stairs;Stand;Squat    Examination-Participation Restrictions Cleaning;Community Activity;Shop;Yard Work    Conservation officer, historic buildings Stable/Uncomplicated    Clinical Decision Making Low    Rehab Potential Good    PT Frequency 2x / week    PT Duration 6 weeks    PT Treatment/Interventions Ultrasound;Neuromuscular re-education;Gait training;Cryotherapy;Patient/family education;Electrical Stimulation;Taping;Therapeutic activities;Iontophoresis 4mg /ml Dexamethasone;Moist Heat;Therapeutic exercise;Manual techniques;Passive range of motion;ADLs/Self Care Home Management;Balance training;Dry needling;Energy conservation;Functional mobility training;Stair training;Traction;Orthotic Fit/Training    PT Next Visit Plan HEP update    PT Home Exercise Plan ankle inversion, calf stretch, dorsiflexion    Consulted and Agree with Plan of Care Patient             Patient will benefit from skilled therapeutic intervention in order to improve the following deficits and impairments:  Difficulty  walking, Pain, Improper body mechanics, Decreased strength, Decreased balance, Abnormal gait, Decreased endurance, Decreased mobility, Hypomobility, Impaired sensation, Increased edema, Decreased range of motion, Impaired flexibility, Increased fascial restricitons, Decreased activity tolerance  Visit Diagnosis: Chronic pain of left ankle     Problem List Patient Active Problem List   Diagnosis Date Noted   Anxiety 09/10/2016   Chronic cough 09/09/2015   Fatigue 09/09/2015  Secondary cardiomyopathy (HCC) 12/14/2010   Hyperlipidemia 05/03/2010   HYPERTENSION, BENIGN 05/03/2010   CARDIOMEGALY 05/03/2010     Hilda Lias DPT Romilda Joy, SPT  Hilda Lias, PT 06/30/2021, 9:52 AM  [place] [facility] PHYSICAL AND SPORTS MEDICINE [address] [place], [address] Phone: [phone]   Fax:  [phone]  Name: Robin Ortiz MRN: [id number] Date of Birth: 05-Jul-1935

## 2021-07-03 ENCOUNTER — Encounter: Payer: Self-pay | Admitting: Physical Therapy

## 2021-07-03 ENCOUNTER — Ambulatory Visit: Payer: Medicare Other | Attending: Orthopaedic Surgery | Admitting: Physical Therapy

## 2021-07-03 DIAGNOSIS — G8929 Other chronic pain: Secondary | ICD-10-CM | POA: Diagnosis present

## 2021-07-03 DIAGNOSIS — M25572 Pain in left ankle and joints of left foot: Secondary | ICD-10-CM | POA: Diagnosis not present

## 2021-07-03 NOTE — Therapy (Signed)
[place] [facility] [address], [place], [address] Phone: [phone]   Fax:  [phone]  Physical Therapy Treatment  Patient Details  Name: Robin Ortiz MRN: [id number] Date of Birth: [birth year] No data recorded  Encounter Date: 07/03/2021     Past Medical History:  Diagnosis Date   Cardiomegaly    CHF (congestive heart failure) (HCC)    Osteoporosis    Other and unspecified hyperlipidemia    Secondary cardiomyopathy, unspecified    Unspecified essential hypertension     Past Surgical History:  Procedure Laterality Date   ROTATOR CUFF REPAIR     left shoulder   ruptured tendon right foot Right    surgical repair   TOTAL KNEE ARTHROPLASTY Bilateral     There were no vitals filed for this visit.       Therex  Nu Step L2 x 5 min for gentle LE strengthening   Step ups onto 4in step with opposite LE hip flexion to treadmill railing 2 x10 reps; BLE with single UE support   Standing Calf raises on airex 3 x 10 reps with B UE support   Standing Hip ABD YTB 2 x 10 reps on BLE with cueing to keep toe straight   Seated Ankle Inversion 3 x 10 reps GTB anchored at foot level  Gastroc&Soleus stretch on slant board 3 x 30 sec BLE                              PT Short Term Goals - 06/28/21 1626       PT SHORT TERM GOAL #1   Title Pt will demonstrate independence with HEP to improve L ankle function for increased ability to participate with ADLs    Baseline Hep given    Time 3    Period Weeks    Status New    Target Date 07/19/21               PT Long Term Goals - 06/28/21 1627       PT LONG TERM GOAL #1   Title Patient will increase FOTO score to 66 to demonstrate predicted increase in functional mobility to complete ADLs    Baseline 9/28: 57    Time 8    Period Weeks    Status New      PT LONG TERM GOAL #2   Title Pt will increase gait speed in  to 1.2 m/s to demonstrate appropriate speed for limited community ambulation    Baseline 0.90 m/s    Time 8    Period Weeks    Status New      PT LONG TERM GOAL #3   Title Pt will improve single leg stance to 15 sec or greater in order to demonstrate increased ankle stability, increased balance, and decrease likelihood of falling    Baseline 9/28 <5 sec    Time 8    Period Weeks    Status New                     Patient will benefit from skilled therapeutic intervention in order to improve the following deficits and impairments:  Difficulty walking, Pain, Improper body mechanics, Decreased strength, Decreased balance, Abnormal gait, Decreased endurance, Decreased mobility, Hypomobility, Impaired sensation, Increased edema, Decreased range of motion, Impaired flexibility, Increased fascial restricitons, Decreased activity tolerance  Visit Diagnosis: Chronic pain  of left ankle     Problem List Patient Active Problem List   Diagnosis Date Noted   Anxiety 09/10/2016   Chronic cough 09/09/2015   Fatigue 09/09/2015   Secondary cardiomyopathy (HCC) 12/14/2010   Hyperlipidemia 05/03/2010   HYPERTENSION, BENIGN 05/03/2010   CARDIOMEGALY 05/03/2010    Hilda Lias DPT Romilda Joy, SPT  Hilda Lias, PT 07/06/2021, 9:18 AM  [place] [facility] PHYSICAL AND SPORTS MEDICINE [address], [place], [address] Phone: [phone]   Fax:  [phone]  Name: Samani C Ortiz MRN: [id number] Date of Birth: 07-21-1935

## 2021-07-05 ENCOUNTER — Encounter: Payer: Self-pay | Admitting: Physical Therapy

## 2021-07-05 ENCOUNTER — Ambulatory Visit: Payer: Medicare Other | Admitting: Physical Therapy

## 2021-07-05 DIAGNOSIS — G8929 Other chronic pain: Secondary | ICD-10-CM

## 2021-07-05 DIAGNOSIS — M25572 Pain in left ankle and joints of left foot: Secondary | ICD-10-CM | POA: Diagnosis not present

## 2021-07-05 NOTE — Therapy (Signed)
[place] [facility] [address], [place], [address] Phone: [phone]   Fax:  [phone]  Physical Therapy Treatment  Patient Details  Name: Robin Ortiz MRN: [id number] Date of Birth: 10/08/1934 No data recorded  Encounter Date: 07/05/2021   PT End of Session - 07/05/21 1454     Visit Number 4    Number of Visits 17    Date for PT Re-Evaluation 08/09/21    Authorization Type 4    Authorization Time Period of 10    Authorization - Visit Number 4    Progress Note Due on Visit 10    PT Start Time 1440    PT Stop Time 1518    PT Time Calculation (min) 38 min    Activity Tolerance Patient tolerated treatment well    Behavior During Therapy WFL for tasks assessed/performed             Past Medical History:  Diagnosis Date   Cardiomegaly    CHF (congestive heart failure) (HCC)    Osteoporosis    Other and unspecified hyperlipidemia    Secondary cardiomyopathy, unspecified    Unspecified essential hypertension     Past Surgical History:  Procedure Laterality Date   ROTATOR CUFF REPAIR     left shoulder   ruptured tendon right foot Right    surgical repair   TOTAL KNEE ARTHROPLASTY Bilateral     There were no vitals filed for this visit.   Subjective Assessment - 07/05/21 1444     Subjective Pt reports minimal foot/ankle pain currently. Rpeorts she wears the boot all the time, in fear that she could rupture a tendon on the L foot like se did on the R foot. Reports she finds PT hard to do with her mask on    Pertinent History Robin Ortiz is a 86 y.o. female with c/o L medial foot/ankle pain. She reports her pain began insideously in July 2022 without MOI. She presents today wearing a L ankle brace with midfoot arch support and was previously in a CAM boot for two weeks. She reports having been seen at [facility] and has minimal pain and swelling since wearing brace. She reports her pain as  0/10 at best and 2/10 pain at worst. She reports her ankle pain limits her ability to walk longer durations, on uneven surfaces, and negotiate stairs.  Pt denies any unexplained weight fluctuation, saddle paresthesia, loss of bowel/bladder function, or unrelenting night pain at this time. Pt has a PMH of CHF, osteoporosis, HTN, anxiety, BTKA, R posterior tibial tendon repair.    Limitations Walking;Standing;House hold activities    How long can you sit comfortably? n/a    How long can you stand comfortably? na    How long can you walk comfortably? >30 min    Diagnostic tests xray    Patient Stated Goals Patient states that she wants to prevent surgery for her foot.    Pain Onset More than a month ago              Therex   Nu Step L2 x 5 min for gentle LE strengthening    Step ups onto 6in step 2x 10 with TC at lateral knee to prevent knee valgus + midfoot pronation with decent carry over  Squat 3x 10 RTB at knees and ankles to aid in LE alignment, good carry over, fairly consistent cuing needed for heel contact- difficult d/t gastroc tension  L  lateral (mini) lunge onto foam 2x 8 with min cuing to prevent ankle eversion with step out, good carry over for "knee over toes" with wt on lateral foot and neutral knee alignment with lowering phase   Gastroc&Soleus stretch on slant board 3 x30 sec BLE                          PT Education - 07/05/21 1453     Education provided Yes    Education Details therex form/technique    Person(s) Educated Patient    Methods Explanation;Demonstration;Verbal cues    Comprehension Returned demonstration;Verbalized understanding;Tactile cues required              PT Short Term Goals - 06/28/21 1626       PT SHORT TERM GOAL #1   Title Pt will demonstrate independence with HEP to improve L ankle function for increased ability to participate with ADLs    Baseline Hep given    Time 3    Period Weeks    Status New     Target Date 07/19/21               PT Long Term Goals - 06/28/21 1627       PT LONG TERM GOAL #1   Title Patient will increase FOTO score to 66 to demonstrate predicted increase in functional mobility to complete ADLs    Baseline 9/28: 57    Time 8    Period Weeks    Status New      PT LONG TERM GOAL #2   Title Pt will increase gait speed in to 1.2 m/s to demonstrate appropriate speed for limited community ambulation    Baseline 0.90 m/s    Time 8    Period Weeks    Status New      PT LONG TERM GOAL #3   Title Pt will improve single leg stance to 15 sec or greater in order to demonstrate increased ankle stability, increased balance, and decrease likelihood of falling    Baseline 9/28 <5 sec    Time 8    Period Weeks    Status New                   Plan - 07/06/21 6301     Clinical Impression Statement PT continued therex progression for increased ankle stability, proprioception, and LE alignment with success. Patient requires fairly consistent cuing to prevent midfoot pronation with intrinsic activiation, and compensatory knee valgus + hip IR with weight bearing, but is able to work toward correcting this. PT encouraged patient to continue weaning from brace as she has been (taking it off after dinner for the night), but continuing to focus on this alignment with patient responding well to cuing for "spreading the floor" with her feet. PT will continue progression as able.    Personal Factors and Comorbidities Age;Sex;Comorbidity 2;Time since onset of injury/illness/exacerbation    Examination-Activity Limitations Locomotion Level;Stairs;Stand;Squat    Examination-Participation Restrictions Cleaning;Community Activity;Shop;Yard Work    Conservation officer, historic buildings Evolving/Moderate complexity    Clinical Decision Making Moderate    Rehab Potential Good    Clinical Impairments Affecting Rehab Potential no known impairments affecting rehab potential     PT Frequency 2x / week    PT Duration 6 weeks    PT Treatment/Interventions Ultrasound;Neuromuscular re-education;Gait training;Cryotherapy;Patient/family education;Electrical Stimulation;Taping;Therapeutic activities;Iontophoresis 4mg /ml Dexamethasone;Moist Heat;Therapeutic exercise;Manual techniques;Passive range of motion;ADLs/Self Care Home Management;Balance training;Dry needling;Energy conservation;Functional  mobility training;Stair training;Traction;Orthotic Fit/Training    PT Next Visit Plan HEP updated    PT Home Exercise Plan ankle inversion, calf stretch, dorsiflexion, hip abd, SLS, heel raises    Consulted and Agree with Plan of Care Patient             Patient will benefit from skilled therapeutic intervention in order to improve the following deficits and impairments:  Difficulty walking, Pain, Improper body mechanics, Decreased strength, Decreased balance, Abnormal gait, Decreased endurance, Decreased mobility, Hypomobility, Impaired sensation, Increased edema, Decreased range of motion, Impaired flexibility, Increased fascial restricitons, Decreased activity tolerance  Visit Diagnosis: Chronic pain of left ankle     Problem List Patient Active Problem List   Diagnosis Date Noted   Anxiety 09/10/2016   Chronic cough 09/09/2015   Fatigue 09/09/2015   Secondary cardiomyopathy (HCC) 12/14/2010   Hyperlipidemia 05/03/2010   HYPERTENSION, BENIGN 05/03/2010   CARDIOMEGALY 05/03/2010   Hilda Lias DPT Hilda Lias, PT 07/06/2021, 9:55 AM  [place] [facility] [address] [place], [address] Phone: [phone]   Fax:  [phone]  Name: Robin Ortiz MRN: [id number] Date of Birth: Aug 09, 1935

## 2021-07-10 ENCOUNTER — Ambulatory Visit: Payer: Medicare Other | Admitting: Physical Therapy

## 2021-07-10 DIAGNOSIS — G8929 Other chronic pain: Secondary | ICD-10-CM

## 2021-07-10 DIAGNOSIS — M25572 Pain in left ankle and joints of left foot: Secondary | ICD-10-CM | POA: Diagnosis not present

## 2021-07-10 NOTE — Therapy (Signed)
[facility] [address], [place], [address] Phone: [phone]   Fax:  [phone]  Physical Therapy Treatment  Patient Details  Name: Robin Ortiz MRN: [id number] Date of Birth: 08-Aug-1935 No data recorded  Encounter Date: 07/10/2021   PT End of Session - 07/10/21 1525     Visit Number 5    Number of Visits 17    Date for PT Re-Evaluation 08/09/21    Authorization - Visit Number 5    Progress Note Due on Visit 10    PT Start Time 1430    PT Stop Time 1515    PT Time Calculation (min) 45 min    Activity Tolerance Patient tolerated treatment well    Behavior During Therapy Robin Ortiz for tasks assessed/performed             Past Medical History:  Diagnosis Date   Cardiomegaly    CHF (congestive heart failure) (HCC)    Osteoporosis    Other and unspecified hyperlipidemia    Secondary cardiomyopathy, unspecified    Unspecified essential hypertension     Past Surgical History:  Procedure Laterality Date   ROTATOR CUFF REPAIR     left shoulder   ruptured tendon right foot Right    surgical repair   TOTAL KNEE ARTHROPLASTY Bilateral     There were no vitals filed for this visit.   Subjective Assessment - 07/10/21 1433     Subjective Pt reports minimal foot/ankle pain currently. She continues to report active participation with HEP.    Pertinent History Robin Ortiz is a 86 y.o. female with c/o L medial foot/ankle pain. She reports her pain began insideously in July 2022 without MOI. She presents today wearing a L ankle brace with midfoot arch support and was previously in a CAM boot for two weeks. She reports having been seen at [facility] and has minimal pain and swelling since wearing brace. She reports her pain as 0/10 at best and 2/10 pain at worst. She reports her ankle pain limits her ability to walk longer durations, on uneven surfaces, and negotiate stairs.  Pt denies any unexplained  weight fluctuation, saddle paresthesia, loss of bowel/bladder function, or unrelenting night pain at this time. Pt has a PMH of CHF, osteoporosis, HTN, anxiety, BTKA, R posterior tibial tendon repair.    Limitations Walking;Standing;House hold activities    How long can you sit comfortably? n/a    How long can you stand comfortably? na    How long can you walk comfortably? >30 min    Diagnostic tests xray    Patient Stated Goals Patient states that she wants to prevent surgery for her foot.            Therex   Nu Step L2 x 5 min for gentle LE strengthening   Sit to stand (green chair) 3 x 10 RTB at knees to aid in hip/knee alignment with decent carry over  Standing Calf raises on airex 2 x 10 reps with B UE support 1 x 10 reps without   Lateral step up/downs 6 in step 2 x10 reps with emphasis on eccentric control and LE alignment and control   L Foot doming with golf ball as tactile cue x 20 reps   Gastroc&Soleus stretch on slant board 3 x30 sec BLE  PT Education - 07/10/21 1526     Education Details therex form/technique    Person(s) Educated Patient    Methods Explanation;Demonstration    Comprehension Verbalized understanding;Returned demonstration              PT Short Term Goals - 06/28/21 1626       PT SHORT TERM GOAL #1   Title Pt will demonstrate independence with HEP to improve L ankle function for increased ability to participate with ADLs    Baseline Hep given    Time 3    Period Weeks    Status New    Target Date 07/19/21               PT Long Term Goals - 06/28/21 1627       PT LONG TERM GOAL #1   Title Patient will increase FOTO score to 66 to demonstrate predicted increase in functional mobility to complete ADLs    Baseline 9/28: 57    Time 8    Period Weeks    Status New      PT LONG TERM GOAL #2   Title Pt will increase gait speed in to 1.2 m/s to demonstrate appropriate  speed for limited community ambulation    Baseline 0.90 m/s    Time 8    Period Weeks    Status New      PT LONG TERM GOAL #3   Title Pt will improve single leg stance to 15 sec or greater in order to demonstrate increased ankle stability, increased balance, and decrease likelihood of falling    Baseline 9/28 <5 sec    Time 8    Period Weeks    Status New                   Plan - 07/10/21 1517     Clinical Impression Statement PT continued therex progression for increased ankle stability, proprioception, and LE alignment with success. Patient requires consistent cuing to prevent midfoot pronation and knee valgus with weight bearing activity, but is able to correct with multimodal cueing with some carry over. Patient will continue to benefit from skilled therapy for exercise performance to ensure proper technique for safe and effective progression.    Personal Factors and Comorbidities Age;Sex;Comorbidity 2;Time since onset of injury/illness/exacerbation    Examination-Activity Limitations Locomotion Level;Stairs;Stand;Squat    Examination-Participation Restrictions Cleaning;Community Activity;Shop;Yard Work    Conservation officer, historic buildings Evolving/Moderate complexity    Clinical Decision Making Moderate    Rehab Potential Good    PT Frequency 2x / week    PT Duration 6 weeks    PT Treatment/Interventions Ultrasound;Neuromuscular re-education;Gait training;Cryotherapy;Patient/family education;Electrical Stimulation;Taping;Therapeutic activities;Iontophoresis 4mg /ml Dexamethasone;Moist Heat;Therapeutic exercise;Manual techniques;Passive range of motion;ADLs/Self Care Home Management;Balance training;Dry needling;Energy conservation;Functional mobility training;Stair training;Traction;Orthotic Fit/Training    PT Next Visit Plan LE strengthening, alignment, proprioception, calf stretch HEP    PT Home Exercise Plan ankle inversion, calf stretch, dorsiflexion, hip abd, SLS, heel  raises    Consulted and Agree with Plan of Care Patient             Patient will benefit from skilled therapeutic intervention in order to improve the following deficits and impairments:  Difficulty walking, Pain, Improper body mechanics, Decreased strength, Decreased balance, Abnormal gait, Decreased endurance, Decreased mobility, Hypomobility, Impaired sensation, Increased edema, Decreased range of motion, Impaired flexibility, Increased fascial restricitons, Decreased activity tolerance  Visit Diagnosis: Chronic pain of left ankle     Problem List Patient Active  Problem List   Diagnosis Date Noted   Anxiety 09/10/2016   Chronic cough 09/09/2015   Fatigue 09/09/2015   Secondary cardiomyopathy (HCC) 12/14/2010   Hyperlipidemia 05/03/2010   HYPERTENSION, BENIGN 05/03/2010   CARDIOMEGALY 05/03/2010     Hilda Lias DPT Romilda Joy, SPT  Romilda Joy, Student-PT 07/10/2021, 3:28 PM  [place] PHYS[facility] SPORTS MEDICINE [address] [place], [address] Phone: [phone]   Fax:  [phone]  Name: Robin [facility]iz MRN: [id number] Date of Birth: 07/17/1935

## 2021-07-12 ENCOUNTER — Encounter: Payer: Self-pay | Admitting: Physical Therapy

## 2021-07-12 ENCOUNTER — Ambulatory Visit: Payer: Medicare Other | Admitting: Physical Therapy

## 2021-07-12 DIAGNOSIS — G8929 Other chronic pain: Secondary | ICD-10-CM

## 2021-07-12 DIAGNOSIS — M25572 Pain in left ankle and joints of left foot: Secondary | ICD-10-CM

## 2021-07-12 NOTE — Therapy (Signed)
[place] [facility] [address] [place], [address] Phone: [phone]   Fax:  [phone]  Physical Therapy Treatment  Patient Details  Name: Robin [facility] MRN: [id number] Date of Birth: 1935/06/02 No data recorded  Encounter Date: 07/12/2021   PT End of Session - 07/12/21 1434     Visit Number 6    Number of Visits 17    Date for PT Re-Evaluation 08/09/21    Authorization Type 5    Authorization Time Period of 10    Authorization - Visit Number 5    Progress Note Due on Visit 10    PT Start Time 1430    PT Stop Time 1510    PT Time Calculation (min) 40 min    Equipment Utilized During Treatment Gait belt    Activity Tolerance Patient tolerated treatment well    Behavior During Therapy WFL for tasks assessed/performed             Past Medical History:  Diagnosis Date   Cardiomegaly    CHF (congestive heart failure) (HCC)    Osteoporosis    Other and unspecified hyperlipidemia    Secondary cardiomyopathy, unspecified    Unspecified essential hypertension     Past Surgical History:  Procedure Laterality Date   ROTATOR CUFF REPAIR     left shoulder   ruptured tendon right foot Right    surgical repair   TOTAL KNEE ARTHROPLASTY Bilateral     There were no vitals filed for this visit.   Subjective Assessment - 07/12/21 1432     Subjective Pt continues to reports minimal foot/ankle pain. She continues to report active participation with HEP and having some soreness following visit.    Pertinent History Robin [facility] is a 86 y.o. female with c/o L medial foot/ankle pain. She reports her pain began insideously in July 2022 without MOI. She presents today wearing a L ankle brace with midfoot arch support and was previously in a CAM boot for two weeks. She reports having been seen at [facility] and has minimal pain and swelling since wearing brace. She reports her pain as 0/10 at best and 2/10  pain at worst. She reports her ankle pain limits her ability to walk longer durations, on uneven surfaces, and negotiate stairs.  Pt denies any unexplained weight fluctuation, saddle paresthesia, loss of bowel/bladder function, or unrelenting night pain at this time. Pt has a PMH of CHF, osteoporosis, HTN, anxiety, BTKA, R posterior tibial tendon repair.    Limitations Walking;Standing;House hold activities    How long can you stand comfortably? na    How long can you walk comfortably? >30 min    Diagnostic tests xray    Patient Stated Goals Patient states that she wants to prevent surgery for her foot.             Therex   Recumbent Bike L2 x 5 min for gentle LE strengthening    Lateral Step ups/downs onto 6in step 2x 10 with TC at lateral knee to prevent knee valgus + midfoot pronation with decent carry over  Omega Leg Press 3 x 10 reps #20  Omega leg press with calf raises #15 2 x 15 reps  Step ups onto 4in step with opposite LE hip flexion to treadmill railing 2 x10 reps; BLE with single UE support  FWD/LAT Lunges on bosu ball L LE 2 x 10 reps   Gastroc&Soleus stretch on wall 3 x30 sec  BLE with handout given           PT Education - 07/12/21 1516     Education provided Yes    Education Details therex form/technique    Person(s) Educated Patient    Methods Demonstration;Explanation    Comprehension Verbalized understanding;Returned demonstration              PT Short Term Goals - 06/28/21 1626       PT SHORT TERM GOAL #1   Title Pt will demonstrate independence with HEP to improve L ankle function for increased ability to participate with ADLs    Baseline Hep given    Time 3    Period Weeks    Status New    Target Date 07/19/21               PT Long Term Goals - 06/28/21 1627       PT LONG TERM GOAL #1   Title Patient will increase FOTO score to 66 to demonstrate predicted increase in functional mobility to complete ADLs    Baseline 9/28:  57    Time 8    Period Weeks    Status New      PT LONG TERM GOAL #2   Title Pt will increase gait speed in to 1.2 m/s to demonstrate appropriate speed for limited community ambulation    Baseline 0.90 m/s    Time 8    Period Weeks    Status New      PT LONG TERM GOAL #3   Title Pt will improve single leg stance to 15 sec or greater in order to demonstrate increased ankle stability, increased balance, and decrease likelihood of falling    Baseline 9/28 <5 sec    Time 8    Period Weeks    Status New                   Plan - 07/12/21 1518     Clinical Impression Statement Pt tolerated session well with no reports of increased pain following therex. PT continued therex progression for increased ankle stability, proprioception, eccentric control and alignment. Patient requires consistent cuing to prevent midfoot pronation and knee valgus with weight bearing activity, but is able to correct with multimodal cueing with some carry over. Patient will continue to benefit from skilled therapy for exercise performance to ensure proper technique for safe and effective progression.    Personal Factors and Comorbidities Age;Sex;Comorbidity 2;Time since onset of injury/illness/exacerbation    Examination-Activity Limitations Locomotion Level;Stairs;Stand;Squat    Examination-Participation Restrictions Cleaning;Community Activity;Shop;Yard Work    Conservation officer, historic buildings Evolving/Moderate complexity    Clinical Decision Making Moderate    Rehab Potential Good    PT Frequency 2x / week    PT Duration 6 weeks    PT Treatment/Interventions Ultrasound;Neuromuscular re-education;Gait training;Cryotherapy;Patient/family education;Electrical Stimulation;Taping;Therapeutic activities;Iontophoresis 4mg /ml Dexamethasone;Moist Heat;Therapeutic exercise;Manual techniques;Passive range of motion;ADLs/Self Care Home Management;Balance training;Dry needling;Energy conservation;Functional  mobility training;Stair training;Traction;Orthotic Fit/Training    PT Next Visit Plan LE strengthening, alignment, proprioception, calf stretch HEP    PT Home Exercise Plan ankle inversion, calf stretch, dorsiflexion, hip abd, SLS, heel raises             Patient will benefit from skilled therapeutic intervention in order to improve the following deficits and impairments:  Difficulty walking, Pain, Improper body mechanics, Decreased strength, Decreased balance, Abnormal gait, Decreased endurance, Decreased mobility, Hypomobility, Impaired sensation, Increased edema, Decreased range of motion, Impaired flexibility, Increased fascial  restricitons, Decreased activity tolerance  Visit Diagnosis: Chronic pain of left ankle     Problem List Patient Active Problem List   Diagnosis Date Noted   Anxiety 09/10/2016   Chronic cough 09/09/2015   Fatigue 09/09/2015   Secondary cardiomyopathy (HCC) 12/14/2010   Hyperlipidemia 05/03/2010   HYPERTENSION, BENIGN 05/03/2010   CARDIOMEGALY 05/03/2010    Hilda Lias DPT Romilda Joy, SPT  Hilda Lias, PT 07/13/2021, 10:30 AM  [facility] PHYSICAL AND SPORTS MEDICINE [address], [place], [address] Phone: [phone]   Fax:  [phone]  Name: Alphonsine C [facility]iz MRN: [id number] Date of Birth: [birth year]

## 2021-07-17 ENCOUNTER — Ambulatory Visit: Payer: Medicare Other | Admitting: Physical Therapy

## 2021-07-17 ENCOUNTER — Encounter: Payer: Self-pay | Admitting: Physical Therapy

## 2021-07-17 DIAGNOSIS — G8929 Other chronic pain: Secondary | ICD-10-CM

## 2021-07-17 DIAGNOSIS — M25572 Pain in left ankle and joints of left foot: Secondary | ICD-10-CM | POA: Diagnosis not present

## 2021-07-17 NOTE — Therapy (Signed)
[place] [facility] [address], [place], [address] Phone: [phone]   Fax:  [phone]  Physical Therapy Treatment  Patient Details  Name: Robin Ortiz MRN: [id number] Date of Birth: 1935-06-17 No data recorded  Encounter Date: 07/17/2021   PT End of Session - 07/17/21 1442     Visit Number 7    Number of Visits 17    Date for PT Re-Evaluation 08/09/21    Authorization - Visit Number 7    Progress Note Due on Visit 10    PT Start Time 1431    PT Stop Time 1511    PT Time Calculation (min) 40 min    Equipment Utilized During Treatment Gait belt    Activity Tolerance Patient tolerated treatment well    Behavior During Therapy WFL for tasks assessed/performed             Past Medical History:  Diagnosis Date   Cardiomegaly    CHF (congestive heart failure) (HCC)    Osteoporosis    Other and unspecified hyperlipidemia    Secondary cardiomyopathy, unspecified    Unspecified essential hypertension     Past Surgical History:  Procedure Laterality Date   ROTATOR CUFF REPAIR     left shoulder   ruptured tendon right foot Right    surgical repair   TOTAL KNEE ARTHROPLASTY Bilateral     There were no vitals filed for this visit.   Subjective Assessment - 07/17/21 1439     Subjective Pt continues to reports minimal foot/ankle pain. She continues to report active participation with HEP and notices increased ability to negotiate steps and get out of a chair.    Pertinent History Robin Ortiz is a 86 y.o. female with c/o L medial foot/ankle pain. She reports her pain began insideously in July 2022 without MOI. She presents today wearing a L ankle brace with midfoot arch support and was previously in a CAM boot for two weeks. She reports having been seen at [facility] and has minimal pain and swelling since wearing brace. She reports her pain as 0/10 at best and 2/10 pain at worst. She reports her  ankle pain limits her ability to walk longer durations, on uneven surfaces, and negotiate stairs.  Pt denies any unexplained weight fluctuation, saddle paresthesia, loss of bowel/bladder function, or unrelenting night pain at this time. Pt has a PMH of CHF, osteoporosis, HTN, anxiety, BTKA, R posterior tibial tendon repair.    Limitations Walking;Standing;House hold activities    How long can you sit comfortably? n/a    How long can you stand comfortably? na    How long can you walk comfortably? >30 min    Diagnostic tests xray    Patient Stated Goals Patient states that she wants to prevent surgery for her foot.             Therex   Nu Step L2 x 5 min Seat 7 LE only for gentle LE strengthening   Matrix Hip ABD 2 x 10 reps #25   FWD Eccentric step down 4" step  with increased bilat knee valgus and greater UE support 2 x 10 reps   Lateral Lunges onto large dynodisc 2 x 10 reps with Vcs for initial set up and LE alignment to prevent knee valgus and midfoot pronation  Standing Gastroc stretch 3 x 30 sec  PT Education - 07/17/21 1532     Education provided Yes    Education Details therex form/technique    Person(s) Educated Patient    Methods Explanation;Demonstration    Comprehension Verbalized understanding;Returned demonstration              PT Short Term Goals - 06/28/21 1626       PT SHORT TERM GOAL #1   Title Pt will demonstrate independence with HEP to improve L ankle function for increased ability to participate with ADLs    Baseline Hep given    Time 3    Period Weeks    Status New    Target Date 07/19/21               PT Long Term Goals - 06/28/21 1627       PT LONG TERM GOAL #1   Title Patient will increase FOTO score to 66 to demonstrate predicted increase in functional mobility to complete ADLs    Baseline 9/28: 57    Time 8    Period Weeks    Status New      PT LONG TERM GOAL #2   Title Pt will  increase gait speed in to 1.2 m/s to demonstrate appropriate speed for limited community ambulation    Baseline 0.90 m/s    Time 8    Period Weeks    Status New      PT LONG TERM GOAL #3   Title Pt will improve single leg stance to 15 sec or greater in order to demonstrate increased ankle stability, increased balance, and decrease likelihood of falling    Baseline 9/28 <5 sec    Time 8    Period Weeks    Status New                   Plan - 07/17/21 1533     Clinical Impression Statement Pt tolerated session well with no reports of increased pain following therapeutic exercise. PT continued LE strengthening  and ankle stability with emphasis on proprioception and eccentric control with steps and dynamic surfaces. Pt continues to demonstrate knee valgus and midfoot pronation with WB. Patient will continue to benefit from skilled PT for exercise performance to ensure proper form and technique.    Personal Factors and Comorbidities Age;Sex;Comorbidity 2;Time since onset of injury/illness/exacerbation    Examination-Activity Limitations Locomotion Level;Stairs;Stand;Squat    Examination-Participation Restrictions Cleaning;Community Activity;Shop;Yard Work    Conservation officer, historic buildings Evolving/Moderate complexity    Clinical Decision Making Moderate    Rehab Potential Good    Clinical Impairments Affecting Rehab Potential no known impairments affecting rehab potential    PT Frequency 2x / week    PT Duration 6 weeks    PT Treatment/Interventions Ultrasound;Neuromuscular re-education;Gait training;Cryotherapy;Patient/family education;Electrical Stimulation;Taping;Therapeutic activities;Iontophoresis 4mg /ml Dexamethasone;Moist Heat;Therapeutic exercise;Manual techniques;Passive range of motion;ADLs/Self Care Home Management;Balance training;Dry needling;Energy conservation;Functional mobility training;Stair training;Traction;Orthotic Fit/Training    PT Next Visit Plan LE  strengthening, alignment, proprioception, calf stretch HEP    PT Home Exercise Plan ankle inversion, calf stretch, dorsiflexion, hip abd, SLS, heel raises    Consulted and Agree with Plan of Care Patient             Patient will benefit from skilled therapeutic intervention in order to improve the following deficits and impairments:  Difficulty walking, Pain, Improper body mechanics, Decreased strength, Decreased balance, Abnormal gait, Decreased endurance, Decreased mobility, Hypomobility, Impaired sensation, Increased edema, Decreased range of motion, Impaired flexibility, Increased fascial  restricitons, Decreased activity tolerance  Visit Diagnosis: Chronic pain of left ankle     Problem List Patient Active Problem List   Diagnosis Date Noted   Anxiety 09/10/2016   Chronic cough 09/09/2015   Fatigue 09/09/2015   Secondary cardiomyopathy (HCC) 12/14/2010   Hyperlipidemia 05/03/2010   HYPERTENSION, BENIGN 05/03/2010   CARDIOMEGALY 05/03/2010     Hilda Lias DPT Romilda Joy, SPT  Hilda Lias, PT 07/18/2021, 4:36 PM  [facility] PH[facility] SPORTS MEDICINE [address], [place], [address] Phone: [phone]   Fax:  [phone]  Name: Robin Ortiz MRN: [id number] Date of Birth: 11/16/1934

## 2021-07-19 ENCOUNTER — Encounter: Payer: Self-pay | Admitting: Physical Therapy

## 2021-07-19 ENCOUNTER — Ambulatory Visit: Payer: Medicare Other | Admitting: Physical Therapy

## 2021-07-19 DIAGNOSIS — M25572 Pain in left ankle and joints of left foot: Secondary | ICD-10-CM

## 2021-07-19 DIAGNOSIS — G8929 Other chronic pain: Secondary | ICD-10-CM

## 2021-07-19 NOTE — Therapy (Signed)
[place] [facility] [address], [place], [address] Phone: [phone]   Fax:  [phone]  Physical Therapy Treatment  Patient Details  Name: Robin Ortiz MRN: [id number] Date of Birth: [birth year] No data recorded  Encounter Date: 07/19/2021   PT End of Session - 07/19/21 1440     Visit Number 8    Number of Visits 17    Date for PT Re-Evaluation 08/09/21    Authorization - Visit Number 8    Progress Note Due on Visit 10    PT Start Time 1433    PT Stop Time 1513    PT Time Calculation (min) 40 min    Equipment Utilized During Treatment Gait belt    Activity Tolerance Patient tolerated treatment well    Behavior During Therapy WFL for tasks assessed/performed             Past Medical History:  Diagnosis Date   Cardiomegaly    CHF (congestive heart failure) (HCC)    Osteoporosis    Other and unspecified hyperlipidemia    Secondary cardiomyopathy, unspecified    Unspecified essential hypertension     Past Surgical History:  Procedure Laterality Date   ROTATOR CUFF REPAIR     left shoulder   ruptured tendon right foot Right    surgical repair   TOTAL KNEE ARTHROPLASTY Bilateral     There were no vitals filed for this visit.   Subjective Assessment - 07/19/21 1434     Subjective Pt reports minimal foot/ankle pain on occasion. She does report increased stiffness in L hip. Pt continues to wear ankle brace when WB.    Pertinent History Robin Ortiz is a 86 y.o. female with c/o L medial foot/ankle pain. She reports her pain began insideously in July 2022 without MOI. She presents today wearing a L ankle brace with midfoot arch support and was previously in a CAM boot for two weeks. She reports having been seen at [facility] and has minimal pain and swelling since wearing brace. She reports her pain as 0/10 at best and 2/10 pain at worst. She reports her ankle pain limits her ability to walk  longer durations, on uneven surfaces, and negotiate stairs.  Pt denies any unexplained weight fluctuation, saddle paresthesia, loss of bowel/bladder function, or unrelenting night pain at this time. Pt has a PMH of CHF, osteoporosis, HTN, anxiety, BTKA, R posterior tibial tendon repair.    Limitations Walking;Standing;House hold activities    How long can you sit comfortably? n/a    How long can you stand comfortably? na    How long can you walk comfortably? >30 min    Diagnostic tests xray    Patient Stated Goals Patient states that she wants to prevent surgery for her foot.               Therex    Nu Step L2 x 5 min Seat 7 LE only for gentle LE strengthening   Seated Foot Inversion 3 x 10 reps GTB with tactile cues to prevent hip internal rotation   Omega Leg Press 1 x 10 reps #35   FWD Lunges onto large dynodisc 2 x 8 reps with Vcs for initial set up and LE alignment to prevent knee valgus and midfoot pronation; increased ache above post.tib. Insertion when leading with R LE    Lat Eccentric step down onto small airex 4" step  with increased bilat knee valgus and greater UE  support 1 x 10 reps                       PT Education - 07/19/21 1610     Education Details pt educated to wean use of L ankle brace while at home.    Person(s) Educated Patient    Methods Explanation    Comprehension Verbalized understanding              PT Short Term Goals - 06/28/21 1626       PT SHORT TERM GOAL #1   Title Pt will demonstrate independence with HEP to improve L ankle function for increased ability to participate with ADLs    Baseline Hep given    Time 3    Period Weeks    Status New    Target Date 07/19/21               PT Long Term Goals - 06/28/21 1627       PT LONG TERM GOAL #1   Title Patient will increase FOTO score to 66 to demonstrate predicted increase in functional mobility to complete ADLs    Baseline 9/28: 57    Time 8    Period  Weeks    Status New      PT LONG TERM GOAL #2   Title Pt will increase gait speed in to 1.2 m/s to demonstrate appropriate speed for limited community ambulation    Baseline 0.90 m/s    Time 8    Period Weeks    Status New      PT LONG TERM GOAL #3   Title Pt will improve single leg stance to 15 sec or greater in order to demonstrate increased ankle stability, increased balance, and decrease likelihood of falling    Baseline 9/28 <5 sec    Time 8    Period Weeks    Status New                   Plan - 07/19/21 1600     Clinical Impression Statement PT continued LE strengthening and ankle stability exercises this session focusing on proprioception and eccentric control on uneven surfaces. Pt tolerated session fair with reports of a mild ache to L post. tib. insertion and dull pain in R hip. Pt continues to demostrate decreased LE strength, activity tolerance, and balance. Continue PT POC to treat impairments and progress as able.    Personal Factors and Comorbidities Age;Sex;Comorbidity 2;Time since onset of injury/illness/exacerbation    Examination-Activity Limitations Locomotion Level;Stairs;Stand;Squat    Examination-Participation Restrictions Cleaning;Community Activity;Shop;Yard Work    Conservation officer, historic buildings Evolving/Moderate complexity    Clinical Decision Making Moderate    Rehab Potential Good    Clinical Impairments Affecting Rehab Potential no known impairments affecting rehab potential    PT Frequency 2x / week    PT Duration 6 weeks    PT Treatment/Interventions Ultrasound;Neuromuscular re-education;Gait training;Cryotherapy;Patient/family education;Electrical Stimulation;Taping;Therapeutic activities;Iontophoresis 4mg /ml Dexamethasone;Moist Heat;Therapeutic exercise;Manual techniques;Passive range of motion;ADLs/Self Care Home Management;Balance training;Dry needling;Energy conservation;Functional mobility training;Stair  training;Traction;Orthotic Fit/Training    PT Next Visit Plan hip/knee/ankle stability, alignment, proprioception    PT Home Exercise Plan ankle inversion, calf stretch, dorsiflexion, hip abd, SLS, heel raises    Consulted and Agree with Plan of Care Patient             Patient will benefit from skilled therapeutic intervention in order to improve the following deficits and impairments:  Difficulty walking,  Pain, Improper body mechanics, Decreased strength, Decreased balance, Abnormal gait, Decreased endurance, Decreased mobility, Hypomobility, Impaired sensation, Increased edema, Decreased range of motion, Impaired flexibility, Increased fascial restricitons, Decreased activity tolerance  Visit Diagnosis: Chronic pain of left ankle     Problem List Patient Active Problem List   Diagnosis Date Noted   Anxiety 09/10/2016   Chronic cough 09/09/2015   Fatigue 09/09/2015   Secondary cardiomyopathy (HCC) 12/14/2010   Hyperlipidemia 05/03/2010   HYPERTENSION, BENIGN 05/03/2010   CARDIOMEGALY 05/03/2010     Hilda Lias DPT Romilda Joy, SPT  Hilda Lias, PT 07/20/2021, 2:31 PM  [place] [facility] [address] [place], [address] Phone: [phone]   Fax:  [phone]  Name: Robin Ortiz MRN: [id number] Date of Birth: Nov 15, 1934

## 2021-07-24 ENCOUNTER — Encounter: Payer: Self-pay | Admitting: Physical Therapy

## 2021-07-24 ENCOUNTER — Ambulatory Visit: Payer: Medicare Other | Admitting: Physical Therapy

## 2021-07-24 DIAGNOSIS — G8929 Other chronic pain: Secondary | ICD-10-CM

## 2021-07-24 DIAGNOSIS — M25572 Pain in left ankle and joints of left foot: Secondary | ICD-10-CM | POA: Diagnosis not present

## 2021-07-24 NOTE — Therapy (Signed)
[place] [facility] [address] [place], [address] Phone: [phone]   Fax:  [phone]  Physical Therapy Treatment  Patient Details  Name: Robin Ortiz MRN: [id number] Date of Birth: 1935-08-27 No data recorded  Encounter Date: 07/24/2021   PT End of Session - 07/24/21 1512     Visit Number 9    Number of Visits 17    Date for PT Re-Evaluation 08/09/21    Authorization - Visit Number 9    Progress Note Due on Visit 10    PT Start Time 1430    PT Stop Time 1511    PT Time Calculation (min) 41 min    Equipment Utilized During Treatment Gait belt    Activity Tolerance Patient tolerated treatment well    Behavior During Therapy WFL for tasks assessed/performed             Past Medical History:  Diagnosis Date   Cardiomegaly    CHF (congestive heart failure) (HCC)    Osteoporosis    Other and unspecified hyperlipidemia    Secondary cardiomyopathy, unspecified    Unspecified essential hypertension     Past Surgical History:  Procedure Laterality Date   ROTATOR CUFF REPAIR     left shoulder   ruptured tendon right foot Right    surgical repair   TOTAL KNEE ARTHROPLASTY Bilateral     There were no vitals filed for this visit.   Subjective Assessment - 07/24/21 1434     Subjective Pt reports slight ache in her L foot/ankle. She reports having other aches in pain in her knees and hips.    Pertinent History Robin Ortiz is a 86 y.o. female with c/o L medial foot/ankle pain. She reports her pain began insideously in July 2022 without MOI. She presents today wearing a L ankle brace with midfoot arch support and was previously in a CAM boot for two weeks. She reports having been seen at [facility] and has minimal pain and swelling since wearing brace. She reports her pain as 0/10 at best and 2/10 pain at worst. She reports her ankle pain limits her ability to walk longer durations, on uneven  surfaces, and negotiate stairs.  Pt denies any unexplained weight fluctuation, saddle paresthesia, loss of bowel/bladder function, or unrelenting night pain at this time. Pt has a PMH of CHF, osteoporosis, HTN, anxiety, BTKA, R posterior tibial tendon repair.    Limitations Walking;Standing;House hold activities    How long can you sit comfortably? n/a    How long can you stand comfortably? na    How long can you walk comfortably? >30 min    Diagnostic tests xray    Patient Stated Goals Patient states that she wants to prevent surgery for her foot.             Therex    Nu Step L2 x 5 min Seat 7 LE only for gentle LE strengthening    Mini Squat with swiss ball on wall 2 x 10 reps with YTB above knee as cueing to prevent knee valgus/bilat hip IR/bilat midfoot pronation  Standing Foot Inversion 3 x 10 reps GTB with tactile cues to prevent hip internal rotation compensation  Single Leg Step ups onto bosu ball 2 x 10 reps on L LE with single UE support   Tandem Walking on airex x 3 trials with decrease balance and CGA-MinA to recovery  PT Education - 07/24/21 1433     Education provided Yes    Education Details therex form/technique    Person(s) Educated Patient    Methods Explanation;Demonstration    Comprehension Verbalized understanding;Returned demonstration              PT Short Term Goals - 06/28/21 1626       PT SHORT TERM GOAL #1   Title Pt will demonstrate independence with HEP to improve L ankle function for increased ability to participate with ADLs    Baseline Hep given    Time 3    Period Weeks    Status New    Target Date 07/19/21               PT Long Term Goals - 06/28/21 1627       PT LONG TERM GOAL #1   Title Patient will increase FOTO score to 66 to demonstrate predicted increase in functional mobility to complete ADLs    Baseline 9/28: 57    Time 8    Period Weeks    Status New       PT LONG TERM GOAL #2   Title Pt will increase gait speed in to 1.2 m/s to demonstrate appropriate speed for limited community ambulation    Baseline 0.90 m/s    Time 8    Period Weeks    Status New      PT LONG TERM GOAL #3   Title Pt will improve single leg stance to 15 sec or greater in order to demonstrate increased ankle stability, increased balance, and decrease likelihood of falling    Baseline 9/28 <5 sec    Time 8    Period Weeks    Status New                   Plan - 07/25/21 0254     Clinical Impression Statement PT continued LE strengthening and ankle stability exercises this session focusing on proprioception, balance, and eccentric control on uneven surfaces. Pt tolerated session well with no reports of increased foot/ankle or hip pain.. Pt continues to demostrate decreased LE strength, activity tolerance, and balance. Continue PT POC to treat impairments and progress as able.    Personal Factors and Comorbidities Age;Sex;Comorbidity 2;Time since onset of injury/illness/exacerbation    Examination-Activity Limitations Locomotion Level;Stairs;Stand;Squat    Examination-Participation Restrictions Cleaning;Community Activity;Shop;Yard Work    Conservation officer, historic buildings Evolving/Moderate complexity    Clinical Decision Making Moderate    Rehab Potential Good    PT Frequency 2x / week    PT Duration 6 weeks    PT Treatment/Interventions Ultrasound;Neuromuscular re-education;Gait training;Cryotherapy;Patient/family education;Electrical Stimulation;Taping;Therapeutic activities;Iontophoresis 4mg /ml Dexamethasone;Moist Heat;Therapeutic exercise;Manual techniques;Passive range of motion;ADLs/Self Care Home Management;Balance training;Dry needling;Energy conservation;Functional mobility training;Stair training;Traction;Orthotic Fit/Training    PT Next Visit Plan hip/knee/ankle stability, alignment, proprioception    PT Home Exercise Plan ankle inversion, calf  stretch, dorsiflexion, hip abd, SLS, heel raises    Consulted and Agree with Plan of Care Patient             Patient will benefit from skilled therapeutic intervention in order to improve the following deficits and impairments:  Difficulty walking, Pain, Improper body mechanics, Decreased strength, Decreased balance, Abnormal gait, Decreased endurance, Decreased mobility, Hypomobility, Impaired sensation, Increased edema, Decreased range of motion, Impaired flexibility, Increased fascial restricitons, Decreased activity tolerance  Visit Diagnosis: Chronic pain of left ankle     Problem List Patient Active Problem List  Diagnosis Date Noted   Anxiety 09/10/2016   Chronic cough 09/09/2015   Fatigue 09/09/2015   Secondary cardiomyopathy (HCC) 12/14/2010   Hyperlipidemia 05/03/2010   HYPERTENSION, BENIGN 05/03/2010   CARDIOMEGALY 05/03/2010     Hilda Lias DPT Romilda Joy, SPT  Hilda Lias, PT 07/25/2021, 12:06 PM  [place] [facility] PHYSICAL AND SPORTS MEDICINE [address] [place], [address] Phone: [phone]   Fax:  [phone]  Name: Robin Ortiz MRN: [id number] Date of Birth: [birth year]

## 2021-07-26 ENCOUNTER — Encounter: Payer: Self-pay | Admitting: Physical Therapy

## 2021-07-26 ENCOUNTER — Ambulatory Visit: Payer: Medicare Other | Admitting: Physical Therapy

## 2021-07-26 DIAGNOSIS — M25572 Pain in left ankle and joints of left foot: Secondary | ICD-10-CM | POA: Diagnosis not present

## 2021-07-26 NOTE — Therapy (Signed)
[facility] [address], [place], [address] Phone: [phone]   Fax:  [phone]  Physical Therapy Treatment/Progress Note Reporting Period 06/28/21 - 07/26/21  Patient Details  Name: Robin Ortiz MRN: [id number] Date of Birth: 08/20/1935 No data recorded  Encounter Date: 07/26/2021   PT End of Session - 07/26/21 1439     Visit Number 10    Number of Visits 17    Date for PT Re-Evaluation 08/09/21    Authorization - Visit Number 10    Progress Note Due on Visit 10    PT Start Time 1433    PT Stop Time 1511    PT Time Calculation (min) 38 min    Equipment Utilized During Treatment Gait belt    Activity Tolerance Patient tolerated treatment well    Behavior During Therapy WFL for tasks assessed/performed             Past Medical History:  Diagnosis Date   Cardiomegaly    CHF (congestive heart failure) (HCC)    Osteoporosis    Other and unspecified hyperlipidemia    Secondary cardiomyopathy, unspecified    Unspecified essential hypertension     Past Surgical History:  Procedure Laterality Date   ROTATOR CUFF REPAIR     left shoulder   ruptured tendon right foot Right    surgical repair   TOTAL KNEE ARTHROPLASTY Bilateral     There were no vitals filed for this visit.   Subjective Assessment - 07/26/21 1436     Subjective Patient reports she is still having some L ant hip pain, that is her primary concern today. Reports she has still been weaning out of the brace starting in the afternoon, leaving it off for the rest of the day.    Pertinent History Robin Ortiz is a 86 y.o. female with c/o L medial foot/ankle pain. She reports her pain began insideously in July 2022 without MOI. She presents today wearing a L ankle brace with midfoot arch support and was previously in a CAM boot for two weeks. She reports having been seen at [facility] and has minimal pain and swelling since  wearing brace. She reports her pain as 0/10 at best and 2/10 pain at worst. She reports her ankle pain limits her ability to walk longer durations, on uneven surfaces, and negotiate stairs.  Pt denies any unexplained weight fluctuation, saddle paresthesia, loss of bowel/bladder function, or unrelenting night pain at this time. Pt has a PMH of CHF, osteoporosis, HTN, anxiety, BTKA, R posterior tibial tendon repair.    Limitations Walking;Standing;House hold activities    How long can you sit comfortably? n/a    How long can you stand comfortably? na    How long can you walk comfortably? >30 min    Diagnostic tests xray    Patient Stated Goals Patient states that she wants to prevent surgery for her foot.    Pain Onset More than a month ago              Therex    Nu Step L2 x 5 min Seat 7 LE only for gentle LE strengthening    x2 trials best time 1.10m/s  SLS 2 trails bilat best time R 5sec L 3sec supervision for safety  PT reviewed the following HEP with patient with patient able to demonstrate a set of the following with min cuing for correction needed. PT educated patient on parameters of therex (  how/when to inc/decrease intensity, frequency, rep/set range, stretch hold time, and purpose of therex) with verbalized understanding.   Access Code: [email or web address] Hip Flexor Stretch on Step - 3 x daily - 7 x weekly - 30sec hold Standing Gastroc Stretch - 3 x daily - 7 x weekly - 30sec hold Standing Single Leg Stance with Counter Support - 3 x daily - 7 x weekly - 30sec hold Standing Hip Abduction with Counter Support - 1 x daily - 1-2 x weekly - 3 sets - 10 reps Heel Raises with Counter Support - 1 x daily - 1-2 x weekly - 3 sets - 10 reps  *cuing to prevent hip ER with abd                        PT Education - 07/26/21 1438     Education provided Yes    Education Details therex form/technique    Person(s) Educated Patient    Methods  Explanation;Demonstration;Verbal cues    Comprehension Verbalized understanding;Returned demonstration;Verbal cues required              PT Short Term Goals - 07/26/21 1439       PT SHORT TERM GOAL #1   Title Pt will demonstrate independence with HEP to improve L ankle function for increased ability to participate with ADLs    Baseline Hep given; 07/26/21 Completing without question or concern    Time 4    Period Weeks    Status New               PT Long Term Goals - 07/26/21 1441       PT LONG TERM GOAL #1   Title Patient will increase FOTO score to 66 to demonstrate predicted increase in functional mobility to complete ADLs    Baseline 9/28: 57; 07/26/21 78    Time 8    Period Weeks    Status Achieved      PT LONG TERM GOAL #2   Title Pt will increase gait speed in to 1.2 m/s to demonstrate appropriate speed for limited community ambulation    Baseline 0.90 m/s; 07/26/21 1.19m/s pt reports limited by hip    Time 8    Period Weeks    Status Achieved      PT LONG TERM GOAL #3   Title Pt will improve single leg stance to 15 sec or greater in order to demonstrate increased ankle stability, increased balance, and decrease likelihood of falling    Baseline 9/28 <5 sec; 07/26/21 L 5sec R 3sec    Time 8    Period Weeks    Status On-going                   Plan - 07/26/21 1512     Clinical Impression Statement Pt reassessed goals this session, where patient has made excellent progress. Patient demonstrates much faster gait speed with more normalized gait pattern, improved subjective function, and greater static balance of L SLS than RLE (increased ankle stability). Pt does not demonstrate age-matched norms for static SLS on either LE, but subjectively reports she can tell her overall balance has improved, especially when getting dressed in standing. Patient returns to MD Monday and would like to see what he thinks about her improvement, if she can d/c PT.  Pt maintains concerns of possibly rupturing something in her L ankle "like she did on the R" and would like further assurance from MD  about this. PT will plan on d/c from ankle episode, with possible evaluation of hip pain, pending this appt.    Personal Factors and Comorbidities Age;Sex;Comorbidity 2;Time since onset of injury/illness/exacerbation    Examination-Activity Limitations Locomotion Level;Stairs;Stand;Squat    Clinical Decision Making Moderate    Rehab Potential Good    Clinical Impairments Affecting Rehab Potential no known impairments affecting rehab potential    PT Frequency 2x / week    PT Duration 6 weeks    PT Treatment/Interventions Ultrasound;Neuromuscular re-education;Gait training;Cryotherapy;Patient/family education;Electrical Stimulation;Taping;Therapeutic activities;Iontophoresis 4mg /ml Dexamethasone;Moist Heat;Therapeutic exercise;Manual techniques;Passive range of motion;ADLs/Self Care Home Management;Balance training;Dry needling;Energy conservation;Functional mobility training;Stair training;Traction;Orthotic Fit/Training    PT Next Visit Plan hip/knee/ankle stability, alignment, proprioception    PT Home Exercise Plan ankle inversion, calf stretch, dorsiflexion, hip abd, SLS, heel raises    Consulted and Agree with Plan of Care Patient             Patient will benefit from skilled therapeutic intervention in order to improve the following deficits and impairments:  Difficulty walking, Pain, Improper body mechanics, Decreased strength, Decreased balance, Abnormal gait, Decreased endurance, Decreased mobility, Hypomobility, Impaired sensation, Increased edema, Decreased range of motion, Impaired flexibility, Increased fascial restricitons, Decreased activity tolerance  Visit Diagnosis: Chronic pain of left ankle     Problem List Patient Active Problem List   Diagnosis Date Noted   Anxiety 09/10/2016   Chronic cough 09/09/2015   Fatigue 09/09/2015    Secondary cardiomyopathy (HCC) 12/14/2010   Hyperlipidemia 05/03/2010   HYPERTENSION, BENIGN 05/03/2010   CARDIOMEGALY 05/03/2010   07/03/2010 DPT Hilda Lias, PT 07/26/2021, 3:51 PM  [facility] [address], [place] Phone: [phone]   Fax:  [phone]  Name: Arnitra C [phone] MRN: Ortiz Date of Birth: 07/03/1935

## 2021-07-31 ENCOUNTER — Encounter: Payer: Medicare Other | Admitting: Physical Therapy

## 2021-08-02 ENCOUNTER — Ambulatory Visit: Payer: Medicare Other | Admitting: Physical Therapy

## 2021-08-07 ENCOUNTER — Encounter: Payer: Medicare Other | Admitting: Physical Therapy

## 2021-10-17 ENCOUNTER — Other Ambulatory Visit: Payer: Self-pay | Admitting: Nurse Practitioner

## 2021-10-17 ENCOUNTER — Ambulatory Visit (INDEPENDENT_AMBULATORY_CARE_PROVIDER_SITE_OTHER): Payer: Medicare Other | Admitting: Nurse Practitioner

## 2021-10-17 ENCOUNTER — Encounter: Payer: Self-pay | Admitting: Nurse Practitioner

## 2021-10-17 ENCOUNTER — Other Ambulatory Visit: Payer: Self-pay

## 2021-10-17 VITALS — BP 134/80 | HR 108 | Ht 65.5 in | Wt 154.0 lb

## 2021-10-17 DIAGNOSIS — I10 Essential (primary) hypertension: Secondary | ICD-10-CM

## 2021-10-17 DIAGNOSIS — I4891 Unspecified atrial fibrillation: Secondary | ICD-10-CM

## 2021-10-17 DIAGNOSIS — I428 Other cardiomyopathies: Secondary | ICD-10-CM

## 2021-10-17 DIAGNOSIS — I4819 Other persistent atrial fibrillation: Secondary | ICD-10-CM | POA: Diagnosis not present

## 2021-10-17 DIAGNOSIS — I5022 Chronic systolic (congestive) heart failure: Secondary | ICD-10-CM

## 2021-10-17 DIAGNOSIS — E782 Mixed hyperlipidemia: Secondary | ICD-10-CM

## 2021-10-17 MED ORDER — CARVEDILOL 12.5 MG PO TABS: 18.7500 mg | ORAL_TABLET | Freq: Two times a day (BID) | ORAL | 3 refills | Status: DC

## 2021-10-17 MED ORDER — APIXABAN 5 MG PO TABS: 5.0000 mg | ORAL_TABLET | Freq: Two times a day (BID) | ORAL | 6 refills | Status: DC

## 2021-10-17 NOTE — Telephone Encounter (Signed)
Yes, I saw that.  Her Celebrex is taken PRN.

## 2021-10-17 NOTE — Progress Notes (Signed)
Office Visit    Patient Name: Robin Ortiz Date of Encounter: 10/17/2021  Primary Care Provider:  Albina Billet, MD Primary Cardiologist:  Ida Rogue, MD  Chief Complaint    87 year old female with a history of nonischemic cardiomyopathy, chronic heart failure with midrange ejection fraction, hypertension, hyperlipidemia, anxiety, and chronic fatigue, who presents for follow-up of fatigue, dyspnea, palpitations, and new finding of atrial fibrillation.  Past Medical History    Past Medical History:  Diagnosis Date   Anxiety    Cardiomegaly    Chronic fatigue    Chronic HFmrEF (heart failure with midrange ejection fraction) ([place])    a. 2002 Echo: EF 20-30%; b. 09/2015 Echo: EF 45-50%, diff HK. Mild MR. Mod dil LA.   Essential hypertension    Mixed hyperlipidemia    NICM (nonischemic cardiomyopathy) ([place])    Osteoporosis    Past Surgical History:  Procedure Laterality Date   ROTATOR CUFF REPAIR     left shoulder   ruptured tendon right foot Right    surgical repair   TOTAL KNEE ARTHROPLASTY Bilateral     Allergies  No Known Allergies  History of Present Illness    87 year old female with the above past medical history including nonischemic cardiomyopathy, chronic heart failure with midrange ejection fraction, hypertension, hyperlipidemia, anxiety, and chronic fatigue.  She was previously diagnosed with nonischemic cardiomyopathy in 2002, at which time her EF was 20 to 30%.  This subsequently improved and on most recent echo in December 2016, EF was 45 to 50% with diffuse hypokinesis, mild MR, and moderately dilated left atrium.    [name] was last seen in cardiology clinic in January 2022, at which time she was doing well.  Since about July 2022, she has noted development and progression of fatigue.  She had a lot of stress in her personal life with some deaths amongst friends and family, which she felt was weighing on her likely contributing to her symptoms.   She was not necessarily experiencing dyspnea on exertion but beginning about 2 weeks ago, she noted worsening fatigue with dyspnea on exertion and palpitations.  She does not routinely check her blood pressure and is unaware of what her heart rates have been running at home.  She denies chest pain, PND, orthopnea, dizziness, syncope, edema, or early satiety.  Because of symptoms, which have really worsened in the past 2 weeks, she arranged for appointment today.  Today, she is found to be in atrial fibrillation at a rate of 108 bpm.  Home Medications    Current Outpatient Medications  Medication Sig Dispense Refill   amLODipine (NORVASC) 5 MG tablet Take 1 tablet (5 mg total) by mouth daily. 90 tablet 3   apixaban (ELIQUIS) 5 MG TABS tablet Take 1 tablet (5 mg total) by mouth 2 (two) times daily. 60 tablet 6   Calcium-Vitamin D 600-200 MG-UNIT per tablet Take 1 tablet by mouth daily.     celecoxib (CELEBREX) 200 MG capsule Take 200 mg by mouth daily as needed.      furosemide (LASIX) 20 MG tablet Take 1 tablet (20 mg total) by mouth daily as needed. 30 tablet 0   losartan (COZAAR) 100 MG tablet Take 1 tablet (100 mg total) by mouth daily. 90 tablet 1   Misc Natural Products (OSTEO BI-FLEX ADV JOINT SHIELD PO) Take 1 tablet by mouth daily.     Multiple Vitamin (MULTIVITAMIN) tablet Take 1 tablet by mouth daily.     raloxifene (  EVISTA) 60 MG tablet Take 60 mg by mouth daily.     simvastatin (ZOCOR) 20 MG tablet Take 20 mg by mouth at bedtime.     carvedilol (COREG) 12.5 MG tablet Take 1.5 tablets (18.75 mg total) by mouth 2 (two) times daily with a meal. 90 tablet 3   No current facility-administered medications for this visit.     Review of Systems    Progressive fatigue, dyspnea, and palpitations.  Symptoms started back in July but have worsened in the past 2 weeks.  She denies chest pain, PND, orthopnea, dizziness, syncope, edema, or early satiety.  All other systems reviewed and are  otherwise negative except as noted above.    Physical Exam    VS:  BP 134/80 (BP Location: Left Arm, Patient Position: Sitting, Cuff Size: Normal)    Pulse (!) 108    Ht 5' 5.5" (1.664 m)    Wt 154 lb (69.9 kg)    SpO2 98%    BMI 25.24 kg/m  , BMI Body mass index is 25.24 kg/m.     GEN: Well nourished, well developed, in no acute distress. HEENT: normal. Neck: Supple, no JVD, carotid bruits, or masses. Cardiac: Irregularly irregular, tachycardic, no murmurs, rubs, or gallops. No clubbing, cyanosis, edema.  Radials 2+/PT 1+ and equal bilaterally.  Respiratory:  Respirations regular and unlabored, clear to auscultation bilaterally. GI: Soft, nontender, nondistended, BS + x 4. MS: no deformity or atrophy. Skin: warm and dry, no rash. Neuro:  Strength and sensation are intact. Psych: Normal affect.  Accessory Clinical Findings    ECG personally reviewed by me today -atrial fibrillation, 108,  prior septal infarct- no acute changes.  CBC, basic metabolic panel, TSH, and magnesium are pending  Assessment & Plan    1.  Persistent atrial fibrillation: Patient has a history of chronic fatigue and since July 2022, she has noted some worsening of this.  Over the past 2 weeks, fatigue has acutely worsened and become associated with dyspnea on exertion and palpitations.  She was unaware of what her heart rate has been trending at home.  Today, she is found to be in atrial fibrillation at 108 bpm.  ECG is otherwise unchanged, without acute ST or T changes.  We discussed the diagnosis of atrial fibrillation at length today.  I am increasing her carvedilol to 18.75 mg twice daily and will discontinue aspirin in favor of Eliquis 5 mg twice daily in the setting of a CHA2DS2-VASc of 5.  I will follow-up a basic metabolic panel, CBC, magnesium, and TSH today, and we will also arrange for a follow-up 2D echocardiogram.  I will see her back in clinic in approximately 3 weeks at which time, she remains in  atrial fibrillation, provided that she has not missed any doses of Eliquis, we can arrange for DCCV.  All questions answered.  I do note that she was in sinus bradycardia when seen in January 2022.  We will need to watch closely for bradycardia in the setting of beta-blocker titration, especially following conversion to sinus rhythm.  Will also address outpt sleep study in future.  2.  Nonischemic cardiomyopathy/chronic heart failure with midrange ejection fraction: EF previous as low as 20 to 30% in 2002 with subsequent improvement to 45 to 50% in December 2016.  She has been experiencing progressive dyspnea on exertion but no edema and weight has been stable.  I suspect A. fib is playing a role in her symptoms.  Plan to follow-up 2D  echocardiogram as outlined above.  Titrating carvedilol in the setting of rapid rates.  Continue ARB and as needed Lasix.  3.  Essential hypertension: Blood pressure minimally elevated 134/80 today.  Titrating carvedilol as outlined above.  4.  Hyperlipidemia: No recent labs through our system.  This is followed by PCP.  She is on simvastatin therapy.  5.  Disposition: Follow-up CBC, basic metabolic panel, magnesium, and TSH today.  Arrange for 2D echocardiogram.  Follow-up in clinic in 3 weeks or sooner if necessary.   Murray Hodgkins, NP 10/17/2021, 6:49 PM

## 2021-10-17 NOTE — Telephone Encounter (Signed)
Called the pharmacy and spoke with Idalia Needle, pharmacist, who states she does not need another prescription for Eliquis. She advised that there is a drug to drug interaction with Celebrex due to increase risk of bleeding. Confirmed with Melissa, Pharmacist and confirmed it was a risk for bleeding/bruising due to being an NSAID and will need to monitor. Will send to the nurse to update pt regarding monitoring for bleeding and bruising while on both (eliquis & celebrex) medication.  Also, denied duplicate Eliquis refill since they have received original refill.

## 2021-10-17 NOTE — Patient Instructions (Signed)
Medication Instructions:  - Your physician has recommended you make the following change in your medication:   1) STOP Aspirin  2) START Eliquis 5 mg: - take 1 tablet (5 mg) by mouth TWICE daily  3) INCREASE Carvedilol 12.5 mg: - take 1.5 tablets (18.75 mg) by mouth TWICE daily    Samples Given: Eliquis 5 mg Lot: [id number] Exp: 06/2023 # 2 boxes   *If you need a refill on your cardiac medications before your next appointment, please call your pharmacy*   Lab Work: - Your physician recommends that you have lab work today: BMP/ CBC/ TSH/ Magnesium  If you have labs (blood work) drawn today and your tests are completely normal, you will receive your results only by: MyChart Message (if you have MyChart) OR A paper copy in the mail If you have any lab test that is abnormal or we need to change your treatment, we will call you to review the results.   Testing/Procedures:  1) Echocardiogram: - Your physician has requested that you have an echocardiogram. Echocardiography is a painless test that uses sound waves to create images of your heart. It provides your doctor with information about the size and shape of your heart and how well your hearts chambers and valves are working. This procedure takes approximately one hour. There are no restrictions for this procedure. There is a possibility that an IV may need to be started during your test to inject an image enhancing agent. This is done to obtain more optimal pictures of your heart. Therefore we ask that you do at least drink some water prior to coming in to hydrate your veins.     Follow-Up: At [facility], you and your health needs are our priority.  As part of our continuing mission to provide you with exceptional heart care, we have created designated Provider Care Teams.  These Care Teams include your primary Cardiologist (physician) and Advanced Practice Providers (APPs -  Physician Assistants and Nurse Practitioners) who all  work together to provide you with the care you need, when you need it.  We recommend signing up for the patient portal called "MyChart".  Sign up information is provided on this After Visit Summary.  MyChart is used to connect with patients for Virtual Visits (Telemedicine).  Patients are able to view lab/test results, encounter notes, upcoming appointments, etc.  Non-urgent messages can be sent to your provider as well.   To learn more about what you can do with MyChart, go to [email or web address].    Your next appointment:   3 week(s)  The format for your next appointment:   In Person  Provider:   You may see Julien Nordmann, MD or one of the following Advanced Practice Providers on your designated Care Team:   Nicolasa Ducking, NP    Other Instructions  ELIQUIS (Apixaban) Tablets What is this medication? APIXABAN (a PIX a ban) prevents or treats blood clots. It is also used to lower the risk of stroke in people with AFib (atrial fibrillation). It belongs to a group of medications called blood thinners. This medicine may be used for other purposes; ask your health care provider or pharmacist if you have questions. COMMON BRAND NAME(S): Eliquis What should I tell my care team before I take this medication? They need to know if you have any of these conditions: Antiphospholipid antibody syndrome Bleeding disorder History of bleeding in the brain History of blood clots History of stomach bleeding Kidney disease Liver disease Mechanical heart  valve Spinal surgery An unusual or allergic reaction to apixaban, other medications, foods, dyes, or preservatives Pregnant or trying to get pregnant Breast-feeding How should I use this medication? Take this medication by mouth. For your therapy to work as well as possible, take each dose exactly as prescribed on the prescription label. Do not skip doses. Skipping doses or stopping this medication can increase your risk of a blood clot  or stroke. Keep taking this medication unless your care team tells you to stop. Take it as directed on the prescription label at the same time every day. You can take it with or without food. If it upsets your stomach, take it with food. A special MedGuide will be given to you by the pharmacist with each prescription and refill. Be sure to read this information carefully each time. Talk to your care team about the use of this medication in children. Special care may be needed. Overdosage: If you think you have taken too much of this medicine contact a poison control center or emergency room at once. NOTE: This medicine is only for you. Do not share this medicine with others. What if I miss a dose? If you miss a dose, take it as soon as you can. If it is almost time for your next dose, take only that dose. Do not take double or extra doses. What may interact with this medication? This medication may interact with the following: Aspirin and aspirin-like medications Certain medications for fungal infections like itraconazole and ketoconazole Certain medications for seizures like carbamazepine and phenytoin Certain medications for blood clots like enoxaparin, dalteparin, heparin, and warfarin Clarithromycin NSAIDs, medications for pain and inflammation, like ibuprofen or naproxen Rifampin Ritonavir St. John's wort This list may not describe all possible interactions. Give your health care provider a list of all the medicines, herbs, non-prescription drugs, or dietary supplements you use. Also tell them if you smoke, drink alcohol, or use illegal drugs. Some items may interact with your medicine. What should I watch for while using this medication? Visit your healthcare professional for regular checks on your progress. You may need blood work done while you are taking this medication. Your condition will be monitored carefully while you are receiving this medication. It is important not to miss any  appointments. Avoid sports and activities that might cause injury while you are using this medication. Severe falls or injuries can cause unseen bleeding. Be careful when using sharp tools or knives. Consider using an Neurosurgeon. Take special care brushing or flossing your teeth. Report any injuries, bruising, or red spots on the skin to your healthcare professional. If you are going to need surgery or other procedure, tell your healthcare professional that you are taking this medication. Wear a medical ID bracelet or chain. Carry a card that describes your disease and details of your medication and dosage times. What side effects may I notice from receiving this medication? Side effects that you should report to your care team as soon as possible: Allergic reactions--skin rash, itching, hives, swelling of the face, lips, tongue, or throat Bleeding--bloody or black, tar-like stools, vomiting blood or brown material that looks like coffee grounds, red or dark brown urine, small red or purple spots on the skin, unusual bruising or bleeding Bleeding in the brain--severe headache, stiff neck, confusion, dizziness, change in vision, numbness or weakness of the face, arm, or leg, trouble speaking, trouble walking, vomiting Heavy periods This list may not describe all possible side effects. Call your  doctor for medical advice about side effects. You may report side effects to FDA at 1-800-FDA-1088. Where should I keep my medication? Keep out of the reach of children and pets. Store at room temperature between 20 and 25 degrees C (68 and 77 degrees F). Get rid of any unused medication after the expiration date. To get rid of medications that are no longer needed or expired: Take the medication to a medication take-back program. Check with your pharmacy or law enforcement to find a location. If you cannot return the medication, check the label or package insert to see if the medication should be thrown  out in the garbage or flushed down the toilet. If you are not sure, ask your care team. If it is safe to put in the trash, empty the medication out of the container. Mix the medication with cat litter, dirt, coffee grounds, or other unwanted substance. Seal the mixture in a bag or container. Put it in the trash. NOTE: This sheet is a summary. It may not cover all possible information. If you have questions about this medicine, talk to your doctor, pharmacist, or health care provider.  2022 Elsevier/Gold Standard (2020-10-14 00:00:00)   Echocardiogram An echocardiogram is a test that uses sound waves (ultrasound) to produce images of the heart. Images from an echocardiogram can provide important information about: Heart size and shape. The size and thickness and movement of your heart's walls. Heart muscle function and strength. Heart valve function or if you have stenosis. Stenosis is when the heart valves are too narrow. If blood is flowing backward through the heart valves (regurgitation). A tumor or infectious growth around the heart valves. Areas of heart muscle that are not working well because of poor blood flow or injury from a heart attack. Aneurysm detection. An aneurysm is a weak or damaged part of an artery wall. The wall bulges out from the normal force of blood pumping through the body. Tell a health care provider about: Any allergies you have. All medicines you are taking, including vitamins, herbs, eye drops, creams, and over-the-counter medicines. Any blood disorders you have. Any surgeries you have had. Any medical conditions you have. Whether you are pregnant or may be pregnant. What are the risks? Generally, this is a safe test. However, problems may occur, including an allergic reaction to dye (contrast) that may be used during the test. What happens before the test? No specific preparation is needed. You may eat and drink normally. What happens during the test?  You  will take off your clothes from the waist up and put on a hospital gown. Electrodes or electrocardiogram (ECG)patches may be placed on your chest. The electrodes or patches are then connected to a device that monitors your heart rate and rhythm. You will lie down on a table for an ultrasound exam. A gel will be applied to your chest to help sound waves pass through your skin. A handheld device, called a transducer, will be pressed against your chest and moved over your heart. The transducer produces sound waves that travel to your heart and bounce back (or "echo" back) to the transducer. These sound waves will be captured in real-time and changed into images of your heart that can be viewed on a video monitor. The images will be recorded on a computer and reviewed by your health care provider. You may be asked to change positions or hold your breath for a short time. This makes it easier to get different views or  better views of your heart. In some cases, you may receive contrast through an IV in one of your veins. This can improve the quality of the pictures from your heart. The procedure may vary among health care providers and hospitals. What can I expect after the test? You may return to your normal, everyday life, including diet, activities, and medicines, unless your health care provider tells you not to do that. Follow these instructions at home: It is up to you to get the results of your test. Ask your health care provider, or the department that is doing the test, when your results will be ready. Keep all follow-up visits. This is important. Summary An echocardiogram is a test that uses sound waves (ultrasound) to produce images of the heart. Images from an echocardiogram can provide important information about the size and shape of your heart, heart muscle function, heart valve function, and other possible heart problems. You do not need to do anything to prepare before this test. You may  eat and drink normally. After the echocardiogram is completed, you may return to your normal, everyday life, unless your health care provider tells you not to do that. This information is not intended to replace advice given to you by your health care provider. Make sure you discuss any questions you have with your health care provider. Document Revised: 05/31/2021 Document Reviewed: 05/10/2020 Elsevier Patient Education  2022 [employer].

## 2021-10-18 ENCOUNTER — Telehealth: Payer: Self-pay | Admitting: Nurse Practitioner

## 2021-10-18 ENCOUNTER — Ambulatory Visit (INDEPENDENT_AMBULATORY_CARE_PROVIDER_SITE_OTHER): Payer: Medicare Other

## 2021-10-18 ENCOUNTER — Other Ambulatory Visit: Payer: Self-pay

## 2021-10-18 DIAGNOSIS — I4819 Other persistent atrial fibrillation: Secondary | ICD-10-CM | POA: Diagnosis not present

## 2021-10-18 LAB — CBC
Hematocrit: 36.2 % (ref 34.0–46.6)
Hemoglobin: 11.8 g/dL (ref 11.1–15.9)
MCH: 26.8 pg (ref 26.6–33.0)
MCHC: 32.6 g/dL (ref 31.5–35.7)
MCV: 82 fL (ref 79–97)
Platelets: 246 10*3/uL (ref 150–450)
RBC: 4.4 x10E6/uL (ref 3.77–5.28)
RDW: 13.4 % (ref 11.7–15.4)
WBC: 7.5 10*3/uL (ref 3.4–10.8)

## 2021-10-18 LAB — BASIC METABOLIC PANEL
BUN/Creatinine Ratio: 18 (ref 12–28)
BUN: 17 mg/dL (ref 8–27)
CO2: 23 mmol/L (ref 20–29)
Calcium: 10.1 mg/dL (ref 8.7–10.3)
Chloride: 108 mmol/L — ABNORMAL HIGH (ref 96–106)
Creatinine, Ser: 0.92 mg/dL (ref 0.57–1.00)
Glucose: 98 mg/dL (ref 70–99)
Potassium: 4.6 mmol/L (ref 3.5–5.2)
Sodium: 145 mmol/L — ABNORMAL HIGH (ref 134–144)
eGFR: 61 mL/min/{1.73_m2} (ref >59)

## 2021-10-18 LAB — ECHOCARDIOGRAM COMPLETE
AR max vel: 2.26 cm2
AV Area VTI: 1.92 cm2
AV Area mean vel: 2.09 cm2
AV Mean grad: 4 mmHg
AV Peak grad: 6.3 mmHg
Ao pk vel: 1.25 m/s
Calc EF: 42.1 %
MV M vel: 5.1 m/s
MV Peak grad: 103.8 mmHg
Radius: 0.4 cm
S' Lateral: 3.9 cm
Single Plane A2C EF: 39.7 %
Single Plane A4C EF: 46.6 %

## 2021-10-18 LAB — MAGNESIUM: Magnesium: 2.2 mg/dL (ref 1.6–2.3)

## 2021-10-18 LAB — TSH: TSH: 1.85 u[IU]/mL (ref 0.450–4.500)

## 2021-10-18 MED ORDER — AMLODIPINE BESYLATE 5 MG PO TABS: 5.0000 mg | ORAL_TABLET | Freq: Every day | ORAL | 3 refills | Status: DC

## 2021-10-18 NOTE — Telephone Encounter (Signed)
°*  STAT* If patient is at the pharmacy, call can be transferred to refill team.   1. Which medications need to be refilled? (please list name of each medication and dose if known) amlodipine 5 mg po q d   2. Which pharmacy/location (including street and city if local pharmacy) is medication to be sent to?[facility]  3. Do they need a 30 day or 90 day supply? [place]

## 2021-10-18 NOTE — Telephone Encounter (Signed)
amLODipine (NORVASC) 5 MG tablet 90 tablet 3 10/18/2021    Sig - Route: Take 1 tablet (5 mg total) by mouth daily. - Oral   Sent to pharmacy as: amLODipine (NORVASC) 5 MG tablet   E-Prescribing Status: Sent to pharmacy (10/18/2021  2:49 PM EST)    Pharmacy  [place], [place] - [place]

## 2021-11-13 ENCOUNTER — Encounter: Payer: Self-pay | Admitting: Cardiovascular Disease

## 2021-11-13 ENCOUNTER — Other Ambulatory Visit: Payer: Self-pay

## 2021-11-13 ENCOUNTER — Ambulatory Visit (INDEPENDENT_AMBULATORY_CARE_PROVIDER_SITE_OTHER): Payer: Medicare Other | Admitting: Cardiovascular Disease

## 2021-11-13 VITALS — BP 140/78 | HR 89 | Ht 65.5 in | Wt 154.2 lb

## 2021-11-13 DIAGNOSIS — R0602 Shortness of breath: Secondary | ICD-10-CM

## 2021-11-13 DIAGNOSIS — I4819 Other persistent atrial fibrillation: Secondary | ICD-10-CM | POA: Diagnosis not present

## 2021-11-13 MED ORDER — CARVEDILOL 25 MG PO TABS: 25.0000 mg | ORAL_TABLET | Freq: Two times a day (BID) | ORAL | 3 refills | Status: DC

## 2021-11-13 MED ORDER — POTASSIUM CHLORIDE ER 10 MEQ PO TBCR: 10.0000 meq | EXTENDED_RELEASE_TABLET | ORAL | 3 refills | Status: DC

## 2021-11-13 MED ORDER — FUROSEMIDE 20 MG PO TABS: 20.0000 mg | ORAL_TABLET | ORAL | 3 refills | Status: DC

## 2021-11-13 NOTE — Progress Notes (Signed)
Cardiology Office Note  Date:  11/13/2021   ID:  Robin Ortiz, DOB [birth year], MRN [id number]  PCP:  Robin Billet, MD   Chief Complaint  Patient presents with   3 week follow up     Patient c/o chest tightness/indigestion and shortness of breath. Medications reviewed by the patient verbally.     HPI:  [name] is a pleasant 87 year-old woman with a history of  hypertension,  hyperlipidemia,  remote history of nonischemic cardiomyopathy with initial ejection fraction 20-30% in 2002 that has improved to 50-55% on echocardiogram in March 2009,  status post bilateral knee replacement  Anxiety/gets stressed, chronic fatigue who presents for routine follow up  Of her cardiomyopathy.  Last seen in clinic by myself January 2022 Seen by one of our providers October 17, 2021, noted to be in atrial fibrillation  On discussion, reports symptoms started July 2022, she has noted development and progression of fatigue.  Likely developed atrial fibrillation around that time Worsening fatigue, SOB Ankle swelling  1/23 office visit Medication changes made, carvedilol to 18.75 mg twice daily Eliquis 5 mg twice daily in the setting of a CHA2DS2-VASc of 5.  Echocardiogram ordered EF previous as low as 20 to 30% in 2002  45 to 50% in December 2016.  Echo 10/18/2021 ef 35 to 40% Moderate mitral valve  regurgitation.  Left atrial size was severely dilated.  Tricuspid valve regurgitation is mild to moderate.  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  PSH:    Past Surgical History:  Procedure Laterality Date   ROTATOR CUFF REPAIR     left shoulder   ruptured tendon right foot Right    surgical repair   TOTAL KNEE ARTHROPLASTY Bilateral     Current Outpatient Medications  Medication Sig Dispense Refill   apixaban (ELIQUIS) 5 MG TABS tablet Take 1 tablet (5 mg total) by mouth 2 (two) times daily. 60 tablet 6   Calcium-Vitamin D 600-200 MG-UNIT per tablet Take 1 tablet by mouth daily.     celecoxib (CELEBREX) 200 MG capsule Take 200 mg by mouth daily as needed.      losartan (COZAAR) 100 MG tablet Take 1 tablet (100 mg total) by mouth daily. 90 tablet 1   Misc Natural Products (OSTEO BI-FLEX ADV JOINT SHIELD PO) Take 1 tablet by mouth daily.     Multiple Vitamin (MULTIVITAMIN) tablet Take 1 tablet by mouth daily.     potassium chloride (KLOR-CON) 10 MEQ tablet Take 1 tablet (10 mEq total) by mouth every Monday, Wednesday, and Friday. 39 tablet 3   raloxifene (EVISTA) 60 MG tablet Take 60 mg by mouth daily.     simvastatin (ZOCOR) 20 MG tablet Take 20 mg by mouth at bedtime.     carvedilol (COREG) 25 MG tablet Take 1 tablet (25 mg total) by mouth 2 (two) times daily with a meal. 180 tablet 3   furosemide (LASIX) 20 MG tablet Take 1 tablet (20 mg total) by mouth every Monday, Wednesday, and Friday. 39 tablet 3   No current facility-administered medications for  this visit.     Allergies:   Patient has no known allergies.   Social History:  The patient  reports that she has never smoked. She has never used smokeless tobacco. She reports current alcohol use of about 2.0 - 3.0 standard drinks per week. She reports that she does not use drugs.   Family History:   family history includes Breast cancer in her cousin; Breast cancer (age of onset: 25) in her maternal aunt; Breast cancer (age of onset: 16) in her maternal grandmother; Heart disease in her father.    Review of  Systems: Review of Systems  Constitutional:  Positive for malaise/fatigue.  HENT: Negative.    Respiratory: Negative.    Cardiovascular: Negative.   Gastrointestinal: Negative.   Musculoskeletal: Negative.   Neurological: Negative.   All other systems reviewed and are negative.  PHYSICAL EXAM: VS:  BP 140/78 (BP Location: Left Arm, Patient Position: Sitting, Cuff Size: Normal)    Pulse 89    Ht 5' 5.5" (1.664 m)    Wt 154 lb 4 oz (70 kg)    SpO2 98%    BMI 25.28 kg/m  , BMI Body mass index is 25.28 kg/m.  No change from previous exam GEN: Well nourished, well developed, in no acute distress  HEENT: normal  Neck: no JVD, carotid bruits, or masses Cardiac: RRR; no murmurs, rubs, or gallops,no edema  Respiratory:  clear to auscultation bilaterally, normal work of breathing GI: soft, nontender, nondistended, + BS MS: no deformity or atrophy  Skin: warm and dry, no rash Neuro:  Strength and sensation are intact Psych: euthymic mood, full affect  Recent Labs: 10/17/2021: BUN 17; Creatinine, Ser 0.92; Hemoglobin 11.8; Magnesium 2.2; Platelets 246; Potassium 4.6; Sodium 145; TSH 1.850    Lipid Panel No results found for: CHOL, HDL, LDLCALC, TRIG    Wt Readings from Last 3 Encounters:  11/13/21 154 lb 4 oz (70 kg)  10/17/21 154 lb (69.9 kg)  10/31/20 155 lb (70.3 kg)       ASSESSMENT AND PLAN:  Persistent atrial fibrillation She is inclined not to proceed with cardioversion at this time, does not want additional medications /antiarrhythmics Medication changes as below for better rate control  Mixed hyperlipidemia Continue statin  HYPERTENSION, BENIGN Medication changes as below  Chronic fatigue/anxiety Suggested walking program, restarting  water aerobics, May be worse from atrial fib, hard to tell  Anxiety significant stressors at home Stable , prior family stressors  Cardiomyopathy Presumed to be from atrial fibrillation Medication changes as below Coreg up to  25 mg twice a day Lasix /furosemide 20 mg three times a week (mon/wed/fri) Take with potassium 10 meq  Stop Amlodipine   Total encounter time more than 40  minutes  Greater than 50% was spent in counseling and coordination of care with the patient    Orders Placed This Encounter  Procedures   EKG 12-Lead     Signed, Esmond Plants, M.D., Ph.D. 11/13/2021  [place], [place]

## 2021-11-13 NOTE — Patient Instructions (Addendum)
Medication Instructions:  Please Increase Coreg up to 25 mg twice a day  Please START  Lasix /furosemide 20 mg three times a week  (mon/wed/fri) Take with potassium 10 meq   Please Stop  Amlodipine  If you need a refill on your cardiac medications before your next appointment, please call your pharmacy.   Lab work: No new labs needed  Testing/Procedures: No new testing needed  Follow-Up: At [facility], you and your health needs are our priority.  As part of our continuing mission to provide you with exceptional heart care, we have created designated Provider Care Teams.  These Care Teams include your primary Cardiologist (physician) and Advanced Practice Providers (APPs -  Physician Assistants and Nurse Practitioners) who all work together to provide you with the care you need, when you need it.  You will need a follow up appointment in 2  months, with Ignacia Bayley  Providers on your designated Care Team:   Murray Hodgkins, NP   COVID-19 Vaccine Information can be found at: [email or web address] For questions related to vaccine distribution or appointments, please email [email or web address] or call [phone].

## 2021-11-27 ENCOUNTER — Other Ambulatory Visit: Payer: Self-pay

## 2021-11-27 ENCOUNTER — Ambulatory Visit (INDEPENDENT_AMBULATORY_CARE_PROVIDER_SITE_OTHER): Payer: Medicare Other | Admitting: Cardiovascular Disease

## 2021-11-27 ENCOUNTER — Encounter: Payer: Self-pay | Admitting: Cardiovascular Disease

## 2021-11-27 VITALS — BP 130/90 | HR 97 | Ht 65.5 in | Wt 158.0 lb

## 2021-11-27 DIAGNOSIS — I4819 Other persistent atrial fibrillation: Secondary | ICD-10-CM | POA: Diagnosis not present

## 2021-11-27 DIAGNOSIS — I10 Essential (primary) hypertension: Secondary | ICD-10-CM

## 2021-11-27 MED ORDER — AMIODARONE HCL 200 MG PO TABS: ORAL_TABLET | ORAL | 0 refills | Status: DC

## 2021-11-27 NOTE — Patient Instructions (Addendum)
Medication Instructions:  - Your physician has recommended you make the following change in your medication:   1) START amiodarone 200 mg: - take 2 tablets (400 mg) by mouth twice a day x 7 days,  - then take 1 tablet (200 mg) twice a day   If you need a refill on your cardiac medications before your next appointment, please call your pharmacy.   Lab work: No new labs needed  Testing/Procedures: No new testing needed  Follow-Up: At [facility], you and your health needs are our priority.  As part of our continuing mission to provide you with exceptional heart care, we have created designated Provider Care Teams.  These Care Teams include your primary Cardiologist (physician) and Advanced Practice Providers (APPs -  Physician Assistants and Nurse Practitioners) who all work together to provide you with the care you need, when you need it.  You will need a follow up appointment in 2 weeks (Dr. Rockey Situ APP) Will need EKG in 2 weeks If still in atrial fibrillation, we will set up a cardioversion  Providers on your designated Care Team:   Murray Hodgkins, NP Christell Faith, PA-C Cadence Kathlen Mody, PA-C  COVID-19 Vaccine Information can be found at: [email or web address] For questions related to vaccine distribution or appointments, please email [email or web address] or call [phone].

## 2021-11-27 NOTE — Progress Notes (Signed)
Cardiology Office Note  Date:  11/27/2021   ID:  Robin Ortiz, DOB 1934-11-30, MRN [id number]  PCP:  Albina Billet, MD   Chief Complaint  Patient presents with   Shortness of Breath    Patient c/o chest tightness & shortness of breath with little to no exertion. Medications reviewed by the patient verbally.     HPI:  [name] is a pleasant 87 year-old woman with a history of  hypertension,  hyperlipidemia,  remote history of nonischemic cardiomyopathy with initial ejection fraction 20-30% in 2002 that has improved to 50-55% on echocardiogram in March 2009,  status post bilateral knee replacement  Anxiety/gets stressed, chronic fatigue  CHA2DS2-VASc of 5. who presents for routine follow up  Of her cardiomyopathy.  July 2022, she has noted development and progression of fatigue.  Possibly developed atrial fibrillation around that time?  Seen by one of our providers October 17, 2021, in atrial fibrillation  LOV 11/13/21 Declined cardioversion, antiarrhythmics  No energy, SOB, worse recently  Echo 10/18/21  1. Left ventricular ejection fraction, by estimation, is 35 to 40%. The  left ventricle has moderately decreased function. The left ventricle has  no regional wall motion abnormalities. Left ventricular diastolic  parameters are indeterminate.   2. Right ventricular systolic function is low normal. The right  ventricular size is normal.   3. Left atrial size was severely dilated.    EKG personally reviewed by myself on todays visit shows atrial fibrillation with rate 97 bpm, no significant ST or T-wave changes   Echocardiogram  EF previous as low as 20 to 30% in 2002  45 to 50% in December 2016.  Echo 10/18/2021 ef 35 to 40% Moderate mitral valve  regurgitation.  Left atrial size was severely dilated.  Tricuspid valve regurgitation is mild to moderate.   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  PSH:    Past Surgical History:  Procedure Laterality Date   ROTATOR CUFF REPAIR     left shoulder   ruptured tendon right foot Right    surgical repair   TOTAL KNEE ARTHROPLASTY Bilateral     Current Outpatient Medications  Medication Sig Dispense Refill   amiodarone (PACERONE) 200 MG tablet Take 2 tablets (400 mg) by mouth twice daily x 7 days, then take 1 tablet (200 mg) by mouth twice daily 50 tablet 0   apixaban (ELIQUIS) 5 MG TABS tablet Take 1 tablet (5 mg total) by mouth 2 (two) times daily. 60 tablet 6   Calcium-Vitamin D 600-200 MG-UNIT per tablet Take 1 tablet by mouth daily.     carvedilol (COREG) 25 MG tablet Take 1 tablet (25 mg total) by mouth 2 (two) times daily with a meal. 180 tablet 3   celecoxib (CELEBREX) 200 MG capsule Take 200 mg by mouth daily as needed.      diphenhydrAMINE-APAP, sleep, (TYLENOL PM EXTRA STRENGTH PO) Take by mouth as needed.     furosemide (LASIX) 20 MG tablet Take 1 tablet (20 mg total) by mouth every Monday, Wednesday, and Friday. 39 tablet 3   losartan (COZAAR) 100 MG tablet Take 1 tablet (100 mg total) by mouth daily. 90 tablet 1   Misc Natural Products (OSTEO BI-FLEX ADV JOINT SHIELD PO) Take 1 tablet by mouth daily.     Multiple Vitamin (MULTIVITAMIN)  tablet Take 1 tablet by mouth daily.     potassium chloride (KLOR-CON) 10 MEQ tablet Take 1 tablet (10 mEq total) by mouth every Monday, Wednesday, and Friday. 39 tablet 3   raloxifene (EVISTA) 60 MG tablet Take 60 mg by mouth daily.     simvastatin (ZOCOR) 20 MG tablet Take 20 mg by mouth at bedtime.     No current facility-administered medications for this visit.     Allergies:   Patient has no known allergies.   Social History:  The patient  reports that she has never smoked. She has never used smokeless  tobacco. She reports current alcohol use of about 2.0 - 3.0 standard drinks per week. She reports that she does not use drugs.   Family History:   family history includes Breast cancer in her cousin; Breast cancer (age of onset: 8) in her maternal aunt; Breast cancer (age of onset: 86) in her maternal grandmother; Heart disease in her father.    Review of Systems: Review of Systems  Constitutional:  Positive for malaise/fatigue.  HENT: Negative.    Respiratory: Negative.    Cardiovascular: Negative.   Gastrointestinal: Negative.   Musculoskeletal: Negative.   Neurological: Negative.   All other systems reviewed and are negative.  PHYSICAL EXAM: VS:  BP 130/90 (BP Location: Left Arm, Patient Position: Sitting, Cuff Size: Normal)    Pulse 97    Ht 5' 5.5" (1.664 m)    Wt 158 lb (71.7 kg)    SpO2 99%    BMI 25.89 kg/m  , BMI Body mass index is 25.89 kg/m.  Constitutional:  oriented to person, place, and time. No distress.  HENT:  Head: Grossly normal Eyes:  no discharge. No scleral icterus.  Neck: No JVD, no carotid bruits  Cardiovascular: irreg irreg, no murmurs appreciated Pulmonary/Chest: Clear to auscultation bilaterally, no wheezes or rails Abdominal: Soft.  no distension.  no tenderness.  Musculoskeletal: Normal range of motion Neurological:  normal muscle tone. Coordination normal. No atrophy Skin: Skin warm and dry Psychiatric: normal affect, pleasant   Recent Labs: 10/17/2021: BUN 17; Creatinine, Ser 0.92; Hemoglobin 11.8; Magnesium 2.2; Platelets 246; Potassium 4.6; Sodium 145; TSH 1.850    Lipid Panel No results found for: CHOL, HDL, LDLCALC, TRIG    Wt Readings from Last 3 Encounters:  11/27/21 158 lb (71.7 kg)  11/13/21 154 lb 4 oz (70 kg)  10/17/21 154 lb (69.9 kg)       ASSESSMENT AND PLAN:  Persistent atrial fibrillation Feels poorly with tachycardia, shortness of breath Previously declined antiarrhythmic and cardioversion Given worsening symptoms  we have recommended she start amiodarone load 400 twice daily for 1 week then down to 200 twice daily \\EKG  in 2 weeks time If still in atrial fibrillation would arrange cardioversion Given severely dilated left atrium, will need antiarrhythmic, this was discussed with her High risk of recurrent atrial fibrillation  Mixed hyperlipidemia Continue statin  HYPERTENSION, BENIGN Blood pressure is well controlled on today's visit. No changes made to the medications.  Chronic fatigue/anxiety Symptoms likely exacerbated from atrial fibrillation We will plan to restore normal sinus rhythm as above  Anxiety significant stressors at home Stable , prior family stressors  Cardiomyopathy Presumed to be from atrial fibrillation Continue Coreg, Lasix with potassium Restore normal sinus rhythm   Total encounter time more than 40  minutes  Greater than 50% was spent in counseling and coordination of care with the patient    Orders Placed This Encounter  Procedures  EKG 12-Lead     Signed, Esmond Plants, M.D., Ph.D. 11/27/2021  [place], [place]

## 2021-12-01 ENCOUNTER — Ambulatory Visit: Payer: Medicare Other | Admitting: Cardiology

## 2021-12-01 ENCOUNTER — Telehealth: Payer: Self-pay | Admitting: Cardiovascular Disease

## 2021-12-01 MED ORDER — AMIODARONE HCL 200 MG PO TABS: 200.0000 mg | ORAL_TABLET | Freq: Two times a day (BID) | ORAL | Status: DC

## 2021-12-01 NOTE — Telephone Encounter (Signed)
Patient called and cancelled her appointment for this afternoon, states she is feeling better, and will await her 3/14 appointment. Please call to advise of Amiodarone, if she should continue to take.

## 2021-12-01 NOTE — Addendum Note (Signed)
Addended by: Jarvis Newcomer on: 12/01/2021 01:44 PM ? ? Modules accepted: Orders ? ?

## 2021-12-01 NOTE — Telephone Encounter (Addendum)
Patient sts that she was seen by her pcp today. ?Patient cancelled her DOD appt today statign that she feels much better. ? ?Patient sts that her VS and exam were fine today at her pcps office. Pt sts that her lungs were clear and there was no signs of her holding on to excess fluid. ?Pt sts that she is not currently sob and feels better. Her pcp recommended that she go ahead and decrease her Amiodarone to 200 mg bid. She has taken the loading dose of 400 mg bid x4 days. ? ?Patient sts that she will f/u as planned on 3/14 with Dr. Mariah Milling and will contact the office sooner if symptoms develop. ? ?Adv the patient that I will fwd the update to Dr. Mariah Milling. ? ? ?

## 2021-12-01 NOTE — Telephone Encounter (Signed)
Call transferred directly to this RN from scheduling. ? ?I spoke with the patient. ?She was calling this morning with increased SOB since Monday 11/27/21. ? ?The patient was seen on 11/27/21 with Dr. Rockey Situ.  ?Per MD note: ?Persistent atrial fibrillation ?Feels poorly with tachycardia, shortness of breath ?Previously declined antiarrhythmic and cardioversion ?Given worsening symptoms we have recommended she start amiodarone load 400 twice daily for 1 week then down to 200 twice daily ?\EKG in 2 weeks time ?If still in atrial fibrillation would arrange cardioversion ?Given severely dilated left atrium, will need antiarrhythmic, this was discussed with her ?High risk of recurrent atrial fibrillation ? ? ?The patient confirms she did start the amiodarone 400 mg BID on 11/27/21. ?She has noticed progressively worsening SOB since that time. ?She does not check her HR/ BP at home as she does not know where the cuff is and she is too SOB to look for this now. ? ?She confirms she gets SOB when walking from one room to the other, which was not occurring prior to her starting amiodarone. ? ?I have advised the patient that we can see her in the clinic today with Dr. Garen Lah (DOD) to further assess her symptoms. ?I have advised that her increased SOB may be coming from the amiodarone, but with no documentation of what her HR's are, I would prefer not to tell her to hold this all weekend without assessing her HR's first. ?The patient advised she does not feel like she is going fast, but would appreciate someone looking at her today.  ? ?She is scheduled to be seen at 3:20 pm today with Dr. Garen Lah. ?I have asked her to hold her amiodarone until seen in the office (she confirms she her last dose of amiodarone was last night). ?She is also advised if her SOB worsens to the point she feels like she cannot tolerate this, then she is advised to call 911. ? ?The patient voices understanding and is agreeable.  ?

## 2021-12-01 NOTE — Telephone Encounter (Signed)
Pt c/o Shortness Of Breath: STAT if SOB developed within the last 24 hours or pt is noticeably SOB on the phone  1. Are you currently SOB (can you hear that pt is SOB on the phone)? yes  2. How long have you been experiencing SOB? Since she has started he new medication,Amiodarone,   it has gotten worse, patient has been up since 1 am trying to breathe  3. Are you SOB when sitting or when up moving around? Last night it was all the time, before it was not as bad  4. Are you currently experiencing any other symptoms? Ribs, back and side hurt from gasping for breath.

## 2021-12-04 NOTE — Telephone Encounter (Signed)
Able to return call to Robin Ortiz, reviewed her medications with her. Reports her PCP reduce Amiodarone to 200 mg bid, advised to continue that dose until seen by Dr. Mariah Milling on 2/14, can discuss medications changes at that time.  ? ?Pt reports still with some SOB, over the weekend low energy, unsure if related to allergies or given her prior cardiac hx, reports her PCP said her lungs "were good". Advised to keep her upcoming appt next week with Dr. Mariah Milling to discuss further, if her s/s gets worse, started to develop CP, weakness, or dizziness then please seek medical attention, not to wait until next appt. At this time no opening to get in for sooner appt. Robin Ortiz verbalized understanding. ? ?Otherwise all questions were address and no additional concerns at this time. Robin Ortiz thankful for the return call and advice. Agreeable to plan, will call back for anything further.   ? ?

## 2021-12-04 NOTE — Telephone Encounter (Signed)
Patient calling  ?States that she didn't feel well over the weekend - still some SOB ?Would like reassurance that she is doing the right thing with medication  ?States she saw PCP on Friday and they said things were ok  ?Please call to discuss  ?

## 2021-12-06 NOTE — Telephone Encounter (Signed)
Patient feels her same symptoms are getting worse - not sure she can wait til 03/14 appt  ?Nothing available sooner at this time ?Would like to discuss with nurse  ?

## 2021-12-06 NOTE — Telephone Encounter (Signed)
Was able to return pt call regarding her SOB, she reports "I am not getting worse, I just not getting better", reports "I get short winded" at times I "gasp for my breath" at times. ? ?Advised pt on ER for her breathing concerns. Still no sooner appt with Dr. Mariah Ortiz or other provider, appt for next week 3/14. Pt reports "I will hold on until then, if it gets absolutely worse, then I will go to the ER".  ? ?Pt reports still on her current medication regiments, no new swelling to LE. Denies A-fib, reports not HR or BP to report. Pt st her son has her pulse ox, advise to see if she can retreive it or get another to monitor her oxygen. Pt verbalized understanding.  ? ?Per Dr. Ethelene Ortiz OV notes from 11/27/2021 ?Chief Complaint  ?Patient presents with  ? Shortness of Breath  ?    Patient c/o chest tightness & shortness of breath with little to no exertion  ?Review of Systems: ?Review of Systems  ?Constitutional:  Positive for malaise/fatigue.  ? ?Symptoms do not appear new, pt reports herself "I am not getting worse, I just not getting better". Verbalized understanding to pt, still recommend on ER visit for worsening of her SOB and if she continues to "gasp for my breath", otherwise will see pt next week, she is on wait list for sooner appt if one comes available.  ? ?Otherwise all questions were address and no additional concerns at this time. Robin Ortiz thankful for the return call and advice. Agreeable to plan, will call back for anything further.   ? ?

## 2021-12-11 ENCOUNTER — Ambulatory Visit
Admission: RE | Admit: 2021-12-11 | Discharge: 2021-12-11 | Disposition: A | Discharge status: Home / Self Care | Payer: Medicare Other | Source: Ambulatory Visit | Attending: Internal Medicine | Admitting: Internal Medicine

## 2021-12-11 ENCOUNTER — Other Ambulatory Visit: Payer: Self-pay | Admitting: Internal Medicine

## 2021-12-11 ENCOUNTER — Other Ambulatory Visit: Payer: Self-pay

## 2021-12-11 ENCOUNTER — Ambulatory Visit
Admission: RE | Admit: 2021-12-11 | Discharge: 2021-12-11 | Disposition: A | Discharge status: Home / Self Care | Payer: Medicare Other | Attending: Internal Medicine | Admitting: Internal Medicine

## 2021-12-11 DIAGNOSIS — I4891 Unspecified atrial fibrillation: Secondary | ICD-10-CM

## 2021-12-11 NOTE — Progress Notes (Unsigned)
Cardiology Office Note  Date:  12/11/2021   ID:  Robin Ortiz, DOB 03/03/1935, MRN [id number]  PCP:  Albina Billet, MD   No chief complaint on file.   HPI:  [name] is a pleasant 87 year-old woman with a history of  hypertension,  hyperlipidemia,  remote history of nonischemic cardiomyopathy with initial ejection fraction 20-30% in 2002 that has improved to 50-55% on echocardiogram in March 2009,  status post bilateral knee replacement  Anxiety/gets stressed, chronic fatigue  CHA2DS2-VASc of 5. who presents for routine follow up  Of her cardiomyopathy.  July 2022, she has noted development and progression of fatigue.  Possibly developed atrial fibrillation around that time?  Seen by one of our providers October 17, 2021, in atrial fibrillation  LOV 11/13/21 Declined cardioversion, antiarrhythmics  No energy, SOB, worse recently  Echo 10/18/21  1. Left ventricular ejection fraction, by estimation, is 35 to 40%. The  left ventricle has moderately decreased function. The left ventricle has  no regional wall motion abnormalities. Left ventricular diastolic  parameters are indeterminate.   2. Right ventricular systolic function is low normal. The right  ventricular size is normal.   3. Left atrial size was severely dilated.    EKG personally reviewed by myself on todays visit shows atrial fibrillation with rate 97 bpm, no significant ST or T-wave changes   Echocardiogram  EF previous as low as 20 to 30% in 2002  45 to 50% in December 2016.  Echo 10/18/2021 ef 35 to 40% Moderate mitral valve  regurgitation.  Left atrial size was severely dilated.  Tricuspid valve regurgitation is mild to moderate.   PMH:   has a past medical history of Anxiety, Cardiomegaly, Chronic fatigue, Chronic HFmrEF (heart failure with midrange ejection fraction) ([place]), Essential hypertension, Mixed hyperlipidemia, NICM (nonischemic cardiomyopathy) ([place]), Osteoporosis, and Persistent atrial  fibrillation ([place]).  PSH:    Past Surgical History:  Procedure Laterality Date   ROTATOR CUFF REPAIR     left shoulder   ruptured tendon right foot Right    surgical repair   TOTAL KNEE ARTHROPLASTY Bilateral     Current Outpatient Medications  Medication Sig Dispense Refill   amiodarone (PACERONE) 200 MG tablet Take 1 tablet (200 mg total) by mouth 2 (two) times daily.     apixaban (ELIQUIS) 5 MG TABS tablet Take 1 tablet (5 mg total) by mouth 2 (two) times daily. 60 tablet 6   Calcium-Vitamin D 600-200 MG-UNIT per tablet Take 1 tablet by mouth daily.     carvedilol (COREG) 25 MG tablet Take 1 tablet (25 mg total) by mouth 2 (two) times daily with a meal. 180 tablet 3   celecoxib (CELEBREX) 200 MG capsule Take 200 mg by mouth daily as needed.      diphenhydrAMINE-APAP, sleep, (TYLENOL PM EXTRA STRENGTH PO) Take by mouth as needed.     furosemide (LASIX) 20 MG tablet Take 1 tablet (20 mg total) by mouth every Monday, Wednesday, and Friday. 39 tablet 3   losartan (COZAAR) 100 MG tablet Take 1 tablet (100 mg total) by mouth daily. 90 tablet 1   Misc Natural Products (OSTEO BI-FLEX ADV JOINT SHIELD PO) Take 1 tablet by mouth daily.     Multiple Vitamin (MULTIVITAMIN) tablet Take 1 tablet by mouth daily.     potassium chloride (KLOR-CON) 10 MEQ tablet Take 1 tablet (10 mEq total) by mouth every Monday, Wednesday, and Friday. 39 tablet 3   raloxifene (EVISTA) 60 MG tablet Take  60 mg by mouth daily.     simvastatin (ZOCOR) 20 MG tablet Take 20 mg by mouth at bedtime.     No current facility-administered medications for this visit.     Allergies:   Patient has no known allergies.   Social History:  The patient  reports that she has never smoked. She has never used smokeless tobacco. She reports current alcohol use of about 2.0 - 3.0 standard drinks per week. She reports that she does not use drugs.   Family History:   family history includes Breast cancer in her cousin; Breast cancer  (age of onset: 64) in her maternal aunt; Breast cancer (age of onset: 67) in her maternal grandmother; Heart disease in her father.    Review of Systems: Review of Systems  Constitutional:  Positive for malaise/fatigue.  HENT: Negative.    Respiratory: Negative.    Cardiovascular: Negative.   Gastrointestinal: Negative.   Musculoskeletal: Negative.   Neurological: Negative.   All other systems reviewed and are negative.  PHYSICAL EXAM: VS:  There were no vitals taken for this visit. , BMI There is no height or weight on file to calculate BMI.  Constitutional:  oriented to person, place, and time. No distress.  HENT:  Head: Grossly normal Eyes:  no discharge. No scleral icterus.  Neck: No JVD, no carotid bruits  Cardiovascular: irreg irreg, no murmurs appreciated Pulmonary/Chest: Clear to auscultation bilaterally, no wheezes or rails Abdominal: Soft.  no distension.  no tenderness.  Musculoskeletal: Normal range of motion Neurological:  normal muscle tone. Coordination normal. No atrophy Skin: Skin warm and dry Psychiatric: normal affect, pleasant   Recent Labs: 10/17/2021: BUN 17; Creatinine, Ser 0.92; Hemoglobin 11.8; Magnesium 2.2; Platelets 246; Potassium 4.6; Sodium 145; TSH 1.850    Lipid Panel No results found for: CHOL, HDL, LDLCALC, TRIG    Wt Readings from Last 3 Encounters:  11/27/21 158 lb (71.7 kg)  11/13/21 154 lb 4 oz (70 kg)  10/17/21 154 lb (69.9 kg)       ASSESSMENT AND PLAN:  Persistent atrial fibrillation Feels poorly with tachycardia, shortness of breath Previously declined antiarrhythmic and cardioversion Given worsening symptoms we have recommended she start amiodarone load 400 twice daily for 1 week then down to 200 twice daily \\EKG  in 2 weeks time If still in atrial fibrillation would arrange cardioversion Given severely dilated left atrium, will need antiarrhythmic, this was discussed with her High risk of recurrent atrial  fibrillation  Mixed hyperlipidemia Continue statin  HYPERTENSION, BENIGN Blood pressure is well controlled on today's visit. No changes made to the medications.  Chronic fatigue/anxiety Symptoms likely exacerbated from atrial fibrillation We will plan to restore normal sinus rhythm as above  Anxiety significant stressors at home Stable , prior family stressors  Cardiomyopathy Presumed to be from atrial fibrillation Continue Coreg, Lasix with potassium Restore normal sinus rhythm   Total encounter time more than 40  minutes  Greater than 50% was spent in counseling and coordination of care with the patient    No orders of the defined types were placed in this encounter.    Signed, Esmond Plants, M.D., Ph.D. 12/11/2021  [place], [place]

## 2021-12-12 ENCOUNTER — Other Ambulatory Visit: Payer: Self-pay | Admitting: Cardiovascular Disease

## 2021-12-12 ENCOUNTER — Other Ambulatory Visit: Payer: Self-pay

## 2021-12-12 ENCOUNTER — Ambulatory Visit (INDEPENDENT_AMBULATORY_CARE_PROVIDER_SITE_OTHER): Payer: Medicare Other | Admitting: Cardiovascular Disease

## 2021-12-12 ENCOUNTER — Encounter: Payer: Self-pay | Admitting: Cardiovascular Disease

## 2021-12-12 ENCOUNTER — Other Ambulatory Visit
Admission: RE | Admit: 2021-12-12 | Discharge: 2021-12-12 | Disposition: A | Discharge status: Home / Self Care | Payer: Medicare Other | Attending: Cardiovascular Disease | Admitting: Cardiovascular Disease

## 2021-12-12 VITALS — BP 160/109 | HR 97 | Ht 65.0 in | Wt 158.0 lb

## 2021-12-12 DIAGNOSIS — I4819 Other persistent atrial fibrillation: Secondary | ICD-10-CM | POA: Diagnosis not present

## 2021-12-12 DIAGNOSIS — R0602 Shortness of breath: Secondary | ICD-10-CM | POA: Diagnosis not present

## 2021-12-12 DIAGNOSIS — I5022 Chronic systolic (congestive) heart failure: Secondary | ICD-10-CM

## 2021-12-12 DIAGNOSIS — I428 Other cardiomyopathies: Secondary | ICD-10-CM

## 2021-12-12 DIAGNOSIS — E782 Mixed hyperlipidemia: Secondary | ICD-10-CM

## 2021-12-12 DIAGNOSIS — I10 Essential (primary) hypertension: Secondary | ICD-10-CM

## 2021-12-12 DIAGNOSIS — Z01810 Encounter for preprocedural cardiovascular examination: Secondary | ICD-10-CM

## 2021-12-12 LAB — BASIC METABOLIC PANEL
Anion gap: 8 (ref 5–15)
BUN: 28 mg/dL — ABNORMAL HIGH (ref 8–23)
CO2: 25 mmol/L (ref 22–32)
Calcium: 9.8 mg/dL (ref 8.9–10.3)
Chloride: 104 mmol/L (ref 98–111)
Creatinine, Ser: 1.35 mg/dL — ABNORMAL HIGH (ref 0.44–1.00)
GFR, Estimated: 39 mL/min — ABNORMAL LOW (ref >60)
Glucose, Bld: 140 mg/dL — ABNORMAL HIGH (ref 70–99)
Potassium: 4.2 mmol/L (ref 3.5–5.1)
Sodium: 137 mmol/L (ref 135–145)

## 2021-12-12 LAB — CBC
HCT: 38.1 % (ref 36.0–46.0)
Hemoglobin: 11.8 g/dL — ABNORMAL LOW (ref 12.0–15.0)
MCH: 26.2 pg (ref 26.0–34.0)
MCHC: 31 g/dL (ref 30.0–36.0)
MCV: 84.7 fL (ref 80.0–100.0)
Platelets: 242 10*3/uL (ref 150–400)
RBC: 4.5 MIL/uL (ref 3.87–5.11)
RDW: 16 % — ABNORMAL HIGH (ref 11.5–15.5)
WBC: 8.5 10*3/uL (ref 4.0–10.5)
nRBC: 0 % (ref 0.0–0.2)

## 2021-12-12 MED ORDER — LOSARTAN POTASSIUM 100 MG PO TABS: 100.0000 mg | ORAL_TABLET | Freq: Every day | ORAL | 3 refills | Status: DC

## 2021-12-12 NOTE — Patient Instructions (Signed)
Medication Instructions:  ?Stay on amiodarone 200 mg twice a day ? ?If you need a refill on your cardiac medications before your next appointment, please call your pharmacy.  ? ?Lab work: ?BMP & CBC ? Please go to [facility] to have drawn ? ?Testing/Procedures: ?Cardioversion for atrial fibrillation ?    Cardioversion ? ?We will half to call with date and time for cardioversion with Dr. Rockey Situ ? ?Please arrive at the [facility], free valet parking is available  ?[address] [place], [place] [address] ? ?INSTRUCTIONS:  ? ?Labs: CBC & BMP (within 30 days) ? ?Nothing to eat or drink after midnight  ?May take your night time medications with a small sip of  water                 ? ?Medications:  ?HOLD: Lasix the morning of procedure (can take after) ?YOU MAY TAKE ALL of your remaining medications with a small amount of water. ?Continue your anticoagulant: Eliquis ?You will need to continue your anticoagulant after your procedure until you are told by your provider that it is safe to stop ? ?Must have a responsible person to drive you home. ?They must stay in the waiting area during your procedure.  ?Failure to do so could result in cancellation. ? ?Bring a current list of your medications and current insurance cards.  ? ?If you have any questions after you get home, please call the office at [phone] ? ?FYI: For your safety, and to allow [place] to monitor your vital signs accurately during the surgery/procedure we request that  if you have artificial nails, gel coating, SNS etc. Please have those removed prior to your surgery/procedure. Not having the nail coverings /polish removed may result in cancellation or delay of your surgery/procedure. ? ? ?Follow-Up: ?At [facility], you and your health needs are our priority.  As part of our continuing mission to provide you with exceptional heart care, we have created designated Provider Care Teams.  These Care Teams include your primary Cardiologist  (physician) and Advanced Practice Providers (APPs -  Physician Assistants and Nurse Practitioners) who all work together to provide you with the care you need, when you need it. ? ?You will need a follow up appointment in 1 month, APP ok ? ?Providers on your designated Care Team:   ?Murray Hodgkins, NP ?Christell Faith, PA-C ?Cadence Kathlen Mody, PA-C ? ?COVID-19 Vaccine Information can be found at: [email or web address].co.uk For questions related to vaccine distribution or appointments, please email [email or web address] .com or call 8151232548.  ? ?

## 2021-12-13 ENCOUNTER — Other Ambulatory Visit: Payer: Self-pay | Admitting: Cardiovascular Disease

## 2021-12-13 ENCOUNTER — Telehealth: Payer: Self-pay

## 2021-12-13 DIAGNOSIS — I4819 Other persistent atrial fibrillation: Secondary | ICD-10-CM

## 2021-12-13 MED ORDER — SODIUM CHLORIDE 0.9 % IV SOLN
INTRAVENOUS | Status: DC
  Administered 2021-12-14: 1000 mL via INTRAVENOUS

## 2021-12-13 NOTE — Telephone Encounter (Signed)
Was able to reach out to Robin Ortiz to advised on her time and date for her DCCV that was discussed at her OV yesterday with Dr. Mariah Milling. ? ?Pt to arrive at [facility]) tomorrow at 06:30 am for registration and procedure should start around 07:30 am with Dr. Mariah Milling.  Reviewed instructions with pt as discussed yesterday at her OV. ? ?Otherwise all questions were address and no additional concerns at this time. Robin Ortiz thankful for the return call and advice. Agreeable to plan, will call back for anything further.   ? ? ?Testing/Procedures: ?Cardioversion for atrial fibrillation ?                                                Cardioversion ? ?Date: 12/14/2021 Arrive at : 06:30 am Start time: 07:30 am ?  ?Please arrive at the [facility], free valet parking is available  ?[address], [place], [place] [address] ?  ?INSTRUCTIONS:  ?  ?Labs: CBC & BMP (completed yesterday) ?  ?Nothing to eat or drink after midnight  ?May take your night time medications with a small sip of  water                                  ?  ?Medications:  ?HOLD: Lasix the morning of procedure (can take after) ?YOU MAY TAKE ALL of your remaining medications with a small amount of water. ?Continue your anticoagulant: Eliquis ?You will need to continue your anticoagulant after your procedure until you are told by your provider that it is safe to stop ?  ?Must have a responsible person to drive you home. ?They must stay in the waiting area during your procedure.  ?Failure to do so could result in cancellation. ?  ?Bring a current list of your medications and current insurance cards.      ? ? ?

## 2021-12-14 ENCOUNTER — Ambulatory Visit: Payer: Medicare Other | Admitting: Certified Registered Nurse Anesthetist

## 2021-12-14 ENCOUNTER — Encounter
Admission: RE | Disposition: A | Discharge status: Home / Self Care | Payer: Self-pay | Source: Home / Self Care | Attending: Cardiovascular Disease

## 2021-12-14 ENCOUNTER — Ambulatory Visit
Admission: RE | Admit: 2021-12-14 | Discharge: 2021-12-14 | Disposition: A | Discharge status: Home / Self Care | Payer: Medicare Other | Attending: Cardiovascular Disease | Admitting: Cardiovascular Disease

## 2021-12-14 ENCOUNTER — Encounter: Payer: Self-pay | Admitting: Cardiovascular Disease

## 2021-12-14 DIAGNOSIS — I4892 Unspecified atrial flutter: Secondary | ICD-10-CM | POA: Insufficient documentation

## 2021-12-14 DIAGNOSIS — I4819 Other persistent atrial fibrillation: Secondary | ICD-10-CM | POA: Insufficient documentation

## 2021-12-14 SURGERY — CARDIOVERSION
Anesthesia: General

## 2021-12-14 MED ORDER — PROPOFOL 10 MG/ML IV BOLUS
INTRAVENOUS | Status: DC | PRN
  Administered 2021-12-14: 40 mg via INTRAVENOUS

## 2021-12-14 MED ORDER — FUROSEMIDE 10 MG/ML IJ SOLN
40.0000 mg | Freq: Once | INTRAMUSCULAR | Status: AC
Start: 2021-12-14 — End: 2021-12-14

## 2021-12-14 MED ORDER — PROPOFOL 10 MG/ML IV BOLUS
INTRAVENOUS | Status: AC
  Filled 2021-12-14: qty 40

## 2021-12-14 MED ORDER — FUROSEMIDE 10 MG/ML IJ SOLN
INTRAMUSCULAR | Status: AC
  Administered 2021-12-14: 40 mg via INTRAVENOUS
  Filled 2021-12-14: qty 4

## 2021-12-14 NOTE — Transfer of Care (Signed)
Immediate Anesthesia Transfer of Care Note ? ?Patient: Robin Ortiz ? ?Procedure(s) Performed: CARDIOVERSION ? ?Patient Location: PACU ? ?Anesthesia Type:General ? ?Level of Consciousness: sedated ? ?Airway & Oxygen Therapy: Patient Spontanous Breathing ? ?Post-op Assessment: Report given to RN and Post -op Vital signs reviewed and stable ? ?Post vital signs: Reviewed and stable ? ?Last Vitals:  ?Vitals Value Taken Time  ?BP 130/72 12/14/21 0737  ?Temp    ?Pulse 58 12/14/21 0737  ?Resp 27 12/14/21 0737  ?SpO2 93 % 12/14/21 0737  ?Vitals shown include unvalidated device data. ? ?Last Pain:  ?Vitals:  ? 12/14/21 0737  ?TempSrc:   ?PainSc: 0-No pain  ?   ? ?  ? ?Complications: No notable events documented. ?

## 2021-12-14 NOTE — CV Procedure (Signed)
Cardioversion procedure note ?For atrial fibrillation/flutter, persistent ? ?Procedure Details: ? ?Consent: Risks of procedure as well as the alternatives and risks of each were explained to the (patient/caregiver).  Consent for procedure obtained. ? ?Time Out: Verified patient identification, verified procedure, site/side was marked, verified correct patient position, special equipment/implants available, medications/allergies/relevent history reviewed, required imaging and test results available.  Performed ? ?Patient placed on cardiac monitor, pulse oximetry, supplemental oxygen as necessary.   ?Sedation given: propofol IV, Dr. Pernell Dupre ?Pacer pads placed anterior and posterior chest. ? ? ?Cardioverted 1 time(s).   ?Cardioverted at  120 J. Synchronized biphasic ?Converted to NSR ? ? ?Evaluation: ?Findings: Post procedure EKG shows: NSR ?Complications: None ?Patient did tolerate procedure well. ? ?Time Spent Directly with the Patient: ? ?45 minutes  ? ?Dossie Arbour, M.D., Ph.D.  ?

## 2021-12-15 ENCOUNTER — Telehealth: Payer: Self-pay | Admitting: Cardiovascular Disease

## 2021-12-15 NOTE — Telephone Encounter (Signed)
Pt c/o Shortness Of Breath: STAT if SOB developed within the last 24 hours or pt is noticeably SOB on the phone  1. Are you currently SOB (can you hear that pt is SOB on the phone)? yes  2. How long have you been experiencing SOB? Patient was cardioverted on 3/16, patient is still SOB  3. Are you SOB when sitting or when up moving around? Moving around.   4. Are you currently experiencing any other symptoms? Left side of ribs are sore, patient states they hurt before.   Patient is scheduled for 3/20 with Dr. Rockey Situ

## 2021-12-15 NOTE — Telephone Encounter (Signed)
Spoke with pt's daughter.  ?Pt s/p cardioversion yesterday 3/16 with Dr. Mariah Milling.  ?Pt c/o shortness of breath with minimal exertion which also present prior to dccv.  ? ?Had dtr check BP/HR while on phone: BP 151/93 HR 65 ?Dtr states pt's HR has remained approx 60s-70s since dccv.  ?Pt continues Lasix 20 mg BID that was incr at discharge yesterday.  ?Does not note incr urine output.  ? ?Pt denies chest pain.  ?Does note pain on left side "near ribs/waist area".  ?States this pain was present also prior to dccv.  ? ?Pt has been resting with legs elevated since she has been home.  ?Denies SOB at rest.  ? ?Advised pt continue to monitor vitals and continue to take it easy while at home.  ?Pt has been scheduled to see Dr. Mariah Milling Monday 3/20 to f/u SOB.  ? ?Advised if symptoms worsen or pt becomes unstable to go to emergency room.  ?Otherwise we will see pt in office Monday. Dtr voiced understanding and has no further questions.  ?

## 2021-12-17 NOTE — Progress Notes (Signed)
Cardiology Office Note ? ?Date:  12/18/2021  ? ?ID:  Robin Ortiz, DOB 01/16/1935, MRN [id number] ? ?PCP:  Albina Billet, MD  ? ?Chief Complaint  ?Patient presents with  ? Shortness of Breath  ?  Patient c/o shortness of breath with walking. Medications reviewed by the patient verbally.   ? ? ? ?HPI:  ?[name] is a pleasant 87 year-old woman with a history of  ?hypertension,  ?hyperlipidemia,  ?remote history of nonischemic cardiomyopathy with initial ejection fraction 20-30% in 2002 that has improved to 50-55% on echocardiogram in March 2009,  ?status post bilateral knee replacement  ?Anxiety/gets stressed, chronic fatigue ? CHA2DS2-VASc of 5. ?who presents for routine follow up of her cardiomyopathy. ? ?Presents today with both daughters from [place] ?Underwent recent cardioversion to restore normal sinus rhythm ?Reports that she feels better, breathing improved ?Heart rate running slow 50 bpm ?Labile blood pressure but has been checking it before morning medications ?Still with leg swelling, pitting edema ?Has been taking Lasix daily ? ?Family would like repeat chest x-ray ?Lab work reviewed, climbing BUN and creatinine 6 days ago ? ?Last seen in clinic November 27, 2021 ?Started amiodarone 400 twice daily at that time ? ?EKG personally reviewed by myself on todays visit ?Shows sinus bradycardia rate 50 bpm nonspecific ST-T wave abnormality ? ?Echo reviewed ? 1. Left ventricular ejection fraction, by estimation, is 35 to 40%. The  ?left ventricle has moderately decreased function. The left ventricle has  ?no regional wall motion abnormalities. Left ventricular diastolic  ?parameters are indeterminate.  ? 2. Right ventricular systolic function is low normal. The right  ?ventricular size is normal.  ? 3. Left atrial size was severely dilated.  ? 4. Right atrial size was mildly dilated.  ? 5. The mitral valve is normal in structure. Moderate mitral valve  ?regurgitation.  ? ?Seen by one of our providers  October 17, 2021, in atrial fibrillation ? ?Prior echocardiogram history ?EF previous as low as 20 to 30% in 2002  ?45 to 50% in December 2016.  ?Echo 10/18/2021 ef 35 to 40% ?Moderate mitral valve  regurgitation.  ?Left atrial size was severely dilated.  ?Tricuspid valve regurgitation is mild to moderate. ? ? 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 ? ?PSH:    ?Past Surgical History:  ?Procedure Laterality Date  ? CARDIOVERSION N/A 12/14/2021  ? Procedure: CARDIOVERSION;  Surgeon: Minna Merritts, MD;  Location: [facility] ORS;  Service: Cardiovascular;  Laterality: N/A;  ? ROTATOR CUFF REPAIR    ? left shoulder  ? ruptured tendon right foot Right   ? surgical repair  ? TOTAL KNEE ARTHROPLASTY Bilateral   ? ? ?Current Outpatient Medications  ?Medication Sig Dispense Refill  ? amiodarone (PACERONE) 200 MG tablet Take 1 tablet (200 mg total) by mouth 2 (two) times daily.    ? apixaban (ELIQUIS) 5 MG TABS tablet Take 1 tablet (5 mg total) by mouth 2 (two) times daily. 60 tablet 6  ? Calcium-Vitamin D 600-200 MG-UNIT per tablet Take 1 tablet by mouth daily.    ? carvedilol (COREG) 25 MG tablet Take 1 tablet (25 mg total) by mouth 2 (two) times daily with a meal. 180 tablet 3  ? celecoxib (CELEBREX) 200 MG capsule Take 200 mg by mouth daily as needed for mild pain.    ?  diclofenac Sodium (VOLTAREN) 1 % GEL Apply 2 g topically daily as needed (Arthritis).    ? diphenhydrAMINE-APAP, sleep, (TYLENOL PM EXTRA STRENGTH PO) Take 1 tablet by mouth at bedtime as needed (sleep).    ? furosemide (LASIX) 20 MG tablet Take 1 tablet (20 mg total) by mouth every Monday, Wednesday, and Friday. (Patient taking differently: Take 20 mg by mouth daily.) 39 tablet 3  ? losartan (COZAAR) 100 MG tablet  Take 1 tablet (100 mg total) by mouth daily. 90 tablet 3  ? Misc Natural Products (OSTEO BI-FLEX ADV JOINT SHIELD PO) Take 1 tablet by mouth daily.    ? Multiple Vitamin (MULTIVITAMIN) tablet Take 1 tablet by mouth daily. Alive    ? potassium chloride (KLOR-CON) 10 MEQ tablet Take 1 tablet (10 mEq total) by mouth every Monday, Wednesday, and Friday. (Patient taking differently: Take 10 mEq by mouth daily.) 39 tablet 3  ? raloxifene (EVISTA) 60 MG tablet Take 60 mg by mouth daily.    ? simvastatin (ZOCOR) 20 MG tablet Take 20 mg by mouth at bedtime.    ? ?No current facility-administered medications for this visit.  ? ? ?Allergies:   Patient has no known allergies.  ? ?Social History:  The patient  reports that she has never smoked. She has never used smokeless tobacco. She reports current alcohol use of about 2.0 - 3.0 standard drinks per week. She reports that she does not use drugs.  ? ?Family History:   family history includes Breast cancer in her cousin; Breast cancer (age of onset: 33) in her maternal aunt; Breast cancer (age of onset: 51) in her maternal grandmother; Heart disease in her father.  ? ? ?Review of Systems: ?Review of Systems  ?Constitutional:  Positive for malaise/fatigue.  ?HENT: Negative.    ?Respiratory: Negative.    ?Cardiovascular: Negative.   ?Gastrointestinal: Negative.   ?Musculoskeletal: Negative.   ?Neurological: Negative.   ?All other systems reviewed and are negative. ? ?PHYSICAL EXAM: ?VS:  BP 130/70 (BP Location: Left Arm, Patient Position: Sitting, Cuff Size: Normal)   Pulse (!) 50   Ht 5' 5.5" (1.664 m)   Wt 156 lb (70.8 kg)   SpO2 98%   BMI 25.56 kg/m?  , BMI Body mass index is 25.56 kg/m?Marland Kitchen  ?Constitutional:  oriented to person, place, and time. No distress.  ?HENT:  ?Head: Grossly normal ?Eyes:  no discharge. No scleral icterus.  ?Neck: No JVD, no carotid bruits  ?Cardiovascular: Regular rate and rhythm, no murmurs appreciated ?Pulmonary/Chest: Clear to auscultation  bilaterally, no wheezes or rails ?Abdominal: Soft.  no distension.  no tenderness.  ?Musculoskeletal: Normal range of motion ?Neurological:  normal muscle tone. Coordination normal. No atrophy ?Skin: Skin warm and dry ?Psychiatric: normal affect, pleasant ? ? ?Recent Labs: ?10/17/2021: Magnesium 2.2; TSH 1.850 ?12/12/2021: BUN 28; Creatinine, Ser 1.35; Hemoglobin 11.8; Platelets 242; Potassium 4.2; Sodium 137  ? ? ?Lipid Panel ?No results found for: CHOL, HDL, LDLCALC, TRIG ?  ? ?Wt Readings from Last 3 Encounters:  ?12/18/21 156 lb (70.8 kg)  ?12/14/21 148 lb (67.1 kg)  ?12/12/21 158 lb (71.7 kg)  ?  ? ?ASSESSMENT AND PLAN: ? ?Persistent atrial fibrillation ?Recent successful cardioversion, maintaining normal sinus rhythm ?Given bradycardia we will decrease amiodarone down to 200 mg daily ?Decrease Coreg back to 12.5 twice daily ?Continue anticoagulation, Eliquis ? ?Chronic diastolic and systolic CHF ?Still with pitting lower extremity edema bilaterally 1+, ?Recommend she continue Lasix daily with potassium ?We have ordered lab  work for tomorrow given climbing BUN and creatinine last week ?Compression hose recommended, leg elevation, get more active ? ?Mixed hyperlipidemia ?Continue statin ? ?Essential hypertension ?Decrease Coreg as above, recommend she restart her amlodipine, ?Continue losartan ? ?Chronic fatigue/anxiety ?Exacerbated by atrial fibrillation ? ?Anxiety ?Less anxious on today's visit ? ?Cardiomyopathy ?Long history of cardiomyopathy dating back to 2002 ?Exacerbation in function in the setting of atrial fibrillation ?Feeling better on today's visit ? ? Total encounter time more than 40  minutes ? Greater than 50% was spent in counseling and coordination of care with the patient ? ? ? ?Orders Placed This Encounter  ?Procedures  ? EKG 12-Lead  ? ? ? ?Signed, ?Esmond Plants, M.D., Ph.D. ?12/18/2021  ?[place] ?[phone] ? ?

## 2021-12-18 ENCOUNTER — Encounter: Payer: Self-pay | Admitting: Cardiovascular Disease

## 2021-12-18 ENCOUNTER — Ambulatory Visit (INDEPENDENT_AMBULATORY_CARE_PROVIDER_SITE_OTHER): Payer: Medicare Other | Admitting: Cardiovascular Disease

## 2021-12-18 ENCOUNTER — Other Ambulatory Visit: Payer: Self-pay

## 2021-12-18 VITALS — BP 130/70 | HR 50 | Ht 65.5 in | Wt 156.0 lb

## 2021-12-18 DIAGNOSIS — R0602 Shortness of breath: Secondary | ICD-10-CM

## 2021-12-18 DIAGNOSIS — J810 Acute pulmonary edema: Secondary | ICD-10-CM

## 2021-12-18 DIAGNOSIS — I4819 Other persistent atrial fibrillation: Secondary | ICD-10-CM

## 2021-12-18 DIAGNOSIS — I10 Essential (primary) hypertension: Secondary | ICD-10-CM

## 2021-12-18 DIAGNOSIS — I428 Other cardiomyopathies: Secondary | ICD-10-CM | POA: Diagnosis not present

## 2021-12-18 DIAGNOSIS — E782 Mixed hyperlipidemia: Secondary | ICD-10-CM

## 2021-12-18 DIAGNOSIS — Z79899 Other long term (current) drug therapy: Secondary | ICD-10-CM

## 2021-12-18 DIAGNOSIS — I5022 Chronic systolic (congestive) heart failure: Secondary | ICD-10-CM

## 2021-12-18 MED ORDER — AMLODIPINE BESYLATE 5 MG PO TABS: 5.0000 mg | ORAL_TABLET | Freq: Every day | ORAL | 3 refills | Status: DC

## 2021-12-18 MED ORDER — CARVEDILOL 12.5 MG PO TABS: 12.5000 mg | ORAL_TABLET | Freq: Two times a day (BID) | ORAL | 3 refills | Status: DC

## 2021-12-18 MED ORDER — AMIODARONE HCL 200 MG PO TABS: 200.0000 mg | ORAL_TABLET | Freq: Every day | ORAL | 3 refills | Status: DC

## 2021-12-18 NOTE — Patient Instructions (Addendum)
CMP  in hospital tomorrow ? ?Please call with blood pressure ? ?Medication Instructions:  ?Please decrease the amiodarone down to 200 mg daily ?Decrease the coreg down to 12.5 mg twice a day ? ?Please restart amlodipine 5 mg daily ? ?Please take lasix 20 mg daily with potassium ? ?CXR for pulmonary edema ? ?If you need a refill on your cardiac medications before your next appointment, please call your pharmacy.  ? ?Lab work: ?No new labs needed ? ?Testing/Procedures: ?No new testing needed ? ?Follow-Up: ?At [facility], you and your health needs are our priority.  As part of our continuing mission to provide you with exceptional heart care, we have created designated Provider Care Teams.  These Care Teams include your primary Cardiologist (physician) and Advanced Practice Providers (APPs -  Physician Assistants and Nurse Practitioners) who all work together to provide you with the care you need, when you need it. ? ?You will need a follow up appointment in 1 month ? ?Providers on your designated Care Team:   ?Murray Hodgkins, NP ?Christell Faith, PA-C ?Cadence Kathlen Mody, PA-C ? ?COVID-19 Vaccine Information can be found at: ShippingScam.co.uk For questions related to vaccine distribution or appointments, please email [email or web address] or call [phone].  ? ?

## 2021-12-19 ENCOUNTER — Other Ambulatory Visit
Admission: RE | Admit: 2021-12-19 | Discharge: 2021-12-19 | Disposition: A | Discharge status: Home / Self Care | Payer: Medicare Other | Source: Ambulatory Visit | Attending: Cardiovascular Disease | Admitting: Cardiovascular Disease

## 2021-12-19 ENCOUNTER — Ambulatory Visit
Admission: RE | Admit: 2021-12-19 | Discharge: 2021-12-19 | Disposition: A | Discharge status: Home / Self Care | Payer: Medicare Other | Source: Ambulatory Visit | Attending: Cardiovascular Disease | Admitting: Cardiovascular Disease

## 2021-12-19 ENCOUNTER — Telehealth: Payer: Self-pay | Admitting: Cardiovascular Disease

## 2021-12-19 DIAGNOSIS — J810 Acute pulmonary edema: Secondary | ICD-10-CM | POA: Diagnosis present

## 2021-12-19 DIAGNOSIS — Z79899 Other long term (current) drug therapy: Secondary | ICD-10-CM

## 2021-12-19 LAB — COMPREHENSIVE METABOLIC PANEL
ALT: 22 U/L (ref 0–44)
AST: 17 U/L (ref 15–41)
Albumin: 3.5 g/dL (ref 3.5–5.0)
Alkaline Phosphatase: 48 U/L (ref 38–126)
Anion gap: 9 (ref 5–15)
BUN: 21 mg/dL (ref 8–23)
CO2: 28 mmol/L (ref 22–32)
Calcium: 9.5 mg/dL (ref 8.9–10.3)
Chloride: 104 mmol/L (ref 98–111)
Creatinine, Ser: 1.34 mg/dL — ABNORMAL HIGH (ref 0.44–1.00)
GFR, Estimated: 39 mL/min — ABNORMAL LOW (ref >60)
Glucose, Bld: 97 mg/dL (ref 70–99)
Potassium: 3.6 mmol/L (ref 3.5–5.1)
Sodium: 141 mmol/L (ref 135–145)
Total Bilirubin: 0.9 mg/dL (ref 0.3–1.2)
Total Protein: 6.4 g/dL — ABNORMAL LOW (ref 6.5–8.1)

## 2021-12-19 NOTE — Addendum Note (Signed)
Addended by: Maple Hudson on: 12/19/2021 10:41 AM ? ? Modules accepted: Orders ? ?

## 2021-12-19 NOTE — Telephone Encounter (Signed)
Responded to pt via MyChart ? ?Dr. Rockey Situ reviewed, BP "good" ?Advised to monitor for at least 2 weeks, then call in or upload to [facility] for review.  ?

## 2021-12-19 NOTE — Telephone Encounter (Signed)
Pt c/o BP issue: STAT if pt c/o blurred vision, one-sided weakness or slurred speech ? ?1. What are your last 5 BP readings? 0830 today 1 hr after meds 130/62 HR 55  ? ?2. Are you having any other symptoms (ex. Dizziness, headache, blurred vision, passed out)? Per ov call today with bp  ? ?3. What is your BP issue? See above  ?

## 2021-12-20 NOTE — Anesthesia Preprocedure Evaluation (Signed)
Anesthesia Evaluation  ?Patient identified by MRN, date of birth, ID band ?Patient awake ? ? ? ?Reviewed: ?Allergy & Precautions, H&P , NPO status , Patient's Chart, lab work & pertinent test results, reviewed documented beta blocker date and time  ? ?Airway ?Mallampati: II ? ? ?Neck ROM: full ? ? ? Dental ? ?(+) Poor Dentition ?  ?Pulmonary ?neg pulmonary ROS,  ?  ?Pulmonary exam normal ? ? ? ? ? ? ? Cardiovascular ?Exercise Tolerance: Poor ?hypertension, On Medications ?negative cardio ROS ? ?Atrial Fibrillation  ?Rhythm:regular Rate:Normal ? ? ?  ?Neuro/Psych ?Anxiety negative neurological ROS ? negative psych ROS  ? GI/Hepatic ?negative GI ROS, Neg liver ROS,   ?Endo/Other  ?negative endocrine ROS ? Renal/GU ?negative Renal ROS  ?negative genitourinary ?  ?Musculoskeletal ? ? Abdominal ?  ?Peds ? Hematology ?negative hematology ROS ?(+)   ?Anesthesia Other Findings ?Past Medical History: ?No date: Anxiety ?No date: Cardiomegaly ?No date: Chronic fatigue ?No date: Chronic HFmrEF (heart failure with midrange ejection  ?fraction) ([facility]) ?    Comment:  a. 2002 Echo: EF 20-30%; b. 09/2015 Echo: EF 45-50%,  ?             diff HK. Mild MR. Mod dil LA. ?No date: Essential hypertension ?No date: Mixed hyperlipidemia ?No date: NICM (nonischemic cardiomyopathy) ([facility]) ?No date: Osteoporosis ?No date: Persistent atrial fibrillation ([facility]) ?    Comment:  a. Dx 10/2021. CHA2DS2VASc = 5-->Eliquis. ?Past Surgical History: ?12/14/2021: CARDIOVERSION; N/A ?    Comment:  Procedure: CARDIOVERSION;  Surgeon: Antonieta Iba,  ?             MD;  Location: [facility] ORS;  Service: Cardiovascular;   ?             Laterality: N/A; ?No date: ROTATOR CUFF REPAIR ?    Comment:  left shoulder ?No date: ruptured tendon right foot; Right ?    Comment:  surgical repair ?No date: TOTAL KNEE ARTHROPLASTY; Bilateral ?BMI   ? Body Mass Index: 24.63 kg/m?  ?  ? Reproductive/Obstetrics ?negative OB ROS ? ?   ? ? ? ? ? ? ? ? ? ? ? ? ? ?  ?  ? ? ? ? ? ? ? ? ?Anesthesia Physical ?Anesthesia Plan ? ?ASA: 4 ? ?Anesthesia Plan: General  ? ?Post-op Pain Management:   ? ?Induction:  ? ?PONV Risk Score and Plan:  ? ?Airway Management Planned:  ? ?Additional Equipment:  ? ?Intra-op Plan:  ? ?Post-operative Plan:  ? ?Informed Consent: I have reviewed the patients History and Physical, chart, labs and discussed the procedure including the risks, benefits and alternatives for the proposed anesthesia with the patient or authorized representative who has indicated his/her understanding and acceptance.  ? ? ? ?Dental Advisory Given ? ?Plan Discussed with: CRNA ? ?Anesthesia Plan Comments:   ? ? ? ? ? ? ?Anesthesia Quick Evaluation ? ?

## 2021-12-20 NOTE — Anesthesia Postprocedure Evaluation (Signed)
Anesthesia Post Note ? ?Patient: Robin Ortiz ? ?Procedure(s) Performed: CARDIOVERSION ? ?Patient location during evaluation: PACU ?Anesthesia Type: General ?Level of consciousness: awake and alert ?Pain management: pain level controlled ?Vital Signs Assessment: post-procedure vital signs reviewed and stable ?Respiratory status: spontaneous breathing, nonlabored ventilation, respiratory function stable and patient connected to nasal cannula oxygen ?Cardiovascular status: blood pressure returned to baseline and stable ?Postop Assessment: no apparent nausea or vomiting ?Anesthetic complications: no ? ? ?No notable events documented. ? ? ?Last Vitals:  ?Vitals:  ? 12/14/21 0830 12/14/21 0845  ?BP: 136/86 (!) 138/113  ?Pulse:    ?Resp: 13 18  ?Temp:    ?SpO2:    ?  ?Last Pain:  ?Vitals:  ? 12/14/21 0845  ?TempSrc:   ?PainSc: 0-No pain  ? ? ?  ?  ?  ?  ?  ?  ? ?Yevette Edwards ? ? ? ? ?

## 2021-12-25 MED ORDER — FUROSEMIDE 40 MG PO TABS
40.0000 mg | ORAL_TABLET | Freq: Every day | ORAL | 3 refills | Status: DC
Start: 2021-12-25 — End: 2022-01-12

## 2022-01-09 ENCOUNTER — Encounter: Payer: Self-pay | Admitting: Cardiovascular Disease

## 2022-01-10 ENCOUNTER — Encounter: Payer: Self-pay | Admitting: Cardiovascular Disease

## 2022-01-11 ENCOUNTER — Encounter: Payer: Self-pay | Admitting: Nurse Practitioner

## 2022-01-11 ENCOUNTER — Ambulatory Visit (INDEPENDENT_AMBULATORY_CARE_PROVIDER_SITE_OTHER): Payer: Medicare Other | Admitting: Nurse Practitioner

## 2022-01-11 ENCOUNTER — Other Ambulatory Visit
Admission: RE | Admit: 2022-01-11 | Discharge: 2022-01-11 | Disposition: A | Discharge status: Home / Self Care | Payer: Medicare Other | Source: Home / Self Care | Attending: Nurse Practitioner | Admitting: Nurse Practitioner

## 2022-01-11 ENCOUNTER — Ambulatory Visit
Admission: RE | Admit: 2022-01-11 | Discharge: 2022-01-11 | Disposition: A | Discharge status: Home / Self Care | Payer: Medicare Other | Attending: Nurse Practitioner | Admitting: Nurse Practitioner

## 2022-01-11 ENCOUNTER — Ambulatory Visit
Admission: RE | Admit: 2022-01-11 | Discharge: 2022-01-11 | Disposition: A | Discharge status: Home / Self Care | Payer: Medicare Other | Source: Ambulatory Visit | Attending: Nurse Practitioner | Admitting: Nurse Practitioner

## 2022-01-11 VITALS — BP 146/64 | HR 50 | Ht 65.5 in | Wt 145.0 lb

## 2022-01-11 DIAGNOSIS — I5022 Chronic systolic (congestive) heart failure: Secondary | ICD-10-CM

## 2022-01-11 DIAGNOSIS — I4819 Other persistent atrial fibrillation: Secondary | ICD-10-CM

## 2022-01-11 DIAGNOSIS — R5383 Other fatigue: Secondary | ICD-10-CM

## 2022-01-11 DIAGNOSIS — I10 Essential (primary) hypertension: Secondary | ICD-10-CM | POA: Diagnosis not present

## 2022-01-11 DIAGNOSIS — I428 Other cardiomyopathies: Secondary | ICD-10-CM | POA: Diagnosis present

## 2022-01-11 DIAGNOSIS — R001 Bradycardia, unspecified: Secondary | ICD-10-CM

## 2022-01-11 DIAGNOSIS — E782 Mixed hyperlipidemia: Secondary | ICD-10-CM

## 2022-01-11 LAB — BASIC METABOLIC PANEL
Anion gap: 6 (ref 5–15)
BUN: 27 mg/dL — ABNORMAL HIGH (ref 8–23)
CO2: 28 mmol/L (ref 22–32)
Calcium: 9.7 mg/dL (ref 8.9–10.3)
Chloride: 108 mmol/L (ref 98–111)
Creatinine, Ser: 1.13 mg/dL — ABNORMAL HIGH (ref 0.44–1.00)
GFR, Estimated: 48 mL/min — ABNORMAL LOW (ref >60)
Glucose, Bld: 117 mg/dL — ABNORMAL HIGH (ref 70–99)
Potassium: 4.2 mmol/L (ref 3.5–5.1)
Sodium: 142 mmol/L (ref 135–145)

## 2022-01-11 MED ORDER — CARVEDILOL 6.25 MG PO TABS: 6.2500 mg | ORAL_TABLET | Freq: Two times a day (BID) | ORAL | 3 refills | Status: DC

## 2022-01-11 NOTE — Progress Notes (Signed)
? ? ?Office Visit  ?  ?Patient Name: Robin Ortiz ?Date of Encounter: 01/11/2022 ? ?Primary Care Provider:  Jaclyn Shaggy, MD ?Primary Cardiologist:  Julien Nordmann, MD ? ?Chief Complaint  ?  ?87 year old female with history of nonischemic cardiomyopathy, chronic heart failure with midrange ejection fraction, hypertension, hyperlipidemia, anxiety, and chronic fatigue, who presents for follow-up after recent cardioversion. ? ?Past Medical History  ?  ?Past Medical History:  ?Diagnosis Date  ? Anxiety   ? Cardiomegaly   ? Chronic fatigue   ? Chronic HFmrEF (heart failure with midrange ejection fraction) (HCC)   ? a. 2002 Echo: EF 20-30%; b. 09/2015 Echo: EF 45-50%, diff HK. Mild MR. Mod dil LA; c. 10/2021 Echo: EF 35-40% (in setting of afib), no rwma, low-nl RV fxn, sev dil LA, mildly dil RA, mod MR, mild-mod TR.  ? Essential hypertension   ? Mixed hyperlipidemia   ? NICM (nonischemic cardiomyopathy) (HCC)   ? Osteoporosis   ? Persistent atrial fibrillation (HCC)   ? a. Dx 10/2021. CHA2DS2VASc = 5-->Eliquis; b. 11/2021 s/p DCCV (120J).  ? ?Past Surgical History:  ?Procedure Laterality Date  ? CARDIOVERSION N/A 12/14/2021  ? Procedure: CARDIOVERSION;  Surgeon: Antonieta Iba, MD;  Location: [facility] ORS;  Service: Cardiovascular;  Laterality: N/A;  ? ROTATOR CUFF REPAIR    ? left shoulder  ? ruptured tendon right foot Right   ? surgical repair  ? TOTAL KNEE ARTHROPLASTY Bilateral   ? ? ?Allergies ? ?No Known Allergies ? ?History of Present Illness  ?  ?87 year old female with above past medical history including nonischemic cardiomyopathy, chronic heart failure with midrange ejection fraction, hypertension, hyperlipidemia, anxiety, and chronic fatigue.  She was previously diagnosed with nonischemic cardiomyopathy in 2002, at which time her EF was 20 to 30%.  Echo in December 2016 showed improvement to 45 to 50% with diffuse hypokinesis and mild mitral regurgitation.  In January 2023, she was seen in cardiology clinic  with worsening dyspnea, fatigue, and palpitations.  She was noted to be in atrial fibrillation at 108 bpm.  She was placed on Eliquis 5 mg twice daily (CHA2DS2-VASc equals 5), and carvedilol was increased to 18.75 mg twice daily.  Lab work was unremarkable.  Follow-up echo showed an EF of 35 to 40%.  She remained in atrial fibrillation at follow-up on February 27, and was placed on amiodarone.  She remained in A-fib at follow-up on March 14 of subsequently sent for cardioversion, which was performed on March 16 and was successful in converting her to sinus rhythm. ? ?She was seen in follow-up on March 20 with complaints of ongoing dyspnea and lower extremity edema.  Chest x-ray was also performed and showed mild CHF.  Lasix was increased to 40 mg daily.  In the setting of bradycardia, carvedilol was reduced back to 12.5 mg twice daily.  Since then, she has noted good response and resolution of lower extremity edema and dyspnea.  Her weight is also been down on her home scale and her clothes are fitting more loosely.  Unfortunately, she continues to experience fatigue and generalized lack of energy that occurs 3 to 4 hours after awakening.  She has been monitoring her heart rate and blood pressure and contacted our office on April 11 in the setting of intermittent hypotension with pressures dropping into the high 70s and 80s, as well as ongoing bradycardia with heart rates in the mid 40s.  She was advised to hold her amlodipine and follow-up was arranged  for today.  She did not take her amlodipine today and her pressure is 146/64.  Heart rate is 50.  Other than fatigue, she has been feeling well.  She denies chest pain, dyspnea, palpitations, PND, orthopnea, dizziness, syncope, edema, or early satiety.  Her family is interested in getting her into cardiac rehabilitation. ? ?Home Medications  ?  ?Current Outpatient Medications  ?Medication Sig Dispense Refill  ? amiodarone (PACERONE) 200 MG tablet Take 1 tablet (200  mg total) by mouth daily. 90 tablet 3  ? apixaban (ELIQUIS) 5 MG TABS tablet Take 1 tablet (5 mg total) by mouth 2 (two) times daily. 60 tablet 6  ? Calcium-Vitamin D 600-200 MG-UNIT per tablet Take 1 tablet by mouth daily.    ? carvedilol (COREG) 12.5 MG tablet Take 1 tablet (12.5 mg total) by mouth 2 (two) times daily with a meal. 180 tablet 3  ? celecoxib (CELEBREX) 200 MG capsule Take 200 mg by mouth daily as needed for mild pain.    ? diclofenac Sodium (VOLTAREN) 1 % GEL Apply 2 g topically daily as needed (Arthritis).    ? diphenhydrAMINE-APAP, sleep, (TYLENOL PM EXTRA STRENGTH PO) Take 1 tablet by mouth at bedtime as needed (sleep).    ? losartan (COZAAR) 100 MG tablet Take 1 tablet (100 mg total) by mouth daily. 90 tablet 3  ? Misc Natural Products (OSTEO BI-FLEX ADV JOINT SHIELD PO) Take 1 tablet by mouth daily.    ? Multiple Vitamin (MULTIVITAMIN) tablet Take 1 tablet by mouth daily. Alive    ? potassium chloride (KLOR-CON) 10 MEQ tablet Take 1 tablet (10 mEq total) by mouth every Monday, Wednesday, and Friday. (Patient taking differently: Take 10 mEq by mouth daily.) 39 tablet 3  ? raloxifene (EVISTA) 60 MG tablet Take 60 mg by mouth daily.    ? simvastatin (ZOCOR) 20 MG tablet Take 20 mg by mouth at bedtime.    ? amLODipine (NORVASC) 5 MG tablet Take 1 tablet (5 mg total) by mouth daily. (Patient not taking: Reported on 01/11/2022) 90 tablet 3  ? furosemide (LASIX) 40 MG tablet Take 1 tablet (40 mg total) by mouth daily. (Patient not taking: Reported on 01/11/2022) 30 tablet 3  ? ?No current facility-administered medications for this visit.  ?  ? ?Review of Systems  ?  ?Ongoing fatigue.  She denies chest pain, dyspnea, palpitations, PND, orthopnea, dizziness, syncope, edema, or early satiety.  All other systems reviewed and are otherwise negative except as noted above. ?  ? ?Physical Exam  ?  ?VS:  BP (!) 146/64 (BP Location: Left Arm, Patient Position: Sitting, Cuff Size: Normal)   Pulse (!) 50   Ht 5'  5.5" (1.664 m)   Wt 145 lb (65.8 kg)   SpO2 97%   BMI 23.76 kg/m?  , BMI Body mass index is 23.76 kg/m?. ?    ?GEN: Well nourished, well developed, in no acute distress. ?HEENT: normal. ?Neck: Supple, no JVD, carotid bruits, or masses. ?Cardiac: RRR, 2/6 systolic murmur at the apex, no rubs, or gallops. No clubbing, cyanosis, edema.  Radials 2+/PT 2+ and equal bilaterally.  ?Respiratory:  Respirations regular and unlabored, clear to auscultation bilaterally. ?GI: Soft, nontender, nondistended, BS + x 4. ?MS: no deformity or atrophy. ?Skin: warm and dry, no rash. ?Neuro:  Strength and sensation are intact. ?Psych: Normal affect. ? ?Accessory Clinical Findings  ?  ?ECG personally reviewed by me today -sinus bradycardia, 50, septal infarct, nonspecific T changes- no acute changes. ? ?  Lab Results  ?Component Value Date  ? WBC 8.5 12/12/2021  ? HGB 11.8 (L) 12/12/2021  ? HCT 38.1 12/12/2021  ? MCV 84.7 12/12/2021  ? PLT 242 12/12/2021  ? ?Lab Results  ?Component Value Date  ? CREATININE 1.34 (H) 12/19/2021  ? BUN 21 12/19/2021  ? NA 141 12/19/2021  ? K 3.6 12/19/2021  ? CL 104 12/19/2021  ? CO2 28 12/19/2021  ? ?Lab Results  ?Component Value Date  ? ALT 22 12/19/2021  ? AST 17 12/19/2021  ? ALKPHOS 48 12/19/2021  ? BILITOT 0.9 12/19/2021  ? ?Assessment & Plan  ?  ?1.  Persistent atrial fibrillation/Sinus bradycardia: Status post successful cardioversion on March 16.  She has been having sinus bradycardia since cardioversion with rates in the 40s at home.  She is 50 today.  She has also had fatigue.  I am reducing her carvedilol to 6.25 mg twice daily.  Continue amiodarone 200 mg daily, and anticoagulation with Eliquis 5 mg twice daily.  Stable H&H on March 14. ? ?2.  Fatigue: Prior to cardioversion, patient was having dyspnea on exertion and fatigue.  She continued have dyspnea and edema post cardioversion was placed on furosemide with resolution of dyspnea but she has had ongoing fatigue in the setting of bradycardia  and intermittent hypotension.  Amlodipine has been discontinued.  With a heart rate in the 40s at home and 50 today, I am reducing her carvedilol to 6.25 mg twice daily.  At this point, is unclear what role

## 2022-01-11 NOTE — Patient Instructions (Signed)
Medication Instructions:  ?Your physician has recommended you make the following change in your medication:  ? ?STOP Amlodipine ?DECREASE Carvedilol to 6.25 mg twice a day ? ?*If you need a refill on your cardiac medications before your next appointment, please call your pharmacy* ? ? ?Lab Work: ?BMET today over at the [facility] entrance. Check in at registration ? ?If you have labs (blood work) drawn today and your tests are completely normal, you will receive your results only by: ?MyChart Message (if you have MyChart) OR ?A paper copy in the mail ?If you have any lab test that is abnormal or we need to change your treatment, we will call you to review the results. ? ? ?Testing/Procedures: ?A chest x-ray takes a picture of the organs and structures inside the chest, including the heart, lungs, and blood vessels. This test can show several things, including, whether the heart is enlarges; whether fluid is building up in the lungs; and whether pacemaker / defibrillator leads are still in place. ? ? ? ?Follow-Up: ?At [facility], you and your health needs are our priority.  As part of our continuing mission to provide you with exceptional heart care, we have created designated Provider Care Teams.  These Care Teams include your primary Cardiologist (physician) and Advanced Practice Providers (APPs -  Physician Assistants and Nurse Practitioners) who all work together to provide you with the care you need, when you need it. ? ? ?Your next appointment:   ?3-4 week(s) ? ?The format for your next appointment:   ?In Person ? ?Provider:   ?Julien Nordmann, MD or Eula Listen, PA-C  ? ? ?Important Information About Sugar ? ? ? ? ?  ?

## 2022-01-11 NOTE — Addendum Note (Signed)
Addended by: Bryna Colander on: 01/11/2022 04:34 PM ? ? Modules accepted: Orders ? ?

## 2022-01-12 ENCOUNTER — Other Ambulatory Visit: Payer: Self-pay | Admitting: *Deleted

## 2022-01-12 DIAGNOSIS — I5022 Chronic systolic (congestive) heart failure: Secondary | ICD-10-CM

## 2022-01-12 MED ORDER — FUROSEMIDE 40 MG PO TABS: 20.0000 mg | ORAL_TABLET | ORAL | 3 refills | Status: DC

## 2022-02-01 ENCOUNTER — Other Ambulatory Visit: Payer: Self-pay

## 2022-02-01 ENCOUNTER — Encounter: Payer: Medicare Other | Attending: Cardiovascular Disease

## 2022-02-01 DIAGNOSIS — I5022 Chronic systolic (congestive) heart failure: Secondary | ICD-10-CM | POA: Insufficient documentation

## 2022-02-01 NOTE — Progress Notes (Signed)
Virtual Visit completed. Patient informed on EP and RD appointment and 6 Minute walk test. Patient also informed of patient health questionnaires on My Chart. Patient Verbalizes understanding. Visit diagnosis can be found in [facility] 01/11/2022. 

## 2022-02-12 ENCOUNTER — Encounter: Payer: Medicare Other | Admitting: *Deleted

## 2022-02-12 VITALS — Ht 65.0 in | Wt 143.7 lb

## 2022-02-12 DIAGNOSIS — I5022 Chronic systolic (congestive) heart failure: Secondary | ICD-10-CM

## 2022-02-12 NOTE — Progress Notes (Signed)
Pulmonary Individual Treatment Plan ? ?Patient Details  ?Name: Robin Ortiz ?MRN: [id number] ?Date of Birth: [birth year] ?Referring Provider:   ?Flowsheet Row Pulmonary Rehab from 02/12/2022 in [facility] Cardiac and Pulmonary Rehab  ?Referring Provider Gollan  ? ?  ? ? ?Initial Encounter Date:  ?Flowsheet Row Pulmonary Rehab from 02/12/2022 in [facility] Cardiac and Pulmonary Rehab  ?Date 02/12/22  ? ?  ? ? ?Visit Diagnosis: Heart failure, chronic systolic (HCC) ? ?Patient's Home Medications on Admission: ? ?Current Outpatient Medications:  ?  amiodarone (PACERONE) 200 MG tablet, Take 1 tablet (200 mg total) by mouth daily., Disp: 90 tablet, Rfl: 3 ?  apixaban (ELIQUIS) 5 MG TABS tablet, Take 1 tablet (5 mg total) by mouth 2 (two) times daily., Disp: 60 tablet, Rfl: 6 ?  Calcium-Vitamin D 600-200 MG-UNIT per tablet, Take 1 tablet by mouth daily., Disp: , Rfl:  ?  carvedilol (COREG) 6.25 MG tablet, Take 1 tablet (6.25 mg total) by mouth 2 (two) times daily., Disp: 180 tablet, Rfl: 3 ?  celecoxib (CELEBREX) 200 MG capsule, Take 200 mg by mouth daily as needed for mild pain., Disp: , Rfl:  ?  diclofenac Sodium (VOLTAREN) 1 % GEL, Apply 2 g topically daily as needed (Arthritis)., Disp: , Rfl:  ?  diphenhydrAMINE-APAP, sleep, (TYLENOL PM EXTRA STRENGTH PO), Take 1 tablet by mouth at bedtime as needed (sleep)., Disp: , Rfl:  ?  furosemide (LASIX) 40 MG tablet, Take 0.5 tablets (20 mg total) by mouth as directed. Take half a tablet once daily and if weight gain of 3 pounds in 24 hours or 5 pounds in one week you may take additional 20 mg., Disp: 30 tablet, Rfl: 3 ?  losartan (COZAAR) 100 MG tablet, Take 1 tablet (100 mg total) by mouth daily., Disp: 90 tablet, Rfl: 3 ?  Misc Natural Products (OSTEO BI-FLEX ADV JOINT SHIELD PO), Take 1 tablet by mouth daily. (Patient not taking: Reported on 02/01/2022), Disp: , Rfl:  ?  Multiple Vitamin (MULTIVITAMIN) tablet, Take 1 tablet by mouth daily. Alive, Disp: , Rfl:  ?  potassium chloride  (KLOR-CON) 10 MEQ tablet, Take 1 tablet (10 mEq total) by mouth every Monday, Wednesday, and Friday. (Patient taking differently: Take 10 mEq by mouth daily.), Disp: 39 tablet, Rfl: 3 ?  raloxifene (EVISTA) 60 MG tablet, Take 60 mg by mouth daily., Disp: , Rfl:  ?  simvastatin (ZOCOR) 20 MG tablet, Take 20 mg by mouth at bedtime., Disp: , Rfl:  ? ?Past Medical History: ?Past Medical History:  ?Diagnosis Date  ? Anxiety   ? Cardiomegaly   ? Chronic fatigue   ? Chronic HFmrEF (heart failure with midrange ejection fraction) ([place])   ? a. 2002 Echo: EF 20-30%; b. 09/2015 Echo: EF 45-50%, diff HK. Mild MR. Mod dil LA; c. 10/2021 Echo: EF 35-40% (in setting of afib), no rwma, low-nl RV fxn, sev dil LA, mildly dil RA, mod MR, mild-mod TR.  ? Essential hypertension   ? Mixed hyperlipidemia   ? NICM (nonischemic cardiomyopathy) ([place])   ? Osteoporosis   ? Persistent atrial fibrillation ([place])   ? a. Dx 10/2021. CHA2DS2VASc = 5-->Eliquis; b. 11/2021 s/p DCCV (120J).  ? ? ?Tobacco Use: ?Social History  ? ?Tobacco Use  ?Smoking Status Never  ?Smokeless Tobacco Never  ? ? ?Labs: ?Review Flowsheet   ? ?    ? View : No data to display.  ?  ?  ?  ?  ?  ? ? ? ?Pulmonary Assessment  Scores: ?  ?UCSD: ?Self-administered rating of dyspnea associated with activities of daily living (ADLs) ?6-point scale (0 = "not at all" to 5 = "maximal or unable to do because of breathlessness")  ?Scoring Scores range from 0 to 120.  Minimally important difference is 5 units ? ?CAT: ?CAT can identify the health impairment of COPD patients and is better correlated with disease progression.  ?CAT has a scoring range of zero to 40. The CAT score is classified into four groups of low (less than 10), medium (10 - 20), high (21-30) and very high (31-40) based on the impact level of disease on health status. A CAT score over 10 suggests significant symptoms.  A worsening CAT score could be explained by an exacerbation, poor medication adherence, poor inhaler  technique, or progression of COPD or comorbid conditions.  ?CAT MCID is 2 points ? ?mMRC: ?mMRC (Modified Medical Research Council) Dyspnea Scale is used to assess the degree of baseline functional disability in patients of respiratory disease due to dyspnea. ?No minimal important difference is established. A decrease in score of 1 point or greater is considered a positive change.  ? ?Pulmonary Function Assessment: ? Pulmonary Function Assessment - 02/01/22 0909   ? ?  ? Breath  ? Shortness of Breath No   ? ?  ?  ? ?  ? ? ?Exercise Target Goals: ?Exercise Program Goal: ?Individual exercise prescription set using results from initial 6 min walk test and THRR while considering  patient?s activity barriers and safety.  ? ?Exercise Prescription Goal: ?Initial exercise prescription builds to 30-45 minutes a day of aerobic activity, 2-3 days per week.  Home exercise guidelines will be given to patient during program as part of exercise prescription that the participant will acknowledge. ? ?Education: Aerobic Exercise: ?- Group verbal and visual presentation on the components of exercise prescription. Introduces F.I.T.T principle from ACSM for exercise prescriptions.  Reviews F.I.T.T. principles of aerobic exercise including progression. Written material given at graduation. ?Flowsheet Row Pulmonary Rehab from 02/12/2022 in [facility] Cardiac and Pulmonary Rehab  ?Education need identified 02/12/22  ? ?  ? ? ?Education: Resistance Exercise: ?- Group verbal and visual presentation on the components of exercise prescription. Introduces F.I.T.T principle from ACSM for exercise prescriptions  Reviews F.I.T.T. principles of resistance exercise including progression. Written material given at graduation. ? ?  ?Education: Exercise & Equipment Safety: ?- Individual verbal instruction and demonstration of equipment use and safety with use of the equipment. ?Flowsheet Row Pulmonary Rehab from 02/12/2022 in [facility] Cardiac and Pulmonary Rehab   ?Date 02/12/22  ?Educator [place]  ?Instruction Review Code 1- Verbalizes Understanding  ? ?  ? ? ?Education: Exercise Physiology & General Exercise Guidelines: ?- Group verbal and written instruction with models to review the exercise physiology of the cardiovascular system and associated critical values. Provides general exercise guidelines with specific guidelines to those with heart or lung disease.  ? ? ?Education: Flexibility, Balance, Mind/Body Relaxation: ?- Group verbal and visual presentation with interactive activity on the components of exercise prescription. Introduces F.I.T.T principle from ACSM for exercise prescriptions. Reviews F.I.T.T. principles of flexibility and balance exercise training including progression. Also discusses the mind body connection.  Reviews various relaxation techniques to help reduce and manage stress (i.e. Deep breathing, progressive muscle relaxation, and visualization). Balance handout provided to take home. Written material given at graduation. ? ? ?Activity Barriers & Risk Stratification: ? ? ?6 Minute Walk: ? 6 Minute Walk   ? ? [place] Name 02/12/22 1512  ?  ?  ?  ?  6 Minute Walk  ? Phase Initial    ? Distance 860 feet    ? Walk Time 6 minutes    ? # of Rest Breaks 0    ? MPH 1.63    ? METS 1.48    ? RPE 13    ? Perceived Dyspnea  2    ? VO2 Peak 5.17    ? Symptoms Yes (comment)    ? Comments left hip pain when walking 3/10    ? Resting HR 63 bpm    ? Resting BP 140/80    ? Resting Oxygen Saturation  98 %    ? Exercise Oxygen Saturation  during 6 min walk 95 %    ? Max Ex. HR 75 bpm    ? Max Ex. BP 166/88    ? 2 Minute Post BP 158/84    ?  ? Interval HR  ? 1 Minute HR 70    ? 2 Minute HR 75    ? 3 Minute HR 75    ? 4 Minute HR 75    ? 5 Minute HR 75    ? 6 Minute HR 76    ? 2 Minute Post HR 59    ? Interval Heart Rate? Yes    ?  ? Interval Oxygen  ? Interval Oxygen? Yes    ? Baseline Oxygen Saturation % 98 %    ? 1 Minute Oxygen Saturation % 96 %    ? 1 Minute Liters of Oxygen  0 L    ? 2 Minute Oxygen Saturation % 95 %    ? 2 Minute Liters of Oxygen 0 L    ? 3 Minute Oxygen Saturation % 96 %    ? 3 Minute Liters of Oxygen 0 L    ? 4 Minute Oxygen Saturation % 97 %    ? 4 Minute Liters

## 2022-02-12 NOTE — Patient Instructions (Signed)
Patient Instructions ? ?Patient Details  ?Name: Robin Ortiz ?MRN: [id number] ?Date of Birth: [birth year] ?Referring Provider:  Antonieta Iba, MD ? ?Below are your personal goals for exercise, nutrition, and risk factors. Our goal is to help you stay on track towards obtaining and maintaining these goals. We will be discussing your progress on these goals with you throughout the program. ? ?Initial Exercise Prescription: ? Initial Exercise Prescription - 02/12/22 1500   ? ?  ? Date of Initial Exercise RX and Referring Provider  ? Date 02/12/22   ? Referring Provider Gollan   ?  ? Oxygen  ? Maintain Oxygen Saturation 88% or higher   ?  ? Treadmill  ? MPH 0.8   ? Grade 0   ? Minutes 15   ? METs 1.48   ?  ? NuStep  ? Level 1   ? SPM 80   ? Minutes 15   ? METs 1.48   ?  ? Biostep-RELP  ? Level 1   ? SPM 50   ? Minutes 15   ? METs 1.48   ?  ? Prescription Details  ? Frequency (times per week) 2   ? Duration Progress to 30 minutes of continuous aerobic without signs/symptoms of physical distress   ?  ? Intensity  ? THRR 40-80% of Max Heartrate 92-121   ? Ratings of Perceived Exertion 11-13   ? Perceived Dyspnea 0-4   ?  ? Progression  ? Progression Continue to progress workloads to maintain intensity without signs/symptoms of physical distress.   ?  ? Resistance Training  ? Training Prescription Yes   ? Weight 2   ? Reps 10-15   ? ?  ?  ? ?  ? ? ?Exercise Goals: ?Frequency: Be able to perform aerobic exercise two to three times per week in program working toward 2-5 days per week of home exercise. ? ?Intensity: Work with a perceived exertion of 11 (fairly light) - 15 (hard) while following your exercise prescription.  We will make changes to your prescription with you as you progress through the program. ?  ?Duration: Be able to do 30 to 45 minutes of continuous aerobic exercise in addition to a 5 minute warm-up and a 5 minute cool-down routine. ?  ?Nutrition Goals: ?Your personal nutrition goals will be established  when you do your nutrition analysis with the dietician. ? ?The following are general nutrition guidelines to follow: ?Cholesterol < 200mg /day ?Sodium < 1500mg /day ?Fiber: Women over 50 yrs - 21 grams per day ? ?Personal Goals: ? Personal Goals and Risk Factors at Admission - 02/12/22 1523   ? ?  ? Core Components/Risk Factors/Patient Goals on Admission  ?  Weight Management Yes;Weight Maintenance   ? Intervention Weight Management: Develop a combined nutrition and exercise program designed to reach desired caloric intake, while maintaining appropriate intake of nutrient and fiber, sodium and fats, and appropriate energy expenditure required for the weight goal.;Weight Management: Provide education and appropriate resources to help participant work on and attain dietary goals.;Weight Management/Obesity: Establish reasonable short term and long term weight goals.   ? Admit Weight 143 lb 11.2 oz (65.2 kg)   ? Goal Weight: Short Term 143 lb 11.2 oz (65.2 kg)   ? Goal Weight: Long Term 143 lb 11.2 oz (65.2 kg)   ? Expected Outcomes Short Term: Continue to assess and modify interventions until short term weight is achieved;Long Term: Adherence to nutrition and physical activity/exercise program aimed toward attainment  of established weight goal;Weight Maintenance: Understanding of the daily nutrition guidelines, which includes 25-35% calories from fat, 7% or less cal from saturated fats, less than 200mg  cholesterol, less than 1.5gm of sodium, & 5 or more servings of fruits and vegetables daily;Understanding recommendations for meals to include 15-35% energy as protein, 25-35% energy from fat, 35-60% energy from carbohydrates, less than 200mg  of dietary cholesterol, 20-35 gm of total fiber daily;Understanding of distribution of calorie intake throughout the day with the consumption of 4-5 meals/snacks   ? Improve shortness of breath with ADL's Yes   ? Intervention Provide education, individualized exercise plan and daily  activity instruction to help decrease symptoms of SOB with activities of daily living.   ? Expected Outcomes Short Term: Improve cardiorespiratory fitness to achieve a reduction of symptoms when performing ADLs;Long Term: Be able to perform more ADLs without symptoms or delay the onset of symptoms   ? Heart Failure Yes   ? Intervention Provide a combined exercise and nutrition program that is supplemented with education, support and counseling about heart failure. Directed toward relieving symptoms such as shortness of breath, decreased exercise tolerance, and extremity edema.   ? Expected Outcomes Improve functional capacity of life;Short term: Attendance in program 2-3 days a week with increased exercise capacity. Reported lower sodium intake. Reported increased fruit and vegetable intake. Reports medication compliance.;Short term: Daily weights obtained and reported for increase. Utilizing diuretic protocols set by physician.;Long term: Adoption of self-care skills and reduction of barriers for early signs and symptoms recognition and intervention leading to self-care maintenance.   ? Hypertension Yes   ? Intervention Provide education on lifestyle modifcations including regular physical activity/exercise, weight management, moderate sodium restriction and increased consumption of fresh fruit, vegetables, and low fat dairy, alcohol moderation, and smoking cessation.;Monitor prescription use compliance.   ? Expected Outcomes Short Term: Continued assessment and intervention until BP is < 140/58mm HG in hypertensive participants. < 130/90mm HG in hypertensive participants with diabetes, heart failure or chronic kidney disease.;Long Term: Maintenance of blood pressure at goal levels.   ? Lipids Yes   ? Intervention Provide education and support for participant on nutrition & aerobic/resistive exercise along with prescribed medications to achieve LDL 70mg , HDL >40mg .   ? Expected Outcomes Short Term: Participant  states understanding of desired cholesterol values and is compliant with medications prescribed. Participant is following exercise prescription and nutrition guidelines.;Long Term: Cholesterol controlled with medications as prescribed, with individualized exercise RX and with personalized nutrition plan. Value goals: LDL < 70mg , HDL > 40 mg.   ? ?  ?  ? ?  ? ? ?Tobacco Use Initial Evaluation: ?Social History  ? ?Tobacco Use  ?Smoking Status Never  ?Smokeless Tobacco Never  ? ? ?Exercise Goals and Review: ? Exercise Goals   ? ? Row Name 02/12/22 1519  ?  ?  ?  ?  ?  ? Exercise Goals  ? Increase Physical Activity Yes      ? Intervention Provide advice, education, support and counseling about physical activity/exercise needs.;Develop an individualized exercise prescription for aerobic and resistive training based on initial evaluation findings, risk stratification, comorbidities and participant's personal goals.      ? Expected Outcomes Short Term: Attend rehab on a regular basis to increase amount of physical activity.;Long Term: Add in home exercise to make exercise part of routine and to increase amount of physical activity.;Long Term: Exercising regularly at least 3-5 days a week.      ? Increase Strength and  Stamina Yes      ? Intervention Provide advice, education, support and counseling about physical activity/exercise needs.;Develop an individualized exercise prescription for aerobic and resistive training based on initial evaluation findings, risk stratification, comorbidities and participant's personal goals.      ? Expected Outcomes Short Term: Increase workloads from initial exercise prescription for resistance, speed, and METs.;Short Term: Perform resistance training exercises routinely during rehab and add in resistance training at home;Long Term: Improve cardiorespiratory fitness, muscular endurance and strength as measured by increased METs and functional capacity ( )      ? Able to understand and  use rate of perceived exertion (RPE) scale Yes      ? Intervention Provide education and explanation on how to use RPE scale      ? Expected Outcomes Short Term: Able to use RPE daily in rehab to express s

## 2022-02-13 DIAGNOSIS — I5022 Chronic systolic (congestive) heart failure: Secondary | ICD-10-CM

## 2022-02-13 NOTE — Progress Notes (Signed)
Daily Session Note ? ?Patient Details  ?Name: Robin Ortiz ?MRN: [id number] ?Date of Birth: 1934-10-09 ?Referring Provider:   ?Flowsheet Row Pulmonary Rehab from 02/12/2022 in [facility]  ?Referring Provider Gollan  ? ?  ? ? ?Encounter Date: 02/13/2022 ? ?Check In: ? Session Check In - 02/13/22 0951   ? ?  ? Check-In  ? Supervising physician immediately available to respond to emergencies See telemetry face sheet for immediately available ER MD   ? Location [facility]-Cardiac & Pulmonary Rehab   ? Staff Present Birdie Sons, MPA, RN;Yashica Sterbenz Amedeo Plenty, BS, ACSM CEP, Exercise Physiologist;Jessica Luan Pulling, MA, RCEP, CCRP, CCET   ? Virtual Visit No   ? Medication changes reported     No   ? Fall or balance concerns reported    No   ? Warm-up and Cool-down Performed on first and last piece of equipment   ? Resistance Training Performed Yes   ? VAD Patient? No   ? PAD/SET Patient? No   ?  ? Pain Assessment  ? Currently in Pain? No/denies   ? ?  ?  ? ?  ? ? ? ? ? ?Social History  ? ?Tobacco Use  ?Smoking Status Never  ?Smokeless Tobacco Never  ? ? ?Goals Met:  ?Independence with exercise equipment ?Exercise tolerated well ?No report of concerns or symptoms today ?Strength training completed today ? ?Goals Unmet:  ?Not Applicable ? ?Comments: First full day of exercise!  Patient was oriented to gym and equipment including functions, settings, policies, and procedures.  Patient's individual exercise prescription and treatment plan were reviewed.  All starting workloads were established based on the results of the 6 minute walk test done at initial orientation visit.  The plan for exercise progression was also introduced and progression will be customized based on patient's performance and goals. ? ? ? ?Dr. Emily Filbert is Medical Director for [place].  ?Dr. Ottie Glazier is Medical Director for [facility] Pulmonary Rehabilitation. ?

## 2022-02-13 NOTE — Progress Notes (Signed)
Cardiology Office Note  Date:  02/16/2022   ID:  Robin Ortiz, DOB [birth year], MRN [id number]  PCP:  Jaclyn Shaggy, MD   Chief Complaint  Patient presents with   Follow-up    "Doing well." Medications reviewed by the patient verbally.      HPI:  [name] is a pleasant 87 year-old woman with a history of  hypertension,  hyperlipidemia,  remote history of nonischemic cardiomyopathy with initial ejection fraction 20-30% in 2002 that has improved to 50-55% on echocardiogram in March 2009,  status post bilateral knee replacement  Anxiety/gets stressed, chronic fatigue  CHA2DS2-VASc of 5. Ejection fraction 35 to 40% in atrial fibrillation Jan 2023 Cardioversion December 14, 2021 for fib flutter, on amiodarone who presents for routine follow up of her cardiomyopathy, atrial fib/flutter  Last seen in clinic by myself March 2023 Seen by one of our providers January 11, 2022 Bradycardia noted, occasional low blood pressure On that visit amlodipine held, carvedilol dosing decreased down to 6.25 twice daily for bradycardia  Lasix daily Last x-ray January 11, 2022, resolution of pleural effusions  Blood pressures running high 130 to 150s at home Feels well , started cardiac rehab  Lasix 20 mg 3x a week Denies significant shortness of breath leg swelling no weight change in fact weight is down 3 pounds from prior clinic visit  Complaints concerning GERD, arthritic pain left leg, neuropathy  EKG personally reviewed by myself on todays visit Shows sinus bradycardia rate 61 bpm nonspecific ST-T wave abnormality, pvcs  Echo reviewed  1. Left ventricular ejection fraction, by estimation, is 35 to 40%. The  left ventricle has moderately decreased function. The left ventricle has  no regional wall motion abnormalities. Left ventricular diastolic  parameters are indeterminate.   2. Right ventricular systolic function is low normal. The right  ventricular size is normal.   3. Left atrial  size was severely dilated.   4. Right atrial size was mildly dilated.   5. The mitral valve is normal in structure. Moderate mitral valve  regurgitation.   Seen by one of our providers October 17, 2021, in atrial fibrillation  Prior echocardiogram history EF previous as low as 20 to 30% in 2002  45 to 50% in December 2016.  Echo 10/18/2021 ef 35 to 40% Moderate mitral valve  regurgitation.  Left atrial size was severely dilated.  Tricuspid valve regurgitation is mild to moderate.   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  PSH:    Past Surgical History:  Procedure Laterality Date   CARDIOVERSION N/A 12/14/2021   Procedure: CARDIOVERSION;  Surgeon: Antonieta Iba, MD;  Location: [facility] ORS;  Service: Cardiovascular;  Laterality: N/A;   ROTATOR CUFF REPAIR     left shoulder   ruptured tendon right foot Right    surgical repair   TOTAL KNEE ARTHROPLASTY Bilateral     Current Outpatient Medications  Medication Sig Dispense Refill   amiodarone (PACERONE) 200 MG tablet Take 1 tablet (200 mg total) by mouth daily. 90 tablet 3   apixaban (ELIQUIS) 5 MG TABS tablet Take 1 tablet (5 mg total) by mouth 2 (two) times daily. 60 tablet 6   Calcium-Vitamin D 600-200 MG-UNIT per tablet Take 1 tablet by mouth daily.     carvedilol (COREG) 6.25 MG tablet Take 1 tablet (6.25  mg total) by mouth 2 (two) times daily. 180 tablet 3   celecoxib (CELEBREX) 200 MG capsule Take 200 mg by mouth daily as needed for mild pain.     diclofenac Sodium (VOLTAREN) 1 % GEL Apply 2 g topically daily as needed (Arthritis).     diphenhydrAMINE-APAP, sleep, (TYLENOL PM EXTRA STRENGTH PO) Take 1 tablet by mouth at bedtime as needed (sleep).     furosemide (LASIX) 40 MG tablet  Take 0.5 tablets (20 mg total) by mouth as directed. Take half a tablet once daily and if weight gain of 3 pounds in 24 hours or 5 pounds in one week you may take additional 20 mg. 30 tablet 3   losartan (COZAAR) 100 MG tablet Take 1 tablet (100 mg total) by mouth daily. 90 tablet 3   Misc Natural Products (OSTEO BI-FLEX ADV JOINT SHIELD PO) Take 1 tablet by mouth daily.     Multiple Vitamin (MULTIVITAMIN) tablet Take 1 tablet by mouth daily. Alive     potassium chloride (KLOR-CON) 10 MEQ tablet Take 1 tablet (10 mEq total) by mouth every Monday, Wednesday, and Friday. (Patient taking differently: Take 10 mEq by mouth daily.) 39 tablet 3   raloxifene (EVISTA) 60 MG tablet Take 60 mg by mouth daily.     simvastatin (ZOCOR) 20 MG tablet Take 20 mg by mouth at bedtime.     No current facility-administered medications for this visit.    Allergies:   Patient has no known allergies.   Social History:  The patient  reports that she has never smoked. She has never used smokeless tobacco. She reports current alcohol use of about 2.0 - 3.0 standard drinks per week. She reports that she does not use drugs.   Family History:   family history includes Breast cancer in her cousin; Breast cancer (age of onset: 82) in her maternal aunt; Breast cancer (age of onset: 54) in her maternal grandmother; Heart disease in her father.   Review of Systems: Review of Systems  Constitutional:  Positive for malaise/fatigue.  HENT: Negative.    Respiratory: Negative.    Cardiovascular: Negative.   Gastrointestinal: Negative.   Musculoskeletal: Negative.   Neurological: Negative.   All other systems reviewed and are negative.  PHYSICAL EXAM: VS:  BP (!) 150/80 (BP Location: Left Arm, Patient Position: Sitting, Cuff Size: Normal)   Pulse 61   Ht 5\' 5"  (1.651 m)   Wt 142 lb 6 oz (64.6 kg)   SpO2 95%   BMI 23.69 kg/m  , BMI Body mass index is 23.69 kg/m.  Constitutional:  oriented to person, place, and time. No  distress.  HENT:  Head: Grossly normal Eyes:  no discharge. No scleral icterus.  Neck: No JVD, no carotid bruits  Cardiovascular: Regular rate and rhythm, no murmurs appreciated Pulmonary/Chest: Clear to auscultation bilaterally, no wheezes or rails Abdominal: Soft.  no distension.  no tenderness.  Musculoskeletal: Normal range of motion Neurological:  normal muscle tone. Coordination normal. No atrophy Skin: Skin warm and dry Psychiatric: normal affect, pleasant  Recent Labs: 10/17/2021: Magnesium 2.2; TSH 1.850 12/12/2021: Hemoglobin 11.8; Platelets 242 12/19/2021: ALT 22 01/11/2022: BUN 27; Creatinine, Ser 1.13; Potassium 4.2; Sodium 142    Lipid Panel No results found for: CHOL, HDL, LDLCALC, TRIG    Wt Readings from Last 3 Encounters:  02/16/22 142 lb 6 oz (64.6 kg)  02/12/22 143 lb 11.2 oz (65.2 kg)  01/11/22 145 lb (65.8 kg)     ASSESSMENT AND PLAN:  Persistent atrial fibrillation Recent successful cardioversion, maintaining normal sinus rhythm Cointinue amiodarone 200 daily, coreg 6.25 BID Continue Eliquis twice daily  Chronic diastolic and systolic CHF Appears euvolemic, continue Lasix 23 days a week Medication changes as below  Mixed hyperlipidemia Continue statin  Essential hypertension Medication changes below  Chronic fatigue/anxiety Exacerbated by atrial fibrillation  Anxiety Stable, managed by primary care  Cardiomyopathy Long history of cardiomyopathy dating back to 2002 Appears euvolemic, Recommend she hold the losartan, start Entresto 97/103 mg twice daily Start half pill twice a day for the first week then up to whole pill continue to monitor blood pressure Repeat echo 2 months   Total encounter time more than 30  minutes  Greater than 50% was spent in counseling and coordination of care with the patient    Orders Placed This Encounter  Procedures   EKG 12-Lead     Signed, Dossie Arbour, M.D., Ph.D. 02/16/2022  [facility] [place], [place] [phone]

## 2022-02-14 ENCOUNTER — Encounter: Payer: Self-pay | Admitting: *Deleted

## 2022-02-14 DIAGNOSIS — I5022 Chronic systolic (congestive) heart failure: Secondary | ICD-10-CM

## 2022-02-14 NOTE — Progress Notes (Signed)
Pulmonary Individual Treatment Plan ? ?Patient Details  ?Name: Robin Ortiz ?MRN: [id number] ?Date of Birth: [birth year] ?Referring Provider:   ?Flowsheet Row Pulmonary Rehab from 02/12/2022 in [facility] Cardiac and Pulmonary Rehab  ?Referring Provider Gollan  ? ?  ? ? ?Initial Encounter Date:  ?Flowsheet Row Pulmonary Rehab from 02/12/2022 in [facility] Cardiac and Pulmonary Rehab  ?Date 02/12/22  ? ?  ? ? ?Visit Diagnosis: Heart failure, chronic systolic (HCC) ? ?Patient's Home Medications on Admission: ? ?Current Outpatient Medications:  ?  amiodarone (PACERONE) 200 MG tablet, Take 1 tablet (200 mg total) by mouth daily., Disp: 90 tablet, Rfl: 3 ?  apixaban (ELIQUIS) 5 MG TABS tablet, Take 1 tablet (5 mg total) by mouth 2 (two) times daily., Disp: 60 tablet, Rfl: 6 ?  Calcium-Vitamin D 600-200 MG-UNIT per tablet, Take 1 tablet by mouth daily., Disp: , Rfl:  ?  carvedilol (COREG) 6.25 MG tablet, Take 1 tablet (6.25 mg total) by mouth 2 (two) times daily., Disp: 180 tablet, Rfl: 3 ?  celecoxib (CELEBREX) 200 MG capsule, Take 200 mg by mouth daily as needed for mild pain., Disp: , Rfl:  ?  diclofenac Sodium (VOLTAREN) 1 % GEL, Apply 2 g topically daily as needed (Arthritis)., Disp: , Rfl:  ?  diphenhydrAMINE-APAP, sleep, (TYLENOL PM EXTRA STRENGTH PO), Take 1 tablet by mouth at bedtime as needed (sleep)., Disp: , Rfl:  ?  furosemide (LASIX) 40 MG tablet, Take 0.5 tablets (20 mg total) by mouth as directed. Take half a tablet once daily and if weight gain of 3 pounds in 24 hours or 5 pounds in one week you may take additional 20 mg., Disp: 30 tablet, Rfl: 3 ?  losartan (COZAAR) 100 MG tablet, Take 1 tablet (100 mg total) by mouth daily., Disp: 90 tablet, Rfl: 3 ?  Misc Natural Products (OSTEO BI-FLEX ADV JOINT SHIELD PO), Take 1 tablet by mouth daily. (Patient not taking: Reported on 02/01/2022), Disp: , Rfl:  ?  Multiple Vitamin (MULTIVITAMIN) tablet, Take 1 tablet by mouth daily. Alive, Disp: , Rfl:  ?  potassium chloride  (KLOR-CON) 10 MEQ tablet, Take 1 tablet (10 mEq total) by mouth every Monday, Wednesday, and Friday. (Patient taking differently: Take 10 mEq by mouth daily.), Disp: 39 tablet, Rfl: 3 ?  raloxifene (EVISTA) 60 MG tablet, Take 60 mg by mouth daily., Disp: , Rfl:  ?  simvastatin (ZOCOR) 20 MG tablet, Take 20 mg by mouth at bedtime., Disp: , Rfl:  ? ?Past Medical History: ?Past Medical History:  ?Diagnosis Date  ? Anxiety   ? Cardiomegaly   ? Chronic fatigue   ? Chronic HFmrEF (heart failure with midrange ejection fraction) ([place])   ? a. 2002 Echo: EF 20-30%; b. 09/2015 Echo: EF 45-50%, diff HK. Mild MR. Mod dil LA; c. 10/2021 Echo: EF 35-40% (in setting of afib), no rwma, low-nl RV fxn, sev dil LA, mildly dil RA, mod MR, mild-mod TR.  ? Essential hypertension   ? Mixed hyperlipidemia   ? NICM (nonischemic cardiomyopathy) ([place])   ? Osteoporosis   ? Persistent atrial fibrillation ([place])   ? a. Dx 10/2021. CHA2DS2VASc = 5-->Eliquis; b. 11/2021 s/p DCCV (120J).  ? ? ?Tobacco Use: ?Social History  ? ?Tobacco Use  ?Smoking Status Never  ?Smokeless Tobacco Never  ? ? ?Labs: ?Review Flowsheet   ? ?    ? View : No data to display.  ?  ?  ?  ?  ?  ? ? ? ?Pulmonary Assessment  Scores: ? Pulmonary Assessment Scores   ? ? Row Name 02/12/22 1526  ?  ?  ?  ? ADL UCSD  ? ADL Phase Entry    ? SOB Score total 53    ? Rest 3    ? Walk 2    ? Stairs 3    ? Bath 2    ? Dress 2    ? Shop 2    ?  ? CAT Score  ? CAT Score 14    ?  ? mMRC Score  ? mMRC Score 2    ? ?  ?  ? ?  ?  ?UCSD: ?Self-administered rating of dyspnea associated with activities of daily living (ADLs) ?6-point scale (0 = "not at all" to 5 = "maximal or unable to do because of breathlessness")  ?Scoring Scores range from 0 to 120.  Minimally important difference is 5 units ? ?CAT: ?CAT can identify the health impairment of COPD patients and is better correlated with disease progression.  ?CAT has a scoring range of zero to 40. The CAT score is classified into four groups of low  (less than 10), medium (10 - 20), high (21-30) and very high (31-40) based on the impact level of disease on health status. A CAT score over 10 suggests significant symptoms.  A worsening CAT score could be explained by an exacerbation, poor medication adherence, poor inhaler technique, or progression of COPD or comorbid conditions.  ?CAT MCID is 2 points ? ?mMRC: ?mMRC (Modified Medical Research Council) Dyspnea Scale is used to assess the degree of baseline functional disability in patients of respiratory disease due to dyspnea. ?No minimal important difference is established. A decrease in score of 1 point or greater is considered a positive change.  ? ?Pulmonary Function Assessment: ? Pulmonary Function Assessment - 02/01/22 0909   ? ?  ? Breath  ? Shortness of Breath No   ? ?  ?  ? ?  ? ? ?Exercise Target Goals: ?Exercise Program Goal: ?Individual exercise prescription set using results from initial 6 min walk test and THRR while considering  patient?s activity barriers and safety.  ? ?Exercise Prescription Goal: ?Initial exercise prescription builds to 30-45 minutes a day of aerobic activity, 2-3 days per week.  Home exercise guidelines will be given to patient during program as part of exercise prescription that the participant will acknowledge. ? ?Education: Aerobic Exercise: ?- Group verbal and visual presentation on the components of exercise prescription. Introduces F.I.T.T principle from ACSM for exercise prescriptions.  Reviews F.I.T.T. principles of aerobic exercise including progression. Written material given at graduation. ?Flowsheet Row Pulmonary Rehab from 02/12/2022 in [facility] Cardiac and Pulmonary Rehab  ?Education need identified 02/12/22  ? ?  ? ? ?Education: Resistance Exercise: ?- Group verbal and visual presentation on the components of exercise prescription. Introduces F.I.T.T principle from ACSM for exercise prescriptions  Reviews F.I.T.T. principles of resistance exercise including  progression. Written material given at graduation. ? ?  ?Education: Exercise & Equipment Safety: ?- Individual verbal instruction and demonstration of equipment use and safety with use of the equipment. ?Flowsheet Row Pulmonary Rehab from 02/12/2022 in [facility] Cardiac and Pulmonary Rehab  ?Date 02/12/22  ?Educator KH  ?Instruction Review Code 1- Verbalizes Understanding  ? ?  ? ? ?Education: Exercise Physiology & General Exercise Guidelines: ?- Group verbal and written instruction with models to review the exercise physiology of the cardiovascular system and associated critical values. Provides general exercise guidelines with specific guidelines to  those with heart or lung disease.  ? ? ?Education: Flexibility, Balance, Mind/Body Relaxation: ?- Group verbal and visual presentation with interactive activity on the components of exercise prescription. Introduces F.I.T.T principle from ACSM for exercise prescriptions. Reviews F.I.T.T. principles of flexibility and balance exercise training including progression. Also discusses the mind body connection.  Reviews various relaxation techniques to help reduce and manage stress (i.e. Deep breathing, progressive muscle relaxation, and visualization). Balance handout provided to take home. Written material given at graduation. ? ? ?Activity Barriers & Risk Stratification: ? ? ?6 Minute Walk: ? 6 Minute Walk   ? ? Row Name 02/12/22 1512  ?  ?  ?  ? 6 Minute Walk  ? Phase Initial    ? Distance 860 feet    ? Walk Time 6 minutes    ? # of Rest Breaks 0    ? MPH 1.63    ? METS 1.48    ? RPE 13    ? Perceived Dyspnea  2    ? VO2 Peak 5.17    ? Symptoms Yes (comment)    ? Comments left hip pain when walking 3/10    ? Resting HR 63 bpm    ? Resting BP 140/80    ? Resting Oxygen Saturation  98 %    ? Exercise Oxygen Saturation  during 6 min walk 95 %    ? Max Ex. HR 75 bpm    ? Max Ex. BP 166/88    ? 2 Minute Post BP 158/84    ?  ? Interval HR  ? 1 Minute HR 70    ? 2 Minute HR 75    ? 3  Minute HR 75    ? 4 Minute HR 75    ? 5 Minute HR 75    ? 6 Minute HR 76    ? 2 Minute Post HR 59    ? Interval Heart Rate? Yes    ?  ? Interval Oxygen  ? Interval Oxygen? Yes    ? Baseline Oxygen Saturation % 98 %

## 2022-02-15 ENCOUNTER — Encounter: Payer: Medicare Other | Admitting: *Deleted

## 2022-02-15 DIAGNOSIS — I5022 Chronic systolic (congestive) heart failure: Secondary | ICD-10-CM | POA: Diagnosis not present

## 2022-02-15 NOTE — Progress Notes (Signed)
Daily Session Note  Patient Details  Name: Robin Ortiz MRN: [id number] Date of Birth: 05/15/1935 Referring Provider:   Flowsheet Row Pulmonary Rehab from 02/12/2022 in [facility] Cardiac and Pulmonary Rehab  Referring Provider Gollan       Encounter Date: 02/15/2022  Check In:  Session Check In - 02/15/22 1009       Check-In   Supervising physician immediately available to respond to emergencies See telemetry face sheet for immediately available ER MD    Location [facility]-Cardiac & Pulmonary Rehab    Staff Present Susanne Bice, RN, BSN, CCRP;Melissa Caiola, RDN, LDN;Kelly Bollinger, MPA, RN    Virtual Visit No    Medication changes reported     No    Fall or balance concerns reported    No    Warm-up and Cool-down Performed on first and last piece of equipment    Resistance Training Performed Yes    VAD Patient? No    PAD/SET Patient? No      Pain Assessment   Currently in Pain? No/denies                Social History   Tobacco Use  Smoking Status Never  Smokeless Tobacco Never    Goals Met:  Exercise tolerated well No report of concerns or symptoms today  Goals Unmet:  Not Applicable  Comments: Pt able to follow exercise prescription today without complaint.  Will continue to monitor for progression.    Dr. Mark Miller is Medical Director for [facility].  Dr. Fuad Aleskerov is Medical Director for [facility] Pulmonary Rehabilitation. 

## 2022-02-16 ENCOUNTER — Encounter: Payer: Self-pay | Admitting: Cardiovascular Disease

## 2022-02-16 ENCOUNTER — Ambulatory Visit (INDEPENDENT_AMBULATORY_CARE_PROVIDER_SITE_OTHER): Payer: Medicare Other | Admitting: Cardiovascular Disease

## 2022-02-16 VITALS — BP 150/80 | HR 61 | Ht 65.0 in | Wt 142.4 lb

## 2022-02-16 DIAGNOSIS — I5022 Chronic systolic (congestive) heart failure: Secondary | ICD-10-CM

## 2022-02-16 DIAGNOSIS — I4819 Other persistent atrial fibrillation: Secondary | ICD-10-CM

## 2022-02-16 DIAGNOSIS — E782 Mixed hyperlipidemia: Secondary | ICD-10-CM

## 2022-02-16 DIAGNOSIS — I10 Essential (primary) hypertension: Secondary | ICD-10-CM

## 2022-02-16 DIAGNOSIS — I428 Other cardiomyopathies: Secondary | ICD-10-CM | POA: Diagnosis not present

## 2022-02-16 DIAGNOSIS — R001 Bradycardia, unspecified: Secondary | ICD-10-CM

## 2022-02-16 DIAGNOSIS — R0602 Shortness of breath: Secondary | ICD-10-CM

## 2022-02-16 MED ORDER — SACUBITRIL-VALSARTAN 97-103 MG PO TABS: 1.0000 | ORAL_TABLET | Freq: Two times a day (BID) | ORAL | 3 refills | Status: DC

## 2022-02-16 NOTE — Patient Instructions (Addendum)
Medication Instructions:  Please start entresto 97/103 mg twice a day (ok to start 1/2 pill twice a day for first week, then increase to full pill) Hold the losartan when you start entresto  If you need a refill on your cardiac medications before your next appointment, please call your pharmacy.   Lab work: No new labs needed  Testing/Procedures:  Your physician has requested that you have an echocardiogram in 2 months. Echocardiography is a painless test that uses sound waves to create images of your heart. It provides your doctor with information about the size and shape of your heart and how well your heart's chambers and valves are working. This procedure takes approximately one hour. There are no restrictions for this procedure.   Follow-Up: At [facility], you and your health needs are our priority.  As part of our continuing mission to provide you with exceptional heart care, we have created designated Provider Care Teams.  These Care Teams include your primary Cardiologist (physician) and Advanced Practice Providers (APPs -  Physician Assistants and Nurse Practitioners) who all work together to provide you with the care you need, when you need it.  You will need a follow up appointment in 3 months  Providers on your designated Care Team:   Nicolasa Ducking, NP Eula Listen, PA-C Cadence Fransico Michael, [place]  COVID-19 Vaccine Information can be found at: [email or web address] For questions related to vaccine distribution or appointments, please email [email or web address] or call [phone].

## 2022-02-20 DIAGNOSIS — I5022 Chronic systolic (congestive) heart failure: Secondary | ICD-10-CM | POA: Diagnosis not present

## 2022-02-20 NOTE — Progress Notes (Signed)
Daily Session Note  Patient Details  Name: Robin Ortiz MRN: [id number] Date of Birth: 24-Oct-1934 Referring Provider:   Flowsheet Row Pulmonary Rehab from 02/12/2022 in [facility] Cardiac and Pulmonary Rehab  Referring Provider Gollan       Encounter Date: 02/20/2022  Check In:  Session Check In - 02/20/22 0955       Check-In   Supervising physician immediately available to respond to emergencies See telemetry face sheet for immediately available ER MD    Staff Present Birdie Sons, MPA, RN;Jessica Luan Pulling, MA, RCEP, CCRP, Rosalio Macadamia, BS, ACSM CEP, Exercise Physiologist    Virtual Visit No    Medication changes reported     No    Fall or balance concerns reported    No    Warm-up and Cool-down Performed on first and last piece of equipment    Resistance Training Performed Yes    VAD Patient? No    PAD/SET Patient? No      Pain Assessment   Currently in Pain? No/denies                Social History   Tobacco Use  Smoking Status Never  Smokeless Tobacco Never    Goals Met:  Independence with exercise equipment Exercise tolerated well No report of concerns or symptoms today Strength training completed today  Goals Unmet:  Not Applicable  Comments: Pt able to follow exercise prescription today without complaint.  Will continue to monitor for progression.    Dr. Emily Filbert is Medical Director for [place].  Dr. Ottie Glazier is Medical Director for [facility] Pulmonary Rehabilitation.

## 2022-02-22 DIAGNOSIS — I5022 Chronic systolic (congestive) heart failure: Secondary | ICD-10-CM

## 2022-02-22 NOTE — Progress Notes (Signed)
Completed initial RD consultation ?

## 2022-02-22 NOTE — Progress Notes (Signed)
Daily Session Note  Patient Details  Name: Robin Ortiz MRN: [id number] Date of Birth: 15-Aug-1935 Referring Provider:   Flowsheet Row Pulmonary Rehab from 02/12/2022 in [facility] Cardiac and Pulmonary Rehab  Referring Provider Gollan       Encounter Date: 02/22/2022  Check In:  Session Check In - 02/22/22 0951       Check-In   Supervising physician immediately available to respond to emergencies See telemetry face sheet for immediately available ER MD    Location [facility]-Cardiac & Pulmonary Rehab    Staff Present Birdie Sons, MPA, Nino Glow, MS, ASCM CEP, Exercise Physiologist;Laureen Owens Shark, BS, RRT, CPFT    Virtual Visit No    Medication changes reported     Yes    Comments Per Rockey Situ, MD: Recommend she hold the losartan, start Entresto 97/103 mg twice daily  Start half pill twice a day for the first week then up to whole pill continue to monitor blood pressure    Fall or balance concerns reported    No    Warm-up and Cool-down Performed on first and last piece of equipment    Resistance Training Performed Yes    VAD Patient? No    PAD/SET Patient? No      Pain Assessment   Currently in Pain? No/denies                Social History   Tobacco Use  Smoking Status Never  Smokeless Tobacco Never    Goals Met:  Independence with exercise equipment Exercise tolerated well No report of concerns or symptoms today Strength training completed today  Goals Unmet:  Not Applicable  Comments: Pt able to follow exercise prescription today without complaint.  Will continue to monitor for progression.    Dr. Emily Filbert is Medical Director for [place].  Dr. Ottie Glazier is Medical Director for [facility] Pulmonary Rehabilitation.

## 2022-02-27 DIAGNOSIS — I5022 Chronic systolic (congestive) heart failure: Secondary | ICD-10-CM | POA: Diagnosis not present

## 2022-02-27 NOTE — Progress Notes (Signed)
Daily Session Note  Patient Details  Name: Robin Ortiz MRN: [id number] Date of Birth: 1934-11-14 Referring Provider:   Flowsheet Row Pulmonary Rehab from 02/12/2022 in [facility] Cardiac and Pulmonary Rehab  Referring Provider Gollan       Encounter Date: 02/27/2022  Check In:  Session Check In - 02/27/22 1003       Check-In   Supervising physician immediately available to respond to emergencies See telemetry face sheet for immediately available ER MD    Location [facility]-Cardiac & Pulmonary Rehab    Staff Present Birdie Sons, MPA, RN;Jessica Parole, MA, RCEP, CCRP, Rosalio Macadamia, BS, ACSM CEP, Exercise Physiologist    Virtual Visit No    Medication changes reported     No    Fall or balance concerns reported    No    Warm-up and Cool-down Performed on first and last piece of equipment    Resistance Training Performed Yes    VAD Patient? No    PAD/SET Patient? No      Pain Assessment   Currently in Pain? No/denies                Social History   Tobacco Use  Smoking Status Never  Smokeless Tobacco Never    Goals Met:  Independence with exercise equipment Exercise tolerated well Personal goals reviewed No report of concerns or symptoms today Strength training completed today  Goals Unmet:  Not Applicable  Comments: Pt able to follow exercise prescription today without complaint.  Will continue to monitor for progression.    Dr. Emily Filbert is Medical Director for [place].  Dr. Ottie Glazier is Medical Director for [facility] Pulmonary Rehabilitation.

## 2022-03-01 ENCOUNTER — Encounter: Payer: Medicare Other | Attending: Cardiovascular Disease

## 2022-03-01 DIAGNOSIS — I5022 Chronic systolic (congestive) heart failure: Secondary | ICD-10-CM | POA: Insufficient documentation

## 2022-03-01 NOTE — Progress Notes (Signed)
Daily Session Note  Patient Details  Name: Robin Ortiz MRN: [id number] Date of Birth: 1934/12/09 Referring Provider:   Flowsheet Row Pulmonary Rehab from 02/12/2022 in [facility] Cardiac and Pulmonary Rehab  Referring Provider Gollan       Encounter Date: 03/01/2022  Check In:  Session Check In - 03/01/22 0949       Check-In   Supervising physician immediately available to respond to emergencies See telemetry face sheet for immediately available ER MD    Location [facility]-Cardiac & Pulmonary Rehab    Staff Present Birdie Sons, MPA, Nino Glow, MS, ASCM CEP, Exercise Physiologist;Noah Tickle, BS, Exercise Physiologist;Laureen Owens Shark, BS, RRT, CPFT    Virtual Visit No    Medication changes reported     No    Fall or balance concerns reported    No    Warm-up and Cool-down Performed on first and last piece of equipment    Resistance Training Performed Yes    VAD Patient? No    PAD/SET Patient? No      Pain Assessment   Currently in Pain? No/denies                Social History   Tobacco Use  Smoking Status Never  Smokeless Tobacco Never    Goals Met:  Independence with exercise equipment Exercise tolerated well No report of concerns or symptoms today Strength training completed today  Goals Unmet:  Not Applicable  Comments: Pt able to follow exercise prescription today without complaint.  Will continue to monitor for progression.    Dr. Emily Filbert is Medical Director for [place].  Dr. Ottie Glazier is Medical Director for [facility] Pulmonary Rehabilitation.

## 2022-03-12 ENCOUNTER — Encounter: Payer: Self-pay | Admitting: Cardiovascular Disease

## 2022-03-13 ENCOUNTER — Encounter: Payer: Medicare Other | Admitting: *Deleted

## 2022-03-13 DIAGNOSIS — I5022 Chronic systolic (congestive) heart failure: Secondary | ICD-10-CM

## 2022-03-13 NOTE — Progress Notes (Signed)
Daily Session Note  Patient Details  Name: Robin Ortiz MRN: [id number] Date of Birth: 22-Dec-1934 Referring Provider:   Flowsheet Row Pulmonary Rehab from 02/12/2022 in [facility] Cardiac and Pulmonary Rehab  Referring Provider Gollan       Encounter Date: 03/13/2022  Check In:  Session Check In - 03/13/22 1003       Check-In   Supervising physician immediately available to respond to emergencies See telemetry face sheet for immediately available ER MD    Location [facility]-Cardiac & Pulmonary Rehab    Staff Present Heath Lark, RN, BSN, CCRP;Melissa [place], RDN, Wilhelmina Mcardle, BS, ACSM CEP, Exercise Physiologist    Virtual Visit No    Medication changes reported     No    Fall or balance concerns reported    No    Warm-up and Cool-down Performed on first and last piece of equipment    Resistance Training Performed Yes    VAD Patient? No    PAD/SET Patient? No      Pain Assessment   Currently in Pain? No/denies                Social History   Tobacco Use  Smoking Status Never  Smokeless Tobacco Never    Goals Met:  Proper associated with RPD/PD & O2 Sat Independence with exercise equipment Exercise tolerated well No report of concerns or symptoms today  Goals Unmet:  Not Applicable  Comments: Pt able to follow exercise prescription today without complaint.  Will continue to monitor for progression.    Dr. Emily Filbert is Medical Director for [place].  Dr. Ottie Glazier is Medical Director for [facility] Pulmonary Rehabilitation.

## 2022-03-14 ENCOUNTER — Encounter: Payer: Self-pay | Admitting: *Deleted

## 2022-03-14 DIAGNOSIS — I5022 Chronic systolic (congestive) heart failure: Secondary | ICD-10-CM

## 2022-03-14 NOTE — Progress Notes (Signed)
Pulmonary Individual Treatment Plan  Patient Details  Name: Robin Ortiz MRN: [id number] Date of Birth: [birth year] Referring Provider:   Flowsheet Row Pulmonary Rehab from 02/12/2022 in [facility] Cardiac and Pulmonary Rehab  Referring Provider Gollan       Initial Encounter Date:  Flowsheet Row Pulmonary Rehab from 02/12/2022 in [facility] Cardiac and Pulmonary Rehab  Date 02/12/22       Visit Diagnosis: Heart failure, chronic systolic ([place])  Patient's Home Medications on Admission:  Current Outpatient Medications:    amiodarone (PACERONE) 200 MG tablet, Take 1 tablet (200 mg total) by mouth daily., Disp: 90 tablet, Rfl: 3   apixaban (ELIQUIS) 5 MG TABS tablet, Take 1 tablet (5 mg total) by mouth 2 (two) times daily., Disp: 60 tablet, Rfl: 6   Calcium-Vitamin D 600-200 MG-UNIT per tablet, Take 1 tablet by mouth daily., Disp: , Rfl:    carvedilol (COREG) 6.25 MG tablet, Take 1 tablet (6.25 mg total) by mouth 2 (two) times daily., Disp: 180 tablet, Rfl: 3   celecoxib (CELEBREX) 200 MG capsule, Take 200 mg by mouth daily as needed for mild pain., Disp: , Rfl:    diclofenac Sodium (VOLTAREN) 1 % GEL, Apply 2 g topically daily as needed (Arthritis)., Disp: , Rfl:    diphenhydrAMINE-APAP, sleep, (TYLENOL PM EXTRA STRENGTH PO), Take 1 tablet by mouth at bedtime as needed (sleep)., Disp: , Rfl:    furosemide (LASIX) 40 MG tablet, Take 0.5 tablets (20 mg total) by mouth as directed. Take half a tablet once daily and if weight gain of 3 pounds in 24 hours or 5 pounds in one week you may take additional 20 mg., Disp: 30 tablet, Rfl: 3   Misc Natural Products (OSTEO BI-FLEX ADV JOINT SHIELD PO), Take 1 tablet by mouth daily., Disp: , Rfl:    Multiple Vitamin (MULTIVITAMIN) tablet, Take 1 tablet by mouth daily. Alive, Disp: , Rfl:    potassium chloride (KLOR-CON) 10 MEQ tablet, Take 1 tablet (10 mEq total) by mouth every Monday, Wednesday, and Friday. (Patient taking differently: Take 10 mEq by mouth  daily.), Disp: 39 tablet, Rfl: 3   raloxifene (EVISTA) 60 MG tablet, Take 60 mg by mouth daily., Disp: , Rfl:    sacubitril-valsartan (ENTRESTO) 97-103 MG, Take 1 tablet by mouth 2 (two) times daily., Disp: 60 tablet, Rfl: 3   simvastatin (ZOCOR) 20 MG tablet, Take 20 mg by mouth at bedtime., Disp: , Rfl:   Past Medical History: Past Medical History:  Diagnosis Date   Anxiety    Cardiomegaly    Chronic fatigue    Chronic HFmrEF (heart failure with midrange ejection fraction) ([place])    a. 2002 Echo: EF 20-30%; b. 09/2015 Echo: EF 45-50%, diff HK. Mild MR. Mod dil LA; c. 10/2021 Echo: EF 35-40% (in setting of afib), no rwma, low-nl RV fxn, sev dil LA, mildly dil RA, mod MR, mild-mod TR.   Essential hypertension    Mixed hyperlipidemia    NICM (nonischemic cardiomyopathy) ([facility])    Osteoporosis    Persistent atrial fibrillation ([facility])    a. Dx 10/2021. CHA2DS2VASc = 5-->Eliquis; b. 11/2021 s/p DCCV (120J).    Tobacco Use: Social History   Tobacco Use  Smoking Status Never  Smokeless Tobacco Never    Labs: Review Flowsheet        No data to display           Pulmonary Assessment Scores:  Pulmonary Assessment Scores     Row Name 02/12/22 1526  ADL UCSD   ADL Phase Entry     SOB Score total 53     Rest 3     Walk 2     Stairs 3     Bath 2     Dress 2     Shop 2       CAT Score   CAT Score 14       mMRC Score   mMRC Score 2              UCSD: Self-administered rating of dyspnea associated with activities of daily living (ADLs) 6-point scale (0 = "not at all" to 5 = "maximal or unable to do because of breathlessness")  Scoring Scores range from 0 to 120.  Minimally important difference is 5 units  CAT: CAT can identify the health impairment of COPD patients and is better correlated with disease progression.  CAT has a scoring range of zero to 40. The CAT score is classified into four groups of low (less than 10), medium (10 - 20), high (21-30) and  very high (31-40) based on the impact level of disease on health status. A CAT score over 10 suggests significant symptoms.  A worsening CAT score could be explained by an exacerbation, poor medication adherence, poor inhaler technique, or progression of COPD or comorbid conditions.  CAT MCID is 2 points  mMRC: mMRC (Modified Medical Research Council) Dyspnea Scale is used to assess the degree of baseline functional disability in patients of respiratory disease due to dyspnea. No minimal important difference is established. A decrease in score of 1 point or greater is considered a positive change.   Pulmonary Function Assessment:  Pulmonary Function Assessment - 02/01/22 0909       Breath   Shortness of Breath No             Exercise Target Goals: Exercise Program Goal: Individual exercise prescription set using results from initial 6 min walk test and THRR while considering  patient's activity barriers and safety.   Exercise Prescription Goal: Initial exercise prescription builds to 30-45 minutes a day of aerobic activity, 2-3 days per week.  Home exercise guidelines will be given to patient during program as part of exercise prescription that the participant will acknowledge.  Education: Aerobic Exercise: - Group verbal and visual presentation on the components of exercise prescription. Introduces F.I.T.T principle from ACSM for exercise prescriptions.  Reviews F.I.T.T. principles of aerobic exercise including progression. Written material given at graduation. Flowsheet Row Pulmonary Rehab from 02/22/2022 in [facility] Cardiac and Pulmonary Rehab  Education need identified 02/12/22       Education: Resistance Exercise: - Group verbal and visual presentation on the components of exercise prescription. Introduces F.I.T.T principle from ACSM for exercise prescriptions  Reviews F.I.T.T. principles of resistance exercise including progression. Written material given at graduation.     Education: Exercise & Equipment Safety: - Individual verbal instruction and demonstration of equipment use and safety with use of the equipment. Flowsheet Row Pulmonary Rehab from 02/22/2022 in [facility] Cardiac and Pulmonary Rehab  Date 02/12/22  Educator [facility]  Instruction Review Code 1- Verbalizes Understanding       Education: Exercise Physiology & General Exercise Guidelines: - Group verbal and written instruction with models to review the exercise physiology of the cardiovascular system and associated critical values. Provides general exercise guidelines with specific guidelines to those with heart or lung disease.    Education: Flexibility, Balance, Mind/Body Relaxation: - Group verbal and visual presentation with  interactive activity on the components of exercise prescription. Introduces F.I.T.T principle from ACSM for exercise prescriptions. Reviews F.I.T.T. principles of flexibility and balance exercise training including progression. Also discusses the mind body connection.  Reviews various relaxation techniques to help reduce and manage stress (i.e. Deep breathing, progressive muscle relaxation, and visualization). Balance handout provided to take home. Written material given at graduation. Flowsheet Row Pulmonary Rehab from 02/22/2022 in [facility] Cardiac and Pulmonary Rehab  Date 02/15/22  Educator [facility]  Instruction Review Code 1- Verbalizes Understanding       Activity Barriers & Risk Stratification:   6 Minute Walk:  6 Minute Walk     Row Name 02/12/22 1512         6 Minute Walk   Phase Initial     Distance 860 feet     Walk Time 6 minutes     # of Rest Breaks 0     MPH 1.63     METS 1.48     RPE 13     Perceived Dyspnea  2     VO2 Peak 5.17     Symptoms Yes (comment)     Comments left hip pain when walking 3/10     Resting HR 63 bpm     Resting BP 140/80     Resting Oxygen Saturation  98 %     Exercise Oxygen Saturation  during 6 min walk 95 %     Max Ex. HR 75 bpm      Max Ex. BP 166/88     2 Minute Post BP 158/84       Interval HR   1 Minute HR 70     2 Minute HR 75     3 Minute HR 75     4 Minute HR 75     5 Minute HR 75     6 Minute HR 76     2 Minute Post HR 59     Interval Heart Rate? Yes       Interval Oxygen   Interval Oxygen? Yes     Baseline Oxygen Saturation % 98 %     1 Minute Oxygen Saturation % 96 %     1 Minute Liters of Oxygen 0 L     2 Minute Oxygen Saturation % 95 %     2 Minute Liters of Oxygen 0 L     3 Minute Oxygen Saturation % 96 %     3 Minute Liters of Oxygen 0 L     4 Minute Oxygen Saturation % 97 %     4 Minute Liters of Oxygen 0 L     5 Minute Oxygen Saturation % 96 %     5 Minute Liters of Oxygen 0 L     6 Minute Oxygen Saturation % 96 %     6 Minute Liters of Oxygen 0 L     2 Minute Post Oxygen Saturation % 98 %     2 Minute Post Liters of Oxygen 0 L             Oxygen Initial Assessment:  Oxygen Initial Assessment - 02/12/22 1649       Home Oxygen   Home Oxygen Device None    Sleep Oxygen Prescription None    Home Exercise Oxygen Prescription None    Home Resting Oxygen Prescription None    Compliance with Home Oxygen Use Yes      Initial 6 min Walk  Oxygen Used None      Program Oxygen Prescription   Program Oxygen Prescription None      Intervention   Short Term Goals To learn and understand importance of monitoring SPO2 with pulse oximeter and demonstrate accurate use of the pulse oximeter.;To learn and understand importance of maintaining oxygen saturations>88%;To learn and demonstrate proper pursed lip breathing techniques or other breathing techniques.     Long  Term Goals Maintenance of O2 saturations>88%;Exhibits proper breathing techniques, such as pursed lip breathing or other method taught during program session;Verbalizes importance of monitoring SPO2 with pulse oximeter and return demonstration             Oxygen Re-Evaluation:  Oxygen Re-Evaluation     Row Name  02/13/22 0953 02/27/22 1017           Program Oxygen Prescription   Program Oxygen Prescription None None        Home Oxygen   Home Oxygen Device None None      Sleep Oxygen Prescription None None      Home Exercise Oxygen Prescription None None      Home Resting Oxygen Prescription None None      Compliance with Home Oxygen Use Yes Yes        Goals/Expected Outcomes   Short Term Goals To learn and understand importance of monitoring SPO2 with pulse oximeter and demonstrate accurate use of the pulse oximeter.;To learn and understand importance of maintaining oxygen saturations>88%;To learn and demonstrate proper pursed lip breathing techniques or other breathing techniques.  To learn and understand importance of monitoring SPO2 with pulse oximeter and demonstrate accurate use of the pulse oximeter.;To learn and understand importance of maintaining oxygen saturations>88%;To learn and demonstrate proper pursed lip breathing techniques or other breathing techniques.       Long  Term Goals Maintenance of O2 saturations>88%;Exhibits proper breathing techniques, such as pursed lip breathing or other method taught during program session;Verbalizes importance of monitoring SPO2 with pulse oximeter and return demonstration Maintenance of O2 saturations>88%;Exhibits proper breathing techniques, such as pursed lip breathing or other method taught during program session;Verbalizes importance of monitoring SPO2 with pulse oximeter and return demonstration      Comments Reviewed PLB technique with pt.  Talked about how it works and it's importance in maintaining their exercise saturations. Patient reports using PLB to help control SOB during exercise. Patient was encouraged to monitor SaO2 during exercise while exercising at home.      Goals/Expected Outcomes Short: Become more profiecient at using PLB.   Long: Become independent at using PLB. Short: Continue to use PBL and start monitoring SaO2 at home during  exercise. Long: Become independent with PLB and exercise program.               Oxygen Discharge (Final Oxygen Re-Evaluation):  Oxygen Re-Evaluation - 02/27/22 1017       Program Oxygen Prescription   Program Oxygen Prescription None      Home Oxygen   Home Oxygen Device None    Sleep Oxygen Prescription None    Home Exercise Oxygen Prescription None    Home Resting Oxygen Prescription None    Compliance with Home Oxygen Use Yes      Goals/Expected Outcomes   Short Term Goals To learn and understand importance of monitoring SPO2 with pulse oximeter and demonstrate accurate use of the pulse oximeter.;To learn and understand importance of maintaining oxygen saturations>88%;To learn and demonstrate proper pursed lip breathing techniques or other breathing techniques.  Long  Term Goals Maintenance of O2 saturations>88%;Exhibits proper breathing techniques, such as pursed lip breathing or other method taught during program session;Verbalizes importance of monitoring SPO2 with pulse oximeter and return demonstration    Comments Patient reports using PLB to help control SOB during exercise. Patient was encouraged to monitor SaO2 during exercise while exercising at home.    Goals/Expected Outcomes Short: Continue to use PBL and start monitoring SaO2 at home during exercise. Long: Become independent with PLB and exercise program.             Initial Exercise Prescription:  Initial Exercise Prescription - 02/12/22 1500       Date of Initial Exercise RX and Referring Provider   Date 02/12/22    Referring Provider Gollan      Oxygen   Maintain Oxygen Saturation 88% or higher      Treadmill   MPH 0.8    Grade 0    Minutes 15    METs 1.48      NuStep   Level 1    SPM 80    Minutes 15    METs 1.48      Biostep-RELP   Level 1    SPM 50    Minutes 15    METs 1.48      Prescription Details   Frequency (times per week) 2    Duration Progress to 30 minutes of  continuous aerobic without signs/symptoms of physical distress      Intensity   THRR 40-80% of Max Heartrate 92-121    Ratings of Perceived Exertion 11-13    Perceived Dyspnea 0-4      Progression   Progression Continue to progress workloads to maintain intensity without signs/symptoms of physical distress.      Resistance Training   Training Prescription Yes    Weight 2    Reps 10-15             Perform Capillary Blood Glucose checks as needed.  Exercise Prescription Changes:   Exercise Prescription Changes     Row Name 02/12/22 1500 02/22/22 1200 02/27/22 1000 03/07/22 0800       Response to Exercise   Blood Pressure (Admit) 140/80 124/62 -- 120/58    Blood Pressure (Exercise) 166/88 -- -- 160/78    Blood Pressure (Exit) 120/60 132/68 -- 90/56    Heart Rate (Admit) 63 bpm 62 bpm -- 50 bpm    Heart Rate (Exercise) 76 bpm 65 bpm -- 83 bpm    Heart Rate (Exit) 49 bpm 60 bpm -- 60 bpm    Oxygen Saturation (Admit) 98 % 97 % -- 96 %    Oxygen Saturation (Exercise) 95 % 94 % -- 84 %    Oxygen Saturation (Exit) 96 % 97 % -- 98 %    Rating of Perceived Exertion (Exercise) 13 15 -- 13    Perceived Dyspnea (Exercise) 2 2 -- 1    Symptoms 3/10 hip pain with walking SOB -- none    Comments 6 MWT results -- -- --    Duration -- Progress to 30 minutes of  aerobic without signs/symptoms of physical distress Progress to 30 minutes of  aerobic without signs/symptoms of physical distress Progress to 30 minutes of  aerobic without signs/symptoms of physical distress    Intensity -- THRR unchanged THRR unchanged THRR unchanged      Progression   Progression -- Continue to progress workloads to maintain intensity without signs/symptoms of physical distress. Continue to  progress workloads to maintain intensity without signs/symptoms of physical distress. Continue to progress workloads to maintain intensity without signs/symptoms of physical distress.    Average METs -- 1.86 1.86 1.81       Resistance Training   Training Prescription -- Yes Yes Yes    Weight -- 2 lb 2 lb 2 lb    Reps -- 10-15 10-15 10-15      Interval Training   Interval Training -- No No No      Treadmill   MPH -- 0.8 0.8 1.4    Grade -- 0 0 0    Minutes -- METs -- 1.6 1.6 2.07      NuStep   Level -- Minutes -- METs -- Biostep-RELP   Level -- Minutes -- METs -- 2 2 1.2      Home Exercise Plan   Plans to continue exercise at -- -- Home (comment)  walking at home outside or on TM and continue to do PT exercises. Home (comment)  walking at home outside or on TM and continue to do PT exercises.    Frequency -- -- Add 2 additional days to program exercise sessions. Add 2 additional days to program exercise sessions.    Initial Home Exercises Provided -- -- 02/27/22 02/27/22      Oxygen   Maintain Oxygen Saturation -- 88% or higher 88% or higher 88% or higher             Exercise Comments:   Exercise Comments     Row Name 02/13/22 1610           Exercise Comments First full day of exercise!  Patient was oriented to gym and equipment including functions, settings, policies, and procedures.  Patient's individual exercise prescription and treatment plan were reviewed.  All starting workloads were established based on the results of the 6 minute walk test done at initial orientation visit.  The plan for exercise progression was also introduced and progression will be customized based on patient's performance and goals.                Exercise Goals and Review:   Exercise Goals     Row Name 02/12/22 1519             Exercise Goals   Increase Physical Activity Yes       Intervention Provide advice, education, support and counseling about physical activity/exercise needs.;Develop an individualized exercise prescription for aerobic and resistive training based on initial evaluation findings, risk stratification,  comorbidities and participant's personal goals.       Expected Outcomes Short Term: Attend rehab on a regular basis to increase amount of physical activity.;Long Term: Add in home exercise to make exercise part of routine and to increase amount of physical activity.;Long Term: Exercising regularly at least 3-5 days a week.       Increase Strength and Stamina Yes       Intervention Provide advice, education, support and counseling about physical activity/exercise needs.;Develop an individualized exercise prescription for aerobic and resistive training based on initial evaluation findings, risk stratification, comorbidities and participant's personal goals.       Expected Outcomes Short Term: Increase workloads from initial exercise prescription for resistance, speed, and METs.;Short Term: Perform resistance training  exercises routinely during rehab and add in resistance training at home;Long Term: Improve cardiorespiratory fitness, muscular endurance and strength as measured by increased METs and functional capacity ( )       Able to understand and use rate of perceived exertion (RPE) scale Yes       Intervention Provide education and explanation on how to use RPE scale       Expected Outcomes Short Term: Able to use RPE daily in rehab to express subjective intensity level;Long Term:  Able to use RPE to guide intensity level when exercising independently       Able to understand and use Dyspnea scale Yes       Intervention Provide education and explanation on how to use Dyspnea scale       Expected Outcomes Short Term: Able to use Dyspnea scale daily in rehab to express subjective sense of shortness of breath during exertion;Long Term: Able to use Dyspnea scale to guide intensity level when exercising independently       Knowledge and understanding of Target Heart Rate Range (THRR) Yes       Intervention Provide education and explanation of THRR including how the numbers were predicted and where they  are located for reference       Expected Outcomes Short Term: Able to state/look up THRR;Long Term: Able to use THRR to govern intensity when exercising independently;Short Term: Able to use daily as guideline for intensity in rehab       Able to check pulse independently Yes       Intervention Provide education and demonstration on how to check pulse in carotid and radial arteries.;Review the importance of being able to check your own pulse for safety during independent exercise       Expected Outcomes Short Term: Able to explain why pulse checking is important during independent exercise;Long Term: Able to check pulse independently and accurately       Understanding of Exercise Prescription Yes       Intervention Provide education, explanation, and written materials on patient's individual exercise prescription       Expected Outcomes Short Term: Able to explain program exercise prescription;Long Term: Able to explain home exercise prescription to exercise independently                Exercise Goals Re-Evaluation :  Exercise Goals Re-Evaluation     Row Name 02/13/22 2706 02/22/22 1241 02/27/22 1000 03/07/22 0847       Exercise Goal Re-Evaluation   Exercise Goals Review Increase Physical Activity;Able to understand and use rate of perceived exertion (RPE) scale;Knowledge and understanding of Target Heart Rate Range (THRR);Understanding of Exercise Prescription;Able to understand and use Dyspnea scale;Able to check pulse independently;Increase Strength and Stamina Increase Physical Activity;Increase Strength and Stamina;Understanding of Exercise Prescription Increase Physical Activity;Increase Strength and Stamina;Able to understand and use rate of perceived exertion (RPE) scale;Able to understand and use Dyspnea scale;Knowledge and understanding of Target Heart Rate Range (THRR);Able to check pulse independently;Understanding of Exercise Prescription Increase Physical Activity;Increase  Strength and Stamina;Understanding of Exercise Prescription    Comments Reviewed RPE and dyspnea scales, THR and program prescription with pt today.  Pt voiced understanding and was given a copy of goals to take home. Robin Ortiz is off to a good start in rehab. She has completed her first four full days of exercise thus far.  We will continue to montior her progress. Reviewed home exercise with pt today.  Pt plans to walk and do PT exercises  for exercise.  Reviewed THR, pulse, RPE, sign and symptoms, pulse oximetery and when to call 911 or MD.  Also discussed weather considerations and indoor options.  Pt voiced understanding. Robin Ortiz has been doing well in rehab. She improved to level 2 on the biostep machine, and the T4 NuStep. She has also increased her speed on the treadmill to 1.4 mph. She has tolerated using 2 lb hand weights for resistance training. We will continue to monitor her progress in the program.    Expected Outcomes Short: Use RPE daily to regulate intensity. Long: Follow program prescription in THR. Short: Continue to attend rehab regularly Long: Continue to follow program prescription Short: add 1-2 days of exercise at home on off days of class. Long: become independent with exercise routine. Short: continue to increase speed on treadmill. Long: Continue to increase strength and stamina.             Discharge Exercise Prescription (Final Exercise Prescription Changes):  Exercise Prescription Changes - 03/07/22 0800       Response to Exercise   Blood Pressure (Admit) 120/58    Blood Pressure (Exercise) 160/78    Blood Pressure (Exit) 90/56    Heart Rate (Admit) 50 bpm    Heart Rate (Exercise) 83 bpm    Heart Rate (Exit) 60 bpm    Oxygen Saturation (Admit) 96 %    Oxygen Saturation (Exercise) 84 %    Oxygen Saturation (Exit) 98 %    Rating of Perceived Exertion (Exercise) 13    Perceived Dyspnea (Exercise) 1    Symptoms none    Duration Progress to 30 minutes of  aerobic  without signs/symptoms of physical distress    Intensity THRR unchanged      Progression   Progression Continue to progress workloads to maintain intensity without signs/symptoms of physical distress.    Average METs 1.81      Resistance Training   Training Prescription Yes    Weight 2 lb    Reps 10-15      Interval Training   Interval Training No      Treadmill   MPH 1.4    Grade 0    Minutes 15    METs 2.07      NuStep   Level 2    Minutes 15    METs 2      Biostep-RELP   Level 2    Minutes 15    METs 1.2      Home Exercise Plan   Plans to continue exercise at Home (comment)   walking at home outside or on TM and continue to do PT exercises.   Frequency Add 2 additional days to program exercise sessions.    Initial Home Exercises Provided 02/27/22      Oxygen   Maintain Oxygen Saturation 88% or higher             Nutrition:  Target Goals: Understanding of nutrition guidelines, daily intake of sodium 1500mg , cholesterol 200mg , calories 30% from fat and 7% or less from saturated fats, daily to have 5 or more servings of fruits and vegetables.  Education: All About Nutrition: -Group instruction provided by verbal, written material, interactive activities, discussions, models, and posters to present general guidelines for heart healthy nutrition including fat, fiber, MyPlate, the role of sodium in heart healthy nutrition, utilization of the nutrition label, and utilization of this knowledge for meal planning. Follow up email sent as well. Written material given at graduation. Flowsheet Row Pulmonary  Rehab from 02/22/2022 in [facility] Cardiac and Pulmonary Rehab  Education need identified 02/12/22  Date 02/22/22  Educator [facility]  Instruction Review Code 1- Verbalizes Understanding       Biometrics:  Pre Biometrics - 02/12/22 1520       Pre Biometrics   Height  (1.651 m)    Weight 143 lb 11.2 oz (65.2 kg)    BMI (Calculated) 23.91    Single Leg Stand 2.1  seconds              Nutrition Therapy Plan and Nutrition Goals:  Nutrition Therapy & Goals - 02/22/22 1215       Nutrition Therapy   Diet Heart healthy, low Na    Drug/Food Interactions Statins/Certain Fruits    Protein (specify units) 60-65g    Fiber 21 grams    Whole Grain Foods 3 servings    Saturated Fats 12 max. grams    Fruits and Vegetables 8 servings/day    Sodium 2 grams      Personal Nutrition Goals   Nutrition Goal ST: add protein/fat to snacks (peanut butter, cottage cheese, cheese cubes), try protein ball recipe LT: Maintain weight, meet calorie/protein needs    Comments 87 y.o. F admitted to rehab for chronic systolic heart failure. PMHx includes HTN, HLD, osteoporosis, NICM, anxiety, cardiomegaly, chronic fatigue. Relevant medications includes Ca-vit D, lasix, K+, MVI, evesta, zocor. PYP Score: 61. Vegetables & Fruits 8/12. Breads, Grains & Cereals 5/12. Red & Processed Meat 8/12. Poultry 2/2. Fish & Shellfish 1/4. Beans, Nuts & Seeds 2/4. Milk & Dairy Foods 3/6. Toppings, Oils, Seasonings & Salt 12/20. Sweets, Snacks & Restaurant Food 11/14. Beverages 9/10.  Robin Ortiz tries to eat smaller, more frequent meals as her appetite has been lower. She reports her doctor told her not to lose anymore weight. She enjoys fruit and vegetables and keeps starchy vegetables and grains to a minimum on her plate. She enjoys chicken as her primary source of protein and is open to including other sources of protein for snacks such as peanut butter, cheese, and cottage cheese to add with her fruit to bump up the nutrition. She uses olive oil and some butter for cooking and she does not salt her food. For breakfast she enjoys having a bar or cream of wehat or oatmeal - encouraged her to have oatmeal more often and bulking up it with fruit like berries, and nuts/seeds. She enjoys sourdough bread, but does not eat it frequently. She used to enjoy sweets more, but has not had as much of a taste for  them - discussed another snack option such as energy balls that include nut/seed butter, oats, some liquid sweetener or water to add mositure, and add ins like dried friet, chocolate chips, or cocoa powder - she was interested in this. She limits her fluid, but water is her drink of choice. Discussed general heart healthy eating.      Intervention Plan   Intervention Prescribe, educate and counsel regarding individualized specific dietary modifications aiming towards targeted core components such as weight, hypertension, lipid management, diabetes, heart failure and other comorbidities.    Expected Outcomes Short Term Goal: Understand basic principles of dietary content, such as calories, fat, sodium, cholesterol and nutrients.;Short Term Goal: A plan has been developed with personal nutrition goals set during dietitian appointment.;Long Term Goal: Adherence to prescribed nutrition plan.             Nutrition Assessments:  MEDIFICTS Score Key: ?70 Need to make  dietary changes  40-70 Heart Healthy Diet ? 40 Therapeutic Level Cholesterol Diet  Flowsheet Row Pulmonary Rehab from 02/12/2022 in [facility] Cardiac and Pulmonary Rehab  Picture Your Plate Total Score on Admission 61      Picture Your Plate Scores: <04 Unhealthy dietary pattern with much room for improvement. 41-50 Dietary pattern unlikely to meet recommendations for good health and room for improvement. 51-60 More healthful dietary pattern, with some room for improvement.  >60 Healthy dietary pattern, although there may be some specific behaviors that could be improved.   Nutrition Goals Re-Evaluation:   Nutrition Goals Discharge (Final Nutrition Goals Re-Evaluation):   Psychosocial: Target Goals: Acknowledge presence or absence of significant depression and/or stress, maximize coping skills, provide positive support system. Participant is able to verbalize types and ability to use techniques and skills needed for reducing  stress and depression.   Education: Stress, Anxiety, and Depression - Group verbal and visual presentation to define topics covered.  Reviews how body is impacted by stress, anxiety, and depression.  Also discusses healthy ways to reduce stress and to treat/manage anxiety and depression.  Written material given at graduation.   Education: Sleep Hygiene -Provides group verbal and written instruction about how sleep can affect your health.  Define sleep hygiene, discuss sleep cycles and impact of sleep habits. Review good sleep hygiene tips.    Initial Review & Psychosocial Screening:  Initial Psych Review & Screening - 02/01/22 0913       Initial Review   Current issues with None Identified      Family Dynamics   Good Support System? Yes    Comments Robin Ortiz is in good spirtirs and has four kids that she can turn to for support. She has her husband that she looks after at times and has great support from him.      Barriers   Psychosocial barriers to participate in program The patient should benefit from training in stress management and relaxation.;There are no identifiable barriers or psychosocial needs.      Screening Interventions   Interventions Encouraged to exercise;Provide feedback about the scores to participant;To provide support and resources with identified psychosocial needs    Expected Outcomes Long Term Goal: Stressors or current issues are controlled or eliminated.;Short Term goal: Utilizing psychosocial counselor, staff and physician to assist with identification of specific Stressors or current issues interfering with healing process. Setting desired goal for each stressor or current issue identified.;Short Term goal: Identification and review with participant of any Quality of Life or Depression concerns found by scoring the questionnaire.;Long Term goal: The participant improves quality of Life and PHQ9 Scores as seen by post scores and/or verbalization of changes              Quality of Life Scores:  Quality of Life - 02/12/22 1523       Quality of Life   Select Quality of Life      Quality of Life Scores   Health/Function Pre 22.23 %    Socioeconomic Pre 24 %    Psych/Spiritual Pre 23.36 %    Family Pre 25.2 %    GLOBAL Pre 23.31 %            Scores of 19 and below usually indicate a poorer quality of life in these areas.  A difference of  2-3 points is a clinically meaningful difference.  A difference of 2-3 points in the total score of the Quality of Life Index has been associated with significant  improvement in overall quality of life, self-image, physical symptoms, and general health in studies assessing change in quality of life.  PHQ-9: Review Flowsheet       02/12/2022  Depression screen PHQ 2/9  Decreased Interest 1  Down, Depressed, Hopeless 1  PHQ - 2 Score 2  Altered sleeping 1  Tired, decreased energy 1  Change in appetite 3  Feeling bad or failure about yourself  1  Trouble concentrating 0  Moving slowly or fidgety/restless 1  Suicidal thoughts 0  PHQ-9 Score 9  Difficult doing work/chores Somewhat difficult   Interpretation of Total Score  Total Score Depression Severity:  1-4 = Minimal depression, 5-9 = Mild depression, 10-14 = Moderate depression, 15-19 = Moderately severe depression, 20-27 = Severe depression   Psychosocial Evaluation and Intervention:  Psychosocial Evaluation - 02/01/22 0915       Psychosocial Evaluation & Interventions   Interventions Relaxation education;Stress management education;Encouraged to exercise with the program and follow exercise prescription    Comments Robin Ortiz is in good spirtirs and has four kids that she can turn to for support. She has her husband that she looks after at times and has great support from him.    Expected Outcomes Short: Start [facility] to help with mood. Long: Maintain a healthy mental state.    Continue Psychosocial Services  Follow up required by staff              Psychosocial Re-Evaluation:  Psychosocial Re-Evaluation     Row Name 02/27/22 1023             Psychosocial Re-Evaluation   Current issues with None Identified       Comments Patient reports that she does have some trouble getting back to sleep around 3 am, but for the most part sleeps well and has no new stress concerns.       Expected Outcomes Short: continue to attend pulmonary rehab for mental health benefits. Long: maintain good mental health habits independently       Interventions Encouraged to attend Pulmonary Rehabilitation for the exercise       Continue Psychosocial Services  Follow up required by staff                Psychosocial Discharge (Final Psychosocial Re-Evaluation):  Psychosocial Re-Evaluation - 02/27/22 1023       Psychosocial Re-Evaluation   Current issues with None Identified    Comments Patient reports that she does have some trouble getting back to sleep around 3 am, but for the most part sleeps well and has no new stress concerns.    Expected Outcomes Short: continue to attend pulmonary rehab for mental health benefits. Long: maintain good mental health habits independently    Interventions Encouraged to attend Pulmonary Rehabilitation for the exercise    Continue Psychosocial Services  Follow up required by staff             Education: Education Goals: Education classes will be provided on a weekly basis, covering required topics. Participant will state understanding/return demonstration of topics presented.  Learning Barriers/Preferences:  Learning Barriers/Preferences - 02/01/22 0909       Learning Barriers/Preferences   Learning Barriers None    Learning Preferences None             General Pulmonary Education Topics:  Infection Prevention: - Provides verbal and written material to individual with discussion of infection control including proper hand washing and proper equipment cleaning during exercise  session. Flowsheet Row  Pulmonary Rehab from 02/22/2022 in [facility] Cardiac and Pulmonary Rehab  Date 02/12/22  Educator [facility]  Instruction Review Code 1- Verbalizes Understanding       Falls Prevention: - Provides verbal and written material to individual with discussion of falls prevention and safety. Flowsheet Row Pulmonary Rehab from 02/22/2022 in [facility] Cardiac and Pulmonary Rehab  Date 02/12/22  Educator [facility]  Instruction Review Code 1- Verbalizes Understanding       Chronic Lung Disease Review: - Group verbal instruction with posters, models, PowerPoint presentations and videos,  to review new updates, new respiratory medications, new advancements in procedures and treatments. Providing information on websites and "800" numbers for continued self-education. Includes information about supplement oxygen, available portable oxygen systems, continuous and intermittent flow rates, oxygen safety, concentrators, and Medicare reimbursement for oxygen. Explanation of Pulmonary Drugs, including class, frequency, complications, importance of spacers, rinsing mouth after steroid MDI's, and proper cleaning methods for nebulizers. Review of basic lung anatomy and physiology related to function, structure, and complications of lung disease. Review of risk factors. Discussion about methods for diagnosing sleep apnea and types of masks and machines for OSA. Includes a review of the use of types of environmental controls: home humidity, furnaces, filters, dust mite/pet prevention, HEPA vacuums. Discussion about weather changes, air quality and the benefits of nasal washing. Instruction on Warning signs, infection symptoms, calling MD promptly, preventive modes, and value of vaccinations. Review of effective airway clearance, coughing and/or vibration techniques. Emphasizing that all should Create an Action Plan. Written material given at graduation.   AED/CPR: - Group verbal and written instruction with the use of  models to demonstrate the basic use of the AED with the basic ABC's of resuscitation.    Anatomy and Cardiac Procedures: - Group verbal and visual presentation and models provide information about basic cardiac anatomy and function. Reviews the testing methods done to diagnose heart disease and the outcomes of the test results. Describes the treatment choices: Medical Management, Angioplasty, or Coronary Bypass Surgery for treating various heart conditions including Myocardial Infarction, Angina, Valve Disease, and Cardiac Arrhythmias.  Written material given at graduation. Flowsheet Row Pulmonary Rehab from 02/22/2022 in [facility] Cardiac and Pulmonary Rehab  Education need identified 02/12/22       Medication Safety: - Group verbal and visual instruction to review commonly prescribed medications for heart and lung disease. Reviews the medication, class of the drug, and side effects. Includes the steps to properly store meds and maintain the prescription regimen.  Written material given at graduation.   Other: -Provides group and verbal instruction on various topics (see comments)   Knowledge Questionnaire Score:  Knowledge Questionnaire Score - 02/12/22 1526       Knowledge Questionnaire Score   Pre Score 22/26              Core Components/Risk Factors/Patient Goals at Admission:  Personal Goals and Risk Factors at Admission - 02/12/22 1523       Core Components/Risk Factors/Patient Goals on Admission    Weight Management Yes;Weight Maintenance    Intervention Weight Management: Develop a combined nutrition and exercise program designed to reach desired caloric intake, while maintaining appropriate intake of nutrient and fiber, sodium and fats, and appropriate energy expenditure required for the weight goal.;Weight Management: Provide education and appropriate resources to help participant work on and attain dietary goals.;Weight Management/Obesity: Establish reasonable short term  and long term weight goals.    Admit Weight 143 lb 11.2 oz (65.2 kg)    Goal  Weight: Short Term 143 lb 11.2 oz (65.2 kg)    Goal Weight: Long Term 143 lb 11.2 oz (65.2 kg)    Expected Outcomes Short Term: Continue to assess and modify interventions until short term weight is achieved;Long Term: Adherence to nutrition and physical activity/exercise program aimed toward attainment of established weight goal;Weight Maintenance: Understanding of the daily nutrition guidelines, which includes 25-35% calories from fat, 7% or less cal from saturated fats, less than  cholesterol, less than 1.5gm of sodium, & 5 or more servings of fruits and vegetables daily;Understanding recommendations for meals to include 15-35% energy as protein, 25-35% energy from fat, 35-60% energy from carbohydrates, less than  of dietary cholesterol, 20-35 gm of total fiber daily;Understanding of distribution of calorie intake throughout the day with the consumption of 4-5 meals/snacks    Improve shortness of breath with ADL's Yes    Intervention Provide education, individualized exercise plan and daily activity instruction to help decrease symptoms of SOB with activities of daily living.    Expected Outcomes Short Term: Improve cardiorespiratory fitness to achieve a reduction of symptoms when performing ADLs;Long Term: Be able to perform more ADLs without symptoms or delay the onset of symptoms    Heart Failure Yes    Intervention Provide a combined exercise and nutrition program that is supplemented with education, support and counseling about heart failure. Directed toward relieving symptoms such as shortness of breath, decreased exercise tolerance, and extremity edema.    Expected Outcomes Improve functional capacity of life;Short term: Attendance in program 2-3 days a week with increased exercise capacity. Reported lower sodium intake. Reported increased fruit and vegetable intake. Reports medication compliance.;Short term:  Daily weights obtained and reported for increase. Utilizing diuretic protocols set by physician.;Long term: Adoption of self-care skills and reduction of barriers for early signs and symptoms recognition and intervention leading to self-care maintenance.    Hypertension Yes    Intervention Provide education on lifestyle modifcations including regular physical activity/exercise, weight management, moderate sodium restriction and increased consumption of fresh fruit, vegetables, and low fat dairy, alcohol moderation, and smoking cessation.;Monitor prescription use compliance.    Expected Outcomes Short Term: Continued assessment and intervention until BP is < 140/32mm HG in hypertensive participants. < 130/55mm HG in hypertensive participants with diabetes, heart failure or chronic kidney disease.;Long Term: Maintenance of blood pressure at goal levels.    Lipids Yes    Intervention Provide education and support for participant on nutrition & aerobic/resistive exercise along with prescribed medications to achieve LDL 70mg , HDL >40mg .    Expected Outcomes Short Term: Participant states understanding of desired cholesterol values and is compliant with medications prescribed. Participant is following exercise prescription and nutrition guidelines.;Long Term: Cholesterol controlled with medications as prescribed, with individualized exercise RX and with personalized nutrition plan. Value goals: LDL < , HDL > 40 mg.             Education:Diabetes - Individual verbal and written instruction to review signs/symptoms of diabetes, desired ranges of glucose level fasting, after meals and with exercise. Acknowledge that pre and post exercise glucose checks will be done for 3 sessions at entry of program.   Know Your Numbers and Heart Failure: - Group verbal and visual instruction to discuss disease risk factors for cardiac and pulmonary disease and treatment options.  Reviews associated critical values  for Overweight/Obesity, Hypertension, Cholesterol, and Diabetes.  Discusses basics of heart failure: signs/symptoms and treatments.  Introduces Heart Failure Zone chart for action plan for heart failure.  Written material given at graduation. Flowsheet Row Pulmonary Rehab from 02/22/2022 in [facility] Cardiac and Pulmonary Rehab  Education need identified 02/12/22       Core Components/Risk Factors/Patient Goals Review:   Goals and Risk Factor Review     Row Name 02/27/22 1021             Core Components/Risk Factors/Patient Goals Review   Personal Goals Review Weight Management/Obesity;Heart Failure;Develop more efficient breathing techniques such as purse lipped breathing and diaphragmatic breathing and practicing self-pacing with activity.;Hypertension;Lipids;Improve shortness of breath with ADL's       Review Patient reports that she monitors BP at home and it varies depending on the day. She does report taking all meds as prescribed. He weight has been consistent.       Expected Outcomes Short: contiue to monitor BP and weight at home. Long: continue healthy behaviors to control cardiac risk factors.                Core Components/Risk Factors/Patient Goals at Discharge (Final Review):   Goals and Risk Factor Review - 02/27/22 1021       Core Components/Risk Factors/Patient Goals Review   Personal Goals Review Weight Management/Obesity;Heart Failure;Develop more efficient breathing techniques such as purse lipped breathing and diaphragmatic breathing and practicing self-pacing with activity.;Hypertension;Lipids;Improve shortness of breath with ADL's    Review Patient reports that she monitors BP at home and it varies depending on the day. She does report taking all meds as prescribed. He weight has been consistent.    Expected Outcomes Short: contiue to monitor BP and weight at home. Long: continue healthy behaviors to control cardiac risk factors.             ITP Comments:   ITP Comments     Row Name 02/01/22 0907 02/12/22 1507 02/13/22 0952 02/14/22 0842 02/22/22 1149   ITP Comments Virtual Visit completed. Patient informed on EP and RD appointment and 6 Minute walk test. Patient also informed of patient health questionnaires on My Chart. Patient Verbalizes understanding. Visit diagnosis can be found in [facility] 01/11/2022. Completed and gym orientation. Initial ITP created and sent for review to Dr. Jinny Sanders, Medical Director. First full day of exercise!  Patient was oriented to gym and equipment including functions, settings, policies, and procedures.  Patient's individual exercise prescription and treatment plan were reviewed.  All starting workloads were established based on the results of the 6 minute walk test done at initial orientation visit.  The plan for exercise progression was also introduced and progression will be customized based on patient's performance and goals. 30 Day review completed. Medical Director ITP review done, changes made as directed, and signed approval by Medical Director.  NEW Completed initial RD consultation    Row Name 03/14/22 1253           ITP Comments 30 Day review completed. Medical Director ITP review done, changes made as directed, and signed approval by Medical Director.                Comments:

## 2022-03-15 DIAGNOSIS — I5022 Chronic systolic (congestive) heart failure: Secondary | ICD-10-CM

## 2022-03-15 NOTE — Progress Notes (Signed)
Daily Session Note  Patient Details  Name: Latrese C Martinique MRN: [id number] Date of Birth: 02-Jul-1935 Referring Provider:   Flowsheet Row Pulmonary Rehab from 02/12/2022 in [facility] Cardiac and Pulmonary Rehab  Referring Provider Gollan       Encounter Date: 03/15/2022  Check In:  Session Check In - 03/15/22 1000       Check-In   Supervising physician immediately available to respond to emergencies See telemetry face sheet for immediately available ER MD    Location [facility]-Cardiac & Pulmonary Rehab    Staff Present Vida Rigger, RN, BSN;Joseph Hood, RCP,RRT,BSRT;Melissa Lenwood, [place], Tawanna Solo, MS, ASCM CEP, Exercise Physiologist    Virtual Visit No    Medication changes reported     No    Fall or balance concerns reported    No    Warm-up and Cool-down Performed on first and last piece of equipment    Resistance Training Performed Yes    VAD Patient? No    PAD/SET Patient? No      Pain Assessment   Currently in Pain? No/denies                Social History   Tobacco Use  Smoking Status Never  Smokeless Tobacco Never    Goals Met:  Proper associated with RPD/PD & O2 Sat Independence with exercise equipment Using PLB without cueing & demonstrates good technique Exercise tolerated well No report of concerns or symptoms today Strength training completed today  Goals Unmet:  Not Applicable  Comments: Pt able to follow exercise prescription today without complaint.  Will continue to monitor for progression.   Dr. Emily Filbert is Medical Director for [place].  Dr. Ottie Glazier is Medical Director for [facility] Pulmonary Rehabilitation.

## 2022-03-20 DIAGNOSIS — I5022 Chronic systolic (congestive) heart failure: Secondary | ICD-10-CM | POA: Diagnosis not present

## 2022-03-20 NOTE — Progress Notes (Signed)
Daily Session Note  Patient Details  Name: Haniyyah C Martinique MRN: [id number] Date of Birth: December 03, 1934 Referring Provider:   Flowsheet Row Pulmonary Rehab from 02/12/2022 in [facility] Cardiac and Pulmonary Rehab  Referring Provider Gollan       Encounter Date: 03/20/2022  Check In:  Session Check In - 03/20/22 1000       Check-In   Supervising physician immediately available to respond to emergencies See telemetry face sheet for immediately available ER MD    Location [facility]-Cardiac & Pulmonary Rehab    Staff Present Earlean Shawl, BS, ACSM CEP, Exercise Physiologist;Kimora Stankovic Rosalia Hammers, MPA, RN;Melissa Caiola, RDN, LDN    Virtual Visit No    Medication changes reported     No    Fall or balance concerns reported    No    Warm-up and Cool-down Performed on first and last piece of equipment    Resistance Training Performed Yes    VAD Patient? No    PAD/SET Patient? No      Pain Assessment   Currently in Pain? No/denies                Social History   Tobacco Use  Smoking Status Never  Smokeless Tobacco Never    Goals Met:  Independence with exercise equipment Exercise tolerated well No report of concerns or symptoms today Strength training completed today  Goals Unmet:  Not Applicable  Comments: Pt able to follow exercise prescription today without complaint.  Will continue to monitor for progression.    Dr. Emily Filbert is Medical Director for [place].  Dr. Ottie Glazier is Medical Director for [facility] Pulmonary Rehabilitation.

## 2022-03-22 DIAGNOSIS — I5022 Chronic systolic (congestive) heart failure: Secondary | ICD-10-CM | POA: Diagnosis not present

## 2022-03-22 NOTE — Progress Notes (Signed)
Daily Session Note  Patient Details  Name: Robin Ortiz MRN: [id number] Date of Birth: 21-Jul-1935 Referring Provider:   Flowsheet Row Pulmonary Rehab from 02/12/2022 in [facility] Cardiac and Pulmonary Rehab  Referring Provider Gollan       Encounter Date: 03/22/2022  Check In:  Session Check In - 03/22/22 0946       Check-In   Supervising physician immediately available to respond to emergencies See telemetry face sheet for immediately available ER MD    Location [facility]-Cardiac & Pulmonary Rehab    Staff Present Carson Myrtle, BS, RRT, CPFT;Joseph Karie Fetch, MPA, RN    Virtual Visit No    Medication changes reported     No    Fall or balance concerns reported    No    Warm-up and Cool-down Performed on first and last piece of equipment    Resistance Training Performed Yes    VAD Patient? No    PAD/SET Patient? No      Pain Assessment   Currently in Pain? No/denies                Social History   Tobacco Use  Smoking Status Never  Smokeless Tobacco Never    Goals Met:  Independence with exercise equipment Exercise tolerated well No report of concerns or symptoms today Strength training completed today  Goals Unmet:  Not Applicable  Comments: Pt able to follow exercise prescription today without complaint.  Will continue to monitor for progression.    Dr. Emily Filbert is Medical Director for [place].  Dr. Ottie Glazier is Medical Director for [facility] Pulmonary Rehabilitation.

## 2022-03-27 DIAGNOSIS — I5022 Chronic systolic (congestive) heart failure: Secondary | ICD-10-CM | POA: Diagnosis not present

## 2022-04-05 ENCOUNTER — Encounter: Payer: Medicare Other | Attending: Cardiovascular Disease

## 2022-04-05 DIAGNOSIS — Z5189 Encounter for other specified aftercare: Secondary | ICD-10-CM | POA: Insufficient documentation

## 2022-04-05 DIAGNOSIS — I5022 Chronic systolic (congestive) heart failure: Secondary | ICD-10-CM | POA: Insufficient documentation

## 2022-04-05 NOTE — Progress Notes (Signed)
Daily Session Note  Patient Details  Name: Robin Ortiz MRN: [id number] Date of Birth: 1935-05-24 Referring Provider:   Flowsheet Row Pulmonary Rehab from 02/12/2022 in [facility] Cardiac and Pulmonary Rehab  Referring Provider Gollan       Encounter Date: 04/05/2022  Check In:  Session Check In - 04/05/22 0952       Check-In   Supervising physician immediately available to respond to emergencies See telemetry face sheet for immediately available ER MD    Location [facility]-Cardiac & Pulmonary Rehab    Staff Present Antionette Fairy, BS, Exercise Physiologist;Joseph Woodruff, RCP,RRT,BSRT;Kelly [place], MPA, RN;Melissa [place], RDN, LDN    Virtual Visit No    Medication changes reported     No    Fall or balance concerns reported    No    Tobacco Cessation No Change    Warm-up and Cool-down Performed on first and last piece of equipment    Resistance Training Performed Yes    VAD Patient? No    PAD/SET Patient? No      Pain Assessment   Currently in Pain? No/denies    Multiple Pain Sites No                Social History   Tobacco Use  Smoking Status Never  Smokeless Tobacco Never    Goals Met:  Independence with exercise equipment Exercise tolerated well No report of concerns or symptoms today Strength training completed today  Goals Unmet:  Not Applicable  Comments: Pt able to follow exercise prescription today without complaint.  Will continue to monitor for progression.    Dr. Emily Filbert is Medical Director for [place].  Dr. Ottie Glazier is Medical Director for [facility] Pulmonary Rehabilitation.

## 2022-04-10 DIAGNOSIS — I5022 Chronic systolic (congestive) heart failure: Secondary | ICD-10-CM

## 2022-04-10 NOTE — Progress Notes (Signed)
Daily Session Note  Patient Details  Name: Robin Ortiz MRN: [id number] Date of Birth: 06/02/1935 Referring Provider:   Flowsheet Row Pulmonary Rehab from 02/12/2022 in [facility] Cardiac and Pulmonary Rehab  Referring Provider Gollan       Encounter Date: 04/10/2022  Check In:  Session Check In - 04/10/22 1018       Check-In   Supervising physician immediately available to respond to emergencies See telemetry face sheet for immediately available ER MD    Location [facility]-Cardiac & Pulmonary Rehab    Staff Present Earlean Shawl, BS, ACSM CEP, Exercise Physiologist;Jessica Luan Pulling, MA, RCEP, CCRP, Kathaleen Maser, MPA, RN    Virtual Visit No    Medication changes reported     No    Fall or balance concerns reported    No    Tobacco Cessation No Change    Warm-up and Cool-down Performed on first and last piece of equipment    Resistance Training Performed Yes    VAD Patient? No    PAD/SET Patient? No      Pain Assessment   Currently in Pain? No/denies                Social History   Tobacco Use  Smoking Status Never  Smokeless Tobacco Never    Goals Met:  Independence with exercise equipment Exercise tolerated well No report of concerns or symptoms today Strength training completed today  Goals Unmet:  Not Applicable  Comments: Pt able to follow exercise prescription today without complaint.  Will continue to monitor for progression.    Dr. Emily Filbert is Medical Director for [place].  Dr. Ottie Glazier is Medical Director for [facility] Pulmonary Rehabilitation.

## 2022-04-11 ENCOUNTER — Encounter: Payer: Self-pay | Admitting: *Deleted

## 2022-04-11 DIAGNOSIS — I5022 Chronic systolic (congestive) heart failure: Secondary | ICD-10-CM

## 2022-04-11 NOTE — Progress Notes (Signed)
Pulmonary Individual Treatment Plan  Patient Details  Name: Robin Ortiz MRN: [id number] Date of Birth: [birth year] Referring Provider:   Flowsheet Row Pulmonary Rehab from 02/12/2022 in [facility] Cardiac and Pulmonary Rehab  Referring Provider Gollan       Initial Encounter Date:  Flowsheet Row Pulmonary Rehab from 02/12/2022 in [facility] Cardiac and Pulmonary Rehab  Date 02/12/22       Visit Diagnosis: Heart failure, chronic systolic ([place])  Patient's Home Medications on Admission:  Current Outpatient Medications:    amiodarone (PACERONE) 200 MG tablet, Take 1 tablet (200 mg total) by mouth daily., Disp: 90 tablet, Rfl: 3   apixaban (ELIQUIS) 5 MG TABS tablet, Take 1 tablet (5 mg total) by mouth 2 (two) times daily., Disp: 60 tablet, Rfl: 6   Calcium-Vitamin D 600-200 MG-UNIT per tablet, Take 1 tablet by mouth daily., Disp: , Rfl:    carvedilol (COREG) 6.25 MG tablet, Take 1 tablet (6.25 mg total) by mouth 2 (two) times daily., Disp: 180 tablet, Rfl: 3   celecoxib (CELEBREX) 200 MG capsule, Take 200 mg by mouth daily as needed for mild pain., Disp: , Rfl:    diclofenac Sodium (VOLTAREN) 1 % GEL, Apply 2 g topically daily as needed (Arthritis)., Disp: , Rfl:    diphenhydrAMINE-APAP, sleep, (TYLENOL PM EXTRA STRENGTH PO), Take 1 tablet by mouth at bedtime as needed (sleep)., Disp: , Rfl:    furosemide (LASIX) 40 MG tablet, Take 0.5 tablets (20 mg total) by mouth as directed. Take half a tablet once daily and if weight gain of 3 pounds in 24 hours or 5 pounds in one week you may take additional 20 mg., Disp: 30 tablet, Rfl: 3   Misc Natural Products (OSTEO BI-FLEX ADV JOINT SHIELD PO), Take 1 tablet by mouth daily., Disp: , Rfl:    Multiple Vitamin (MULTIVITAMIN) tablet, Take 1 tablet by mouth daily. Alive, Disp: , Rfl:    potassium chloride (KLOR-CON) 10 MEQ tablet, Take 1 tablet (10 mEq total) by mouth every Monday, Wednesday, and Friday. (Patient taking differently: Take 10 mEq by mouth  daily.), Disp: 39 tablet, Rfl: 3   raloxifene (EVISTA) 60 MG tablet, Take 60 mg by mouth daily., Disp: , Rfl:    sacubitril-valsartan (ENTRESTO) 97-103 MG, Take 1 tablet by mouth 2 (two) times daily., Disp: 60 tablet, Rfl: 3   simvastatin (ZOCOR) 20 MG tablet, Take 20 mg by mouth at bedtime., Disp: , Rfl:   Past Medical History: Past Medical History:  Diagnosis Date   Anxiety    Cardiomegaly    Chronic fatigue    Chronic HFmrEF (heart failure with midrange ejection fraction) ([place])    a. 2002 Echo: EF 20-30%; b. 09/2015 Echo: EF 45-50%, diff HK. Mild MR. Mod dil LA; c. 10/2021 Echo: EF 35-40% (in setting of afib), no rwma, low-nl RV fxn, sev dil LA, mildly dil RA, mod MR, mild-mod TR.   Essential hypertension    Mixed hyperlipidemia    NICM (nonischemic cardiomyopathy) ([facility])    Osteoporosis    Persistent atrial fibrillation ([facility])    a. Dx 10/2021. CHA2DS2VASc = 5-->Eliquis; b. 11/2021 s/p DCCV (120J).    Tobacco Use: Social History   Tobacco Use  Smoking Status Never  Smokeless Tobacco Never    Labs: Review Flowsheet        No data to display           Pulmonary Assessment Scores:  Pulmonary Assessment Scores     Row Name 02/12/22 1526  ADL UCSD   ADL Phase Entry     SOB Score total 53     Rest 3     Walk 2     Stairs 3     Bath 2     Dress 2     Shop 2       CAT Score   CAT Score 14       mMRC Score   mMRC Score 2              UCSD: Self-administered rating of dyspnea associated with activities of daily living (ADLs) 6-point scale (0 = "not at all" to 5 = "maximal or unable to do because of breathlessness")  Scoring Scores range from 0 to 120.  Minimally important difference is 5 units  CAT: CAT can identify the health impairment of COPD patients and is better correlated with disease progression.  CAT has a scoring range of zero to 40. The CAT score is classified into four groups of low (less than 10), medium (10 - 20), high (21-30) and  very high (31-40) based on the impact level of disease on health status. A CAT score over 10 suggests significant symptoms.  A worsening CAT score could be explained by an exacerbation, poor medication adherence, poor inhaler technique, or progression of COPD or comorbid conditions.  CAT MCID is 2 points  mMRC: mMRC (Modified Medical Research Council) Dyspnea Scale is used to assess the degree of baseline functional disability in patients of respiratory disease due to dyspnea. No minimal important difference is established. A decrease in score of 1 point or greater is considered a positive change.   Pulmonary Function Assessment:  Pulmonary Function Assessment - 02/01/22 0909       Breath   Shortness of Breath No             Exercise Target Goals: Exercise Program Goal: Individual exercise prescription set using results from initial 6 min walk test and THRR while considering  patient's activity barriers and safety.   Exercise Prescription Goal: Initial exercise prescription builds to 30-45 minutes a day of aerobic activity, 2-3 days per week.  Home exercise guidelines will be given to patient during program as part of exercise prescription that the participant will acknowledge.  Education: Aerobic Exercise: - Group verbal and visual presentation on the components of exercise prescription. Introduces F.I.T.T principle from ACSM for exercise prescriptions.  Reviews F.I.T.T. principles of aerobic exercise including progression. Written material given at graduation. Flowsheet Row Pulmonary Rehab from 04/05/2022 in [facility] Cardiac and Pulmonary Rehab  Education need identified 02/12/22       Education: Resistance Exercise: - Group verbal and visual presentation on the components of exercise prescription. Introduces F.I.T.T principle from ACSM for exercise prescriptions  Reviews F.I.T.T. principles of resistance exercise including progression. Written material given at graduation.     Education: Exercise & Equipment Safety: - Individual verbal instruction and demonstration of equipment use and safety with use of the equipment. Flowsheet Row Pulmonary Rehab from 04/05/2022 in [facility] Cardiac and Pulmonary Rehab  Date 02/12/22  Educator [facility]  Instruction Review Code 1- Verbalizes Understanding       Education: Exercise Physiology & General Exercise Guidelines: - Group verbal and written instruction with models to review the exercise physiology of the cardiovascular system and associated critical values. Provides general exercise guidelines with specific guidelines to those with heart or lung disease.    Education: Flexibility, Balance, Mind/Body Relaxation: - Group verbal and visual presentation with  interactive activity on the components of exercise prescription. Introduces F.I.T.T principle from ACSM for exercise prescriptions. Reviews F.I.T.T. principles of flexibility and balance exercise training including progression. Also discusses the mind body connection.  Reviews various relaxation techniques to help reduce and manage stress (i.e. Deep breathing, progressive muscle relaxation, and visualization). Balance handout provided to take home. Written material given at graduation. Flowsheet Row Pulmonary Rehab from 04/05/2022 in [facility] Cardiac and Pulmonary Rehab  Date 02/15/22  Educator [facility]  Instruction Review Code 1- Verbalizes Understanding       Activity Barriers & Risk Stratification:   6 Minute Walk:  6 Minute Walk     Row Name 02/12/22 1512         6 Minute Walk   Phase Initial     Distance 860 feet     Walk Time 6 minutes     # of Rest Breaks 0     MPH 1.63     METS 1.48     RPE 13     Perceived Dyspnea  2     VO2 Peak 5.17     Symptoms Yes (comment)     Comments left hip pain when walking 3/10     Resting HR 63 bpm     Resting BP 140/80     Resting Oxygen Saturation  98 %     Exercise Oxygen Saturation  during 6 min walk 95 %     Max Ex. HR 75 bpm      Max Ex. BP 166/88     2 Minute Post BP 158/84       Interval HR   1 Minute HR 70     2 Minute HR 75     3 Minute HR 75     4 Minute HR 75     5 Minute HR 75     6 Minute HR 76     2 Minute Post HR 59     Interval Heart Rate? Yes       Interval Oxygen   Interval Oxygen? Yes     Baseline Oxygen Saturation % 98 %     1 Minute Oxygen Saturation % 96 %     1 Minute Liters of Oxygen 0 L     2 Minute Oxygen Saturation % 95 %     2 Minute Liters of Oxygen 0 L     3 Minute Oxygen Saturation % 96 %     3 Minute Liters of Oxygen 0 L     4 Minute Oxygen Saturation % 97 %     4 Minute Liters of Oxygen 0 L     5 Minute Oxygen Saturation % 96 %     5 Minute Liters of Oxygen 0 L     6 Minute Oxygen Saturation % 96 %     6 Minute Liters of Oxygen 0 L     2 Minute Post Oxygen Saturation % 98 %     2 Minute Post Liters of Oxygen 0 L             Oxygen Initial Assessment:  Oxygen Initial Assessment - 02/12/22 1649       Home Oxygen   Home Oxygen Device None    Sleep Oxygen Prescription None    Home Exercise Oxygen Prescription None    Home Resting Oxygen Prescription None    Compliance with Home Oxygen Use Yes      Initial 6 min Walk  Oxygen Used None      Program Oxygen Prescription   Program Oxygen Prescription None      Intervention   Short Term Goals To learn and understand importance of monitoring SPO2 with pulse oximeter and demonstrate accurate use of the pulse oximeter.;To learn and understand importance of maintaining oxygen saturations>88%;To learn and demonstrate proper pursed lip breathing techniques or other breathing techniques.     Long  Term Goals Maintenance of O2 saturations>88%;Exhibits proper breathing techniques, such as pursed lip breathing or other method taught during program session;Verbalizes importance of monitoring SPO2 with pulse oximeter and return demonstration             Oxygen Re-Evaluation:  Oxygen Re-Evaluation     Row Name  02/13/22 0953 02/27/22 1017 04/05/22 1017         Program Oxygen Prescription   Program Oxygen Prescription None None None       Home Oxygen   Home Oxygen Device None None None     Sleep Oxygen Prescription None None None     Home Exercise Oxygen Prescription None None None     Home Resting Oxygen Prescription None None None     Compliance with Home Oxygen Use Yes Yes Yes       Goals/Expected Outcomes   Short Term Goals To learn and understand importance of monitoring SPO2 with pulse oximeter and demonstrate accurate use of the pulse oximeter.;To learn and understand importance of maintaining oxygen saturations>88%;To learn and demonstrate proper pursed lip breathing techniques or other breathing techniques.  To learn and understand importance of monitoring SPO2 with pulse oximeter and demonstrate accurate use of the pulse oximeter.;To learn and understand importance of maintaining oxygen saturations>88%;To learn and demonstrate proper pursed lip breathing techniques or other breathing techniques.  To learn and understand importance of monitoring SPO2 with pulse oximeter and demonstrate accurate use of the pulse oximeter.;To learn and understand importance of maintaining oxygen saturations>88%;To learn and demonstrate proper pursed lip breathing techniques or other breathing techniques.      Long  Term Goals Maintenance of O2 saturations>88%;Exhibits proper breathing techniques, such as pursed lip breathing or other method taught during program session;Verbalizes importance of monitoring SPO2 with pulse oximeter and return demonstration Maintenance of O2 saturations>88%;Exhibits proper breathing techniques, such as pursed lip breathing or other method taught during program session;Verbalizes importance of monitoring SPO2 with pulse oximeter and return demonstration Maintenance of O2 saturations>88%;Exhibits proper breathing techniques, such as pursed lip breathing or other method taught during  program session;Verbalizes importance of monitoring SPO2 with pulse oximeter and return demonstration     Comments Reviewed PLB technique with pt.  Talked about how it works and it's importance in maintaining their exercise saturations. Patient reports using PLB to help control SOB during exercise. Patient was encouraged to monitor SaO2 during exercise while exercising at home. Patient reports continuing to use PLB to help control shortness of breath during exercise and reports using it frequently. Patient was encouraged to monitor O2 during exercise while exercising at home.     Goals/Expected Outcomes Short: Become more profiecient at using PLB.   Long: Become independent at using PLB. Short: Continue to use PBL and start monitoring SaO2 at home during exercise. Long: Become independent with PLB and exercise program. Short: Continue to use PBL and start monitoring O2 at home during exercise. Long: Become independent with PLB and exercise program.              Oxygen Discharge (Final Oxygen Re-Evaluation):  Oxygen Re-Evaluation - 04/05/22 1017       Program Oxygen Prescription   Program Oxygen Prescription None      Home Oxygen   Home Oxygen Device None    Sleep Oxygen Prescription None    Home Exercise Oxygen Prescription None    Home Resting Oxygen Prescription None    Compliance with Home Oxygen Use Yes      Goals/Expected Outcomes   Short Term Goals To learn and understand importance of monitoring SPO2 with pulse oximeter and demonstrate accurate use of the pulse oximeter.;To learn and understand importance of maintaining oxygen saturations>88%;To learn and demonstrate proper pursed lip breathing techniques or other breathing techniques.     Long  Term Goals Maintenance of O2 saturations>88%;Exhibits proper breathing techniques, such as pursed lip breathing or other method taught during program session;Verbalizes importance of monitoring SPO2 with pulse oximeter and return  demonstration    Comments Patient reports continuing to use PLB to help control shortness of breath during exercise and reports using it frequently. Patient was encouraged to monitor O2 during exercise while exercising at home.    Goals/Expected Outcomes Short: Continue to use PBL and start monitoring O2 at home during exercise. Long: Become independent with PLB and exercise program.             Initial Exercise Prescription:  Initial Exercise Prescription - 02/12/22 1500       Date of Initial Exercise RX and Referring Provider   Date 02/12/22    Referring Provider Gollan      Oxygen   Maintain Oxygen Saturation 88% or higher      Treadmill   MPH 0.8    Grade 0    Minutes 15    METs 1.48      NuStep   Level 1    SPM 80    Minutes 15    METs 1.48      Biostep-RELP   Level 1    SPM 50    Minutes 15    METs 1.48      Prescription Details   Frequency (times per week) 2    Duration Progress to 30 minutes of continuous aerobic without signs/symptoms of physical distress      Intensity   THRR 40-80% of Max Heartrate 92-121    Ratings of Perceived Exertion 11-13    Perceived Dyspnea 0-4      Progression   Progression Continue to progress workloads to maintain intensity without signs/symptoms of physical distress.      Resistance Training   Training Prescription Yes    Weight 2    Reps 10-15             Perform Capillary Blood Glucose checks as needed.  Exercise Prescription Changes:   Exercise Prescription Changes     Row Name 02/12/22 1500 02/22/22 1200 02/27/22 1000 03/07/22 0800 03/19/22 1600     Response to Exercise   Blood Pressure (Admit) 140/80 124/62 -- 120/58 148/76   Blood Pressure (Exercise) 166/88 -- -- 160/78 136/78   Blood Pressure (Exit) 120/60 132/68 -- 90/56 146/68   Heart Rate (Admit) 63 bpm 62 bpm -- 50 bpm 52 bpm   Heart Rate (Exercise) 76 bpm 65 bpm -- 83 bpm 69 bpm   Heart Rate (Exit) 49 bpm 60 bpm -- 60 bpm 52 bpm   Oxygen  Saturation (Admit) 98 % 97 % -- 96 % 98 %   Oxygen Saturation (Exercise) 95 % 94 % -- 84 %  91 %   Oxygen Saturation (Exit) 96 % 97 % -- 98 % 97 %   Rating of Perceived Exertion (Exercise) 13 15 -- 13 13   Perceived Dyspnea (Exercise) 2 2 -- 1 0   Symptoms 3/10 hip pain with walking SOB -- none none   Comments 6 MWT results -- -- -- --   Duration -- Progress to 30 minutes of  aerobic without signs/symptoms of physical distress Progress to 30 minutes of  aerobic without signs/symptoms of physical distress Progress to 30 minutes of  aerobic without signs/symptoms of physical distress Continue with 30 min of aerobic exercise without signs/symptoms of physical distress.   Intensity -- THRR unchanged THRR unchanged THRR unchanged THRR unchanged     Progression   Progression -- Continue to progress workloads to maintain intensity without signs/symptoms of physical distress. Continue to progress workloads to maintain intensity without signs/symptoms of physical distress. Continue to progress workloads to maintain intensity without signs/symptoms of physical distress. Continue to progress workloads to maintain intensity without signs/symptoms of physical distress.   Average METs -- 1.86 1.86 1.81 1.9     Resistance Training   Training Prescription -- Yes Yes Yes Yes   Weight -- 2 lb 2 lb 2 lb 2 lb   Reps -- 10-15 10-15 10-15 10-15     Interval Training   Interval Training -- No No No No     Treadmill   MPH -- 0.8 0.8 1.4 0.9   Grade -- 0 0 0 0   Minutes -- 15 15 15 15    METs -- 1.6 1.6 2.07 1.69     NuStep   Level -- 1 1 2 2    Minutes -- 15 15 15 15    METs -- 2 2 2  2.4     Biostep-RELP   Level -- 1 1 2  --   Minutes -- 15 15 15  --   METs -- 2 2 1.2 --     Home Exercise Plan   Plans to continue exercise at -- -- Home (comment)  walking at home outside or on TM and continue to do PT exercises. Home (comment)  walking at home outside or on TM and continue to do PT exercises. --   Frequency  -- -- Add 2 additional days to program exercise sessions. Add 2 additional days to program exercise sessions. --   Initial Home Exercises Provided -- -- 02/27/22 02/27/22 --     Oxygen   Maintain Oxygen Saturation -- 88% or higher 88% or higher 88% or higher 88% or higher    Row Name 04/02/22 0900             Response to Exercise   Blood Pressure (Admit) 118/70       Blood Pressure (Exercise) 140/68       Blood Pressure (Exit) 108/56       Heart Rate (Admit) 62 bpm       Heart Rate (Exercise) 76 bpm       Heart Rate (Exit) 56 bpm       Oxygen Saturation (Admit) 98 %       Oxygen Saturation (Exercise) 94 %       Oxygen Saturation (Exit) 97 %       Rating of Perceived Exertion (Exercise) 13       Perceived Dyspnea (Exercise) 0       Symptoms none       Duration Continue with 30 min of aerobic exercise without  signs/symptoms of physical distress.       Intensity THRR unchanged         Progression   Progression Continue to progress workloads to maintain intensity without signs/symptoms of physical distress.       Average METs 1.85         Resistance Training   Training Prescription Yes       Weight 2 lb       Reps 10-15         Interval Training   Interval Training No         Treadmill   MPH 1.3       Grade 0       Minutes 15       METs 2         NuStep   Level 3       Minutes 15       METs 1.8         Biostep-RELP   Level 1       Minutes 15       METs 2         Home Exercise Plan   Plans to continue exercise at Home (comment)  walking at home outside or on TM and continue to do PT exercises.       Frequency Add 2 additional days to program exercise sessions.       Initial Home Exercises Provided 02/27/22         Oxygen   Maintain Oxygen Saturation 88% or higher                Exercise Comments:   Exercise Comments     Row Name 02/13/22 3220           Exercise Comments First full day of exercise!  Patient was oriented to gym and equipment  including functions, settings, policies, and procedures.  Patient's individual exercise prescription and treatment plan were reviewed.  All starting workloads were established based on the results of the 6 minute walk test done at initial orientation visit.  The plan for exercise progression was also introduced and progression will be customized based on patient's performance and goals.                Exercise Goals and Review:   Exercise Goals     Row Name 02/12/22 1519             Exercise Goals   Increase Physical Activity Yes       Intervention Provide advice, education, support and counseling about physical activity/exercise needs.;Develop an individualized exercise prescription for aerobic and resistive training based on initial evaluation findings, risk stratification, comorbidities and participant's personal goals.       Expected Outcomes Short Term: Attend rehab on a regular basis to increase amount of physical activity.;Long Term: Add in home exercise to make exercise part of routine and to increase amount of physical activity.;Long Term: Exercising regularly at least 3-5 days a week.       Increase Strength and Stamina Yes       Intervention Provide advice, education, support and counseling about physical activity/exercise needs.;Develop an individualized exercise prescription for aerobic and resistive training based on initial evaluation findings, risk stratification, comorbidities and participant's personal goals.       Expected Outcomes Short Term: Increase workloads from initial exercise prescription for resistance, speed, and METs.;Short Term: Perform resistance training exercises routinely during rehab and add in resistance training at home;Long  Term: Improve cardiorespiratory fitness, muscular endurance and strength as measured by increased METs and functional capacity (6MWT)       Able to understand and use rate of perceived exertion (RPE) scale Yes       Intervention  Provide education and explanation on how to use RPE scale       Expected Outcomes Short Term: Able to use RPE daily in rehab to express subjective intensity level;Long Term:  Able to use RPE to guide intensity level when exercising independently       Able to understand and use Dyspnea scale Yes       Intervention Provide education and explanation on how to use Dyspnea scale       Expected Outcomes Short Term: Able to use Dyspnea scale daily in rehab to express subjective sense of shortness of breath during exertion;Long Term: Able to use Dyspnea scale to guide intensity level when exercising independently       Knowledge and understanding of Target Heart Rate Range (THRR) Yes       Intervention Provide education and explanation of THRR including how the numbers were predicted and where they are located for reference       Expected Outcomes Short Term: Able to state/look up THRR;Long Term: Able to use THRR to govern intensity when exercising independently;Short Term: Able to use daily as guideline for intensity in rehab       Able to check pulse independently Yes       Intervention Provide education and demonstration on how to check pulse in carotid and radial arteries.;Review the importance of being able to check your own pulse for safety during independent exercise       Expected Outcomes Short Term: Able to explain why pulse checking is important during independent exercise;Long Term: Able to check pulse independently and accurately       Understanding of Exercise Prescription Yes       Intervention Provide education, explanation, and written materials on patient's individual exercise prescription       Expected Outcomes Short Term: Able to explain program exercise prescription;Long Term: Able to explain home exercise prescription to exercise independently                Exercise Goals Re-Evaluation :  Exercise Goals Re-Evaluation     Row Name 02/13/22 1610 02/22/22 1241 02/27/22 1000  03/07/22 0847 03/19/22 1608     Exercise Goal Re-Evaluation   Exercise Goals Review Increase Physical Activity;Able to understand and use rate of perceived exertion (RPE) scale;Knowledge and understanding of Target Heart Rate Range (THRR);Understanding of Exercise Prescription;Able to understand and use Dyspnea scale;Able to check pulse independently;Increase Strength and Stamina Increase Physical Activity;Increase Strength and Stamina;Understanding of Exercise Prescription Increase Physical Activity;Increase Strength and Stamina;Able to understand and use rate of perceived exertion (RPE) scale;Able to understand and use Dyspnea scale;Knowledge and understanding of Target Heart Rate Range (THRR);Able to check pulse independently;Understanding of Exercise Prescription Increase Physical Activity;Increase Strength and Stamina;Understanding of Exercise Prescription Increase Physical Activity;Increase Strength and Stamina;Understanding of Exercise Prescription   Comments Reviewed RPE and dyspnea scales, THR and program prescription with pt today.  Pt voiced understanding and was given a copy of goals to take home. Monya is off to a good start in rehab. She has completed her first four full days of exercise thus far.  We will continue to montior her progress. Reviewed home exercise with pt today.  Pt plans to walk and do PT exercises for exercise.  Reviewed THR, pulse, RPE, sign and symptoms, pulse oximetery and when to call 911 or MD.  Also discussed weather considerations and indoor options.  Pt voiced understanding. Klyn has been doing well in rehab. She improved to level 2 on the biostep machine, and the T4 NuStep. She has also increased her speed on the treadmill to 1.4 mph. She has tolerated using 2 lb hand weights for resistance training. We will continue to monitor her progress in the program. Joleah continues to do well in rehab. She was out of town the last week and only came for a couple sessions  during this review. Walking on the treadmill is a challenge for her and we hope to see her increase her speed over time. She would benefit from increasing her handweights to 3 lbs. Will continue to monitor.   Expected Outcomes Short: Use RPE daily to regulate intensity. Long: Follow program prescription in THR. Short: Continue to attend rehab regularly Long: Continue to follow program prescription Short: add 1-2 days of exercise at home on off days of class. Long: become independent with exercise routine. Short: continue to increase speed on treadmill. Long: Continue to increase strength and stamina. Short: Increase handweights to 3 lbs Long: Continue to increase overall MET level    [place] Name 04/02/22 0940 04/05/22 1014           Exercise Goal Re-Evaluation   Exercise Goals Review Increase Physical Activity;Increase Strength and Stamina;Understanding of Exercise Prescription Increase Physical Activity;Increase Strength and Stamina;Understanding of Exercise Prescription      Comments Rashelle is doing well in rehab. She improved to level 3 on the T4. She also worked her speed back up to 1.3 mph on the treadmill. Thurza is up to 2 METs on the BioStep as well. We will continue to monitor her progress in the program. Georgianna reports staying active, but not doing any exercise outside of rehab. She reports she is not as strong as she used to be. Reviewed that she could use her PT exercises and walking for exercise outside of rehab as she discussed with EP when going over home exercise. She reports that she has used cans of soup in the past for weighted exercise.      Expected Outcomes Short: Increase handweights to 3 lbs Long: Continue to improve strength and stamina. Short: begin structured exercise outside of rehab Long: Continue to improve strength and stamina.               Discharge Exercise Prescription (Final Exercise Prescription Changes):  Exercise Prescription Changes - 04/02/22 0900        Response to Exercise   Blood Pressure (Admit) 118/70    Blood Pressure (Exercise) 140/68    Blood Pressure (Exit) 108/56    Heart Rate (Admit) 62 bpm    Heart Rate (Exercise) 76 bpm    Heart Rate (Exit) 56 bpm    Oxygen Saturation (Admit) 98 %    Oxygen Saturation (Exercise) 94 %    Oxygen Saturation (Exit) 97 %    Rating of Perceived Exertion (Exercise) 13    Perceived Dyspnea (Exercise) 0    Symptoms none    Duration Continue with 30 min of aerobic exercise without signs/symptoms of physical distress.    Intensity THRR unchanged      Progression   Progression Continue to progress workloads to maintain intensity without signs/symptoms of physical distress.    Average METs 1.85      Resistance Training   Training Prescription  Yes    Weight 2 lb    Reps 10-15      Interval Training   Interval Training No      Treadmill   MPH 1.3    Grade 0    Minutes 15    METs 2      NuStep   Level 3    Minutes 15    METs 1.8      Biostep-RELP   Level 1    Minutes 15    METs 2      Home Exercise Plan   Plans to continue exercise at Home (comment)   walking at home outside or on TM and continue to do PT exercises.   Frequency Add 2 additional days to program exercise sessions.    Initial Home Exercises Provided 02/27/22      Oxygen   Maintain Oxygen Saturation 88% or higher             Nutrition:  Target Goals: Understanding of nutrition guidelines, daily intake of sodium <1527m, cholesterol <2073m calories 30% from fat and 7% or less from saturated fats, daily to have 5 or more servings of fruits and vegetables.  Education: All About Nutrition: -Group instruction provided by verbal, written material, interactive activities, discussions, models, and posters to present general guidelines for heart healthy nutrition including fat, fiber, MyPlate, the role of sodium in heart healthy nutrition, utilization of the nutrition label, and utilization of this knowledge for meal  planning. Follow up email sent as well. Written material given at graduation. [facility]  Education need identified 02/12/22  Date 02/22/22  Educator MCDelphosInstruction Review Code 1- Verbalizes Understanding       Biometrics:  Pre Biometrics - 02/12/22 1520       Pre Biometrics   Height 5' 5"  (1.651 m)    Weight 143 lb 11.2 oz (65.2 kg)    BMI (Calculated) 23.91    Single Leg Stand 2.1 seconds              Nutrition Therapy Plan and Nutrition Goals:  Nutrition Therapy & Goals - 02/22/22 1215       Nutrition Therapy   Diet Heart healthy, low Na    Drug/Food Interactions Statins/Certain Fruits    Protein (specify units) 60-65g    Fiber 21 grams    Whole Grain Foods 3 servings    Saturated Fats 12 max. grams    Fruits and Vegetables 8 servings/day    Sodium 2 grams      Personal Nutrition Goals   Nutrition Goal ST: add protein/fat to snacks (peanut butter, cottage cheese, cheese cubes), try protein ball recipe LT: Maintain weight, meet calorie/protein needs    Comments [age].o. F admitted to rehab for chronic systolic heart failure. PMHx includes HTN, HLD, osteoporosis, NICM, anxiety, cardiomegaly, chronic fatigue. Relevant medications includes Ca-vit D, lasix, K+, MVI, evesta, zocor. PYP Score: 61. Vegetables & Fruits 8/12. Breads, Grains & Cereals 5/12. Red & Processed Meat 8/12. Poultry 2/2. Fish & Shellfish 1/4. Beans, Nuts & Seeds 2/4. Milk & Dairy Foods 3/6. Toppings, Oils, Seasonings & Salt 12/20. Sweets, Snacks & Restaurant Food 11/14. Beverages 9/10.  MaWyonaries to eat smaller, more frequent meals as her appetite has been lower. She reports her doctor told her not to lose anymore weight. She enjoys fruit and vegetables and keeps starchy vegetables and grains to a minimum on her plate. She enjoys  chicken as her primary source of protein and is open to including other sources of protein for snacks  such as peanut butter, cheese, and cottage cheese to add with her fruit to bump up the nutrition. She uses olive oil and some butter for cooking and she does not salt her food. For breakfast she enjoys having a bar or cream of wehat or oatmeal - encouraged her to have oatmeal more often and bulking up it with fruit like berries, and nuts/seeds. She enjoys sourdough bread, but does not eat it frequently. She used to enjoy sweets more, but has not had as much of a taste for them - discussed another snack option such as energy balls that include nut/seed butter, oats, some liquid sweetener or water to add mositure, and add ins like dried friet, chocolate chips, or cocoa powder - she was interested in this. She limits her fluid, but water is her drink of choice. Discussed general heart healthy eating.      Intervention Plan   Intervention Prescribe, educate and counsel regarding individualized specific dietary modifications aiming towards targeted core components such as weight, hypertension, lipid management, diabetes, heart failure and other comorbidities.    Expected Outcomes Short Term Goal: Understand basic principles of dietary content, such as calories, fat, sodium, cholesterol and nutrients.;Short Term Goal: A plan has been developed with personal nutrition goals set during dietitian appointment.;Long Term Goal: Adherence to prescribed nutrition plan.             Nutrition Assessments:  MEDIFICTS Score Key: ?70 Need to make dietary changes  40-70 Heart Healthy Diet ? 40 Therapeutic Level Cholesterol Diet  Flowsheet Row Pulmonary Rehab from 02/12/2022 in [facility] Pulmonary Rehab  Picture Your Plate Total Score on Admission 61      Picture Your Plate Scores: <08 Unhealthy dietary pattern with much room for improvement. 41-50 Dietary pattern unlikely to meet recommendations for good health and room for improvement. 51-60 More healthful dietary pattern, with some room for  improvement.  >60 Healthy dietary pattern, although there may be some specific behaviors that could be improved.   Nutrition Goals Re-Evaluation:  Nutrition Goals Re-Evaluation     [place] Name 04/05/22 0957             Goals   Nutrition Goal ST: continue to add protein/fat to snacks (peanut butter, cottage cheese, cheese cubes), try protein ball recipe LT: Maintain weight, meet calorie/protein needs       Comment Makaylin reports continuing to make herself eat - due to her chronic nausea. She continues to include fruits, vegetables, lean proteins, and heart healthy fats. She has been having cottage cheese and peanut butter during the day as well.  She has not tried the protein ball recipe yet, but would like to.       Expected Outcome ST: continue to add protein/fat to snacks (peanut butter, cottage cheese, cheese cubes), try protein ball recipe LT: Maintain weight, meet calorie/protein needs                Nutrition Goals Discharge (Final Nutrition Goals Re-Evaluation):  Nutrition Goals Re-Evaluation - 04/05/22 0957       Goals   Nutrition Goal ST: continue to add protein/fat to snacks (peanut butter, cottage cheese, cheese cubes), try protein ball recipe LT: Maintain weight, meet calorie/protein needs    Comment Carrah reports continuing to make herself eat - due to her chronic nausea. She continues to include fruits, vegetables, lean proteins, and heart  healthy fats. She has been having cottage cheese and peanut butter during the day as well.  She has not tried the protein ball recipe yet, but would like to.    Expected Outcome ST: continue to add protein/fat to snacks (peanut butter, cottage cheese, cheese cubes), try protein ball recipe LT: Maintain weight, meet calorie/protein needs             Psychosocial: Target Goals: Acknowledge presence or absence of significant depression and/or stress, maximize coping skills, provide positive support system. Participant is able to  verbalize types and ability to use techniques and skills needed for reducing stress and depression.   Education: Stress, Anxiety, and Depression - Group verbal and visual presentation to define topics covered.  Reviews how body is impacted by stress, anxiety, and depression.  Also discusses healthy ways to reduce stress and to treat/manage anxiety and depression.  Written material given at graduation. Flowsheet Row Pulmonary Rehab from 04/05/2022 in [facility] Cardiac and Pulmonary Rehab  Date 03/22/22  Educator [facility]  Instruction Review Code 1- [employer] Understanding       Education: Sleep Hygiene -Provides group verbal and written instruction about how sleep can affect your health.  Define sleep hygiene, discuss sleep cycles and impact of sleep habits. Review good sleep hygiene tips.    Initial Review & Psychosocial Screening:  Initial Psych Review & Screening - 02/01/22 0913       Initial Review   Current issues with None Identified      Family Dynamics   Good Support System? Yes    Comments Clotee is in good spirtirs and has four kids that she can turn to for support. She has her husband that she looks after at times and has great support from him.      Barriers   Psychosocial barriers to participate in program The patient should benefit from training in stress management and relaxation.;There are no identifiable barriers or psychosocial needs.      Screening Interventions   Interventions Encouraged to exercise;Provide feedback about the scores to participant;To provide support and resources with identified psychosocial needs    Expected Outcomes Long Term Goal: Stressors or current issues are controlled or eliminated.;Short Term goal: Utilizing psychosocial counselor, staff and physician to assist with identification of specific Stressors or current issues interfering with healing process. Setting desired goal for each stressor or current issue identified.;Short Term goal:  Identification and review with participant of any Quality of Life or Depression concerns found by scoring the questionnaire.;Long Term goal: The participant improves quality of Life and PHQ9 Scores as seen by post scores and/or verbalization of changes             Quality of Life Scores:  Quality of Life - 02/12/22 1523       Quality of Life   Select Quality of Life      Quality of Life Scores   Health/Function Pre 22.23 %    Socioeconomic Pre 24 %    Psych/Spiritual Pre 23.36 %    Family Pre 25.2 %    GLOBAL Pre 23.31 %            Scores of 19 and below usually indicate a poorer quality of life in these areas.  A difference of  2-3 points is a clinically meaningful difference.  A difference of 2-3 points in the total score of the Quality of Life Index has been associated with significant improvement in overall quality of life, self-image, physical symptoms, and  general health in studies assessing change in quality of life.  PHQ-9: Review Flowsheet       04/05/2022 03/15/2022 02/12/2022  Depression screen PHQ 2/9  Decreased Interest 1 1 1   Down, Depressed, Hopeless 0 1 1  PHQ - 2 Score 1 2 2   Altered sleeping 0 1 1  Tired, decreased energy 0 1 1  Change in appetite 0 1 3  Feeling bad or failure about yourself  1 0 1  Trouble concentrating 0 0 0  Moving slowly or fidgety/restless 0 0 1  Suicidal thoughts 0 0 0  PHQ-9 Score 2 5 9   Difficult doing work/chores Somewhat difficult Somewhat difficult Somewhat difficult   Interpretation of Total Score  Total Score Depression Severity:  1-4 = Minimal depression, 5-9 = Mild depression, 10-14 = Moderate depression, 15-19 = Moderately severe depression, 20-27 = Severe depression   Psychosocial Evaluation and Intervention:  Psychosocial Evaluation - 02/01/22 0915       Psychosocial Evaluation & Interventions   Interventions Relaxation education;Stress management education;Encouraged to exercise with the program and follow  exercise prescription    Comments Alexzandrea is in good spirtirs and has four kids that she can turn to for support. She has her husband that she looks after at times and has great support from him.    Expected Outcomes Short: Start [facility] to help with mood. Long: Maintain a healthy mental state.    Continue Psychosocial Services  Follow up required by staff             Psychosocial Re-Evaluation:  Psychosocial Re-Evaluation     [place] Name 02/27/22 1023 04/05/22 1000           Psychosocial Re-Evaluation   Current issues with None Identified Current Stress Concerns      Comments Patient reports that she does have some trouble getting back to sleep around 3 am, but for the most part sleeps well and has no new stress concerns. She went up to her house by [facility] last weekend, she has missed going - her husband does not like to go and he needs her to help to remember to take his medications. She reports always having stress looking after her husband. She relies on "the good lord" for support as well her husbands Network engineer and daughters. She is also getting help right now from a maid. She reports feeling tired as she is always busy and she is more tired the day after coming to rehab. She has a poor appetite sometimes due to chronic nausea. She reports her friends have either passed or are in nursing homes - she will visit occassionally. She sometimes will take a nap, watch Murder [place] Wrote, sit and watch the birds, and play with her dog to help to reduce stress.      Expected Outcomes Short: continue to attend pulmonary rehab for mental health benefits. Long: maintain good mental health habits independently ST: continue to attend pulmonary rehab for mental health benefits. LT: maintain good mental health habits independently      Interventions Encouraged to attend Pulmonary Rehabilitation for the exercise Encouraged to attend Pulmonary Rehabilitation for the exercise      Continue  Psychosocial Services  Follow up required by staff Follow up required by staff        Initial Review   Source of Stress Concerns -- Family               Psychosocial Discharge (Final Psychosocial Re-Evaluation):  Psychosocial Re-Evaluation -  04/05/22 1000       Psychosocial Re-Evaluation   Current issues with Current Stress Concerns    Comments She went up to her house by [facility] last weekend, she has missed going - her husband does not like to go and he needs her to help to remember to take his medications. She reports always having stress looking after her husband. She relies on "the good lord" for support as well her husbands Network engineer and daughters. She is also getting help right now from a maid. She reports feeling tired as she is always busy and she is more tired the day after coming to rehab. She has a poor appetite sometimes due to chronic nausea. She reports her friends have either passed or are in nursing homes - she will visit occassionally. She sometimes will take a nap, watch Murder [place] Wrote, sit and watch the birds, and play with her dog to help to reduce stress.    Expected Outcomes ST: continue to attend pulmonary rehab for mental health benefits. LT: maintain good mental health habits independently    Interventions Encouraged to attend Pulmonary Rehabilitation for the exercise    Continue Psychosocial Services  Follow up required by staff      Initial Review   Source of Stress Concerns Family             Education: Education Goals: Education classes will be provided on a weekly basis, covering required topics. Participant will state understanding/return demonstration of topics presented.  Learning Barriers/Preferences:  Learning Barriers/Preferences - 02/01/22 0909       Learning Barriers/Preferences   Learning Barriers None    Learning Preferences None             General Pulmonary Education Topics:  Infection Prevention: - Provides verbal  and written material to individual with discussion of infection control including proper hand washing and proper equipment cleaning during exercise session. Flowsheet Row Pulmonary Rehab from 04/05/2022 in [facility] Cardiac and Pulmonary Rehab  Date 02/12/22  Educator [facility]  Instruction Review Code 1- Verbalizes Understanding       Falls Prevention: - Provides verbal and written material to individual with discussion of falls prevention and safety. Flowsheet Row Pulmonary Rehab from 04/05/2022 in [facility] Cardiac and Pulmonary Rehab  Date 02/12/22  Educator [facility]  Instruction Review Code 1- Verbalizes Understanding       Chronic Lung Disease Review: - Group verbal instruction with posters, models, PowerPoint presentations and videos,  to review new updates, new respiratory medications, new advancements in procedures and treatments. Providing information on websites and "800" numbers for continued self-education. Includes information about supplement oxygen, available portable oxygen systems, continuous and intermittent flow rates, oxygen safety, concentrators, and Medicare reimbursement for oxygen. Explanation of Pulmonary Drugs, including class, frequency, complications, importance of spacers, rinsing mouth after steroid MDI's, and proper cleaning methods for nebulizers. Review of basic lung anatomy and physiology related to function, structure, and complications of lung disease. Review of risk factors. Discussion about methods for diagnosing sleep apnea and types of masks and machines for OSA. Includes a review of the use of types of environmental controls: home humidity, furnaces, filters, dust mite/pet prevention, HEPA vacuums. Discussion about weather changes, air quality and the benefits of nasal washing. Instruction on Warning signs, infection symptoms, calling MD promptly, preventive modes, and value of vaccinations. Review of effective airway clearance, coughing and/or vibration techniques. Emphasizing  that all should Create an Action Plan. Written material given at graduation. Flowsheet Row Pulmonary  Rehab from 04/05/2022 in [facility] Cardiac and Pulmonary Rehab  Date 03/15/22  [facility]ator [facility]  Instruction Review Code 1- Verbalizes Understanding       AED/CPR: - Group verbal and written instruction with the use of models to demonstrate the basic use of the AED with the basic ABC's of resuscitation.    Anatomy and Cardiac Procedures: - Group verbal and visual presentation and models provide information about basic cardiac anatomy and function. Reviews the testing methods done to diagnose heart disease and the outcomes of the test results. Describes the treatment choices: Medical Management, Angioplasty, or Coronary Bypass Surgery for treating various heart conditions including Myocardial Infarction, Angina, Valve Disease, and Cardiac Arrhythmias.  Written material given at graduation. Flowsheet Row Pulmonary Rehab from 04/05/2022 in [facility] Cardiac and Pulmonary Rehab  Education need identified 02/12/22       Medication Safety: - Group verbal and visual instruction to review commonly prescribed medications for heart and lung disease. Reviews the medication, class of the drug, and side effects. Includes the steps to properly store meds and maintain the prescription regimen.  Written material given at graduation.   Other: -Provides group and verbal instruction on various topics (see comments)   Knowledge Questionnaire Score:  Knowledge Questionnaire Score - 02/12/22 1526       Knowledge Questionnaire Score   Pre Score 22/26              Core Components/Risk Factors/Patient Goals at Admission:  Personal Goals and Risk Factors at Admission - 02/12/22 1523       Core Components/Risk Factors/Patient Goals on Admission    Weight Management Yes;Weight Maintenance    Intervention Weight Management: Develop a combined nutrition and exercise program designed to reach desired caloric intake,  while maintaining appropriate intake of nutrient and fiber, sodium and fats, and appropriate energy expenditure required for the weight goal.;Weight Management: Provide education and appropriate resources to help participant work on and attain dietary goals.;Weight Management/Obesity: Establish reasonable short term and long term weight goals.    Admit Weight 143 lb 11.2 oz (65.2 kg)    Goal Weight: Short Term 143 lb 11.2 oz (65.2 kg)    Goal Weight: Long Term 143 lb 11.2 oz (65.2 kg)    Expected Outcomes Short Term: Continue to assess and modify interventions until short term weight is achieved;Long Term: Adherence to nutrition and physical activity/exercise program aimed toward attainment of established weight goal;Weight Maintenance: Understanding of the daily nutrition guidelines, which includes 25-35% calories from fat, 7% or less cal from saturated fats, less than 21m cholesterol, less than 1.5gm of sodium, & 5 or more servings of fruits and vegetables daily;Understanding recommendations for meals to include 15-35% energy as protein, 25-35% energy from fat, 35-60% energy from carbohydrates, less than 2061mof dietary cholesterol, 20-35 gm of total fiber daily;Understanding of distribution of calorie intake throughout the day with the consumption of 4-5 meals/snacks    Improve shortness of breath with ADL's Yes    Intervention Provide education, individualized exercise plan and daily activity instruction to help decrease symptoms of SOB with activities of daily living.    Expected Outcomes Short Term: Improve cardiorespiratory fitness to achieve a reduction of symptoms when performing ADLs;Long Term: Be able to perform more ADLs without symptoms or delay the onset of symptoms    Heart Failure Yes    Intervention Provide a combined exercise and nutrition program that is supplemented with education, support and counseling about heart failure. Directed toward relieving symptoms such  as shortness of  breath, decreased exercise tolerance, and extremity edema.    Expected Outcomes Improve functional capacity of life;Short term: Attendance in program 2-3 days a week with increased exercise capacity. Reported lower sodium intake. Reported increased fruit and vegetable intake. Reports medication compliance.;Short term: Daily weights obtained and reported for increase. Utilizing diuretic protocols set by physician.;Long term: Adoption of self-care skills and reduction of barriers for early signs and symptoms recognition and intervention leading to self-care maintenance.    Hypertension Yes    Intervention Provide education on lifestyle modifcations including regular physical activity/exercise, weight management, moderate sodium restriction and increased consumption of fresh fruit, vegetables, and low fat dairy, alcohol moderation, and smoking cessation.;Monitor prescription use compliance.    Expected Outcomes Short Term: Continued assessment and intervention until BP is < 140/72m HG in hypertensive participants. < 130/84mHG in hypertensive participants with diabetes, heart failure or chronic kidney disease.;Long Term: Maintenance of blood pressure at goal levels.    Lipids Yes    Intervention Provide education and support for participant on nutrition & aerobic/resistive exercise along with prescribed medications to achieve LDL <7047mHDL >65m57m  Expected Outcomes Short Term: Participant states understanding of desired cholesterol values and is compliant with medications prescribed. Participant is following exercise prescription and nutrition guidelines.;Long Term: Cholesterol controlled with medications as prescribed, with individualized exercise RX and with personalized nutrition plan. Value goals: LDL < 70mg32mL > 40 mg.             Education:Diabetes - Individual verbal and written instruction to review signs/symptoms of diabetes, desired ranges of glucose level fasting, after meals and with  exercise. Acknowledge that pre and post exercise glucose checks will be done for 3 sessions at entry of program.   Know Your Numbers and Heart Failure: - Group verbal and visual instruction to discuss disease risk factors for cardiac and pulmonary disease and treatment options.  Reviews associated critical values for Overweight/Obesity, Hypertension, Cholesterol, and Diabetes.  Discusses basics of heart failure: signs/symptoms and treatments.  Introduces Heart Failure Zone chart for action plan for heart failure.  Written material given at graduation. Flowsheet Row [facility]monary [facility]  Education need identified 02/12/22       Core Components/Risk Factors/Patient Goals Review:   Goals and Risk Factor Review     Row Name 02/27/22 1021 04/05/22 1014           Core Components/Risk Factors/Patient Goals Review   Personal Goals Review Weight Management/Obesity;Heart Failure;Develop more efficient breathing techniques such as purse lipped breathing and diaphragmatic breathing and practicing self-pacing with activity.;Hypertension;Lipids;Improve shortness of breath with ADL's Weight Management/Obesity;Heart Failure;Develop more efficient breathing techniques such as purse lipped breathing and diaphragmatic breathing and practicing self-pacing with activity.;Hypertension;Lipids;Improve shortness of breath with ADL's      Review Patient reports that she monitors BP at home and it varies depending on the day. She does report taking all meds as prescribed. He weight has been consistent. MargaMiskrts losing about 8 pounds before coming here to her medications, but has now been weight stable. She reports thats she has been short of breath, but will pause to catch her breath. She reports continuing to use PLB to help control shortness of breath during exercise and reports using it frequently. She has a pulse oximeter at home as well. She checks her BP daily -  124/60 this morning at home, it will get to be 140s/621H/086Vsystolic BP, but  her HR is on the lower end. She reports not seeing her cardiologist until the end of August and has an echocardiogram at the beginning of August.      Expected Outcomes Short: contiue to monitor BP and weight at home. Long: continue healthy behaviors to control cardiac risk factors. Short: contiue to monitor BP and weight at home. Long: continue healthy behaviors to control cardiac risk factors.               Core Components/Risk Factors/Patient Goals at Discharge (Final Review):   Goals and Risk Factor Review - 04/05/22 1014       Core Components/Risk Factors/Patient Goals Review   Personal Goals Review Weight Management/Obesity;Heart Failure;Develop more efficient breathing techniques such as purse lipped breathing and diaphragmatic breathing and practicing self-pacing with activity.;Hypertension;Lipids;Improve shortness of breath with ADL's    Review Antara reports losing about 8 pounds before coming here to her medications, but has now been weight stable. She reports thats she has been short of breath, but will pause to catch her breath. She reports continuing to use PLB to help control shortness of breath during exercise and reports using it frequently. She has a pulse oximeter at home as well. She checks her BP daily - 124/60 this morning at home, it will get to be 010U/725D for systolic BP, but her HR is on the lower end. She reports not seeing her cardiologist until the end of August and has an echocardiogram at the beginning of August.    Expected Outcomes Short: contiue to monitor BP and weight at home. Long: continue healthy behaviors to control cardiac risk factors.             ITP Comments:  ITP Comments     Row Name 02/01/22 0907 02/12/22 1507 02/13/22 0952 02/14/22 0842 02/22/22 1149   ITP Comments Virtual Visit completed. Patient informed on EP and RD appointment and 6 Minute walk test. Patient  also informed of patient health questionnaires on My Chart. Patient Verbalizes understanding. [facility]. Completed 6MWT and gym orientation. Initial ITP created and sent for review to Dr. Zetta Bills, Medical Director. First full day of exercise!  Patient was oriented to gym and equipment including functions, settings, policies, and procedures.  Patient's individual exercise prescription and treatment plan were reviewed.  All starting workloads were established based on the results of the 6 minute walk test done at initial orientation visit.  The plan for exercise progression was also introduced and progression will be customized based on patient's performance and goals. 30 Day review completed. Medical Director ITP review done, changes made as directed, and signed approval by Medical Director.  NEW Completed initial RD consultation    Center Name 03/14/22 1253 04/11/22 0936         ITP Comments 30 Day review completed. Medical Director ITP review done, changes made as directed, and signed approval by Medical Director. 30 Day review completed. Medical Director ITP review done, changes made as directed, and signed approval by Medical Director.               Comments:

## 2022-04-12 DIAGNOSIS — I5022 Chronic systolic (congestive) heart failure: Secondary | ICD-10-CM | POA: Diagnosis not present

## 2022-04-12 NOTE — Progress Notes (Signed)
Daily Session Note  Patient Details  Name: Robin Ortiz MRN: [id number] Date of Birth: 1935/08/11 Referring Provider:   Flowsheet Row Pulmonary Rehab from 02/12/2022 in [facility] Cardiac and Pulmonary Rehab  Referring Provider Gollan       Encounter Date: 04/12/2022  Check In:  Session Check In - 04/12/22 0951       Check-In   Supervising physician immediately available to respond to emergencies See telemetry face sheet for immediately available ER MD    Location [facility]-Cardiac & Pulmonary Rehab    Staff Present Birdie Sons, MPA, RN;Melissa [place], RDN, LDN;Jessica [place], MA, RCEP, CCRP, CCET;Joseph Nesco, [place]    Virtual Visit No    Medication changes reported     No    Fall or balance concerns reported    No    Tobacco Cessation No Change    Warm-up and Cool-down Performed on first and last piece of equipment    Resistance Training Performed Yes    VAD Patient? No    PAD/SET Patient? No      Pain Assessment   Currently in Pain? No/denies                Social History   Tobacco Use  Smoking Status Never  Smokeless Tobacco Never    Goals Met:  Independence with exercise equipment Exercise tolerated well No report of concerns or symptoms today Strength training completed today  Goals Unmet:  Not Applicable  Comments: Pt able to follow exercise prescription today without complaint.  Will continue to monitor for progression.    Dr. Emily Filbert is Medical Director for [facility].  Dr. Ottie Glazier is Medical Director for [facility] Pulmonary Rehabilitation.

## 2022-04-17 DIAGNOSIS — I5022 Chronic systolic (congestive) heart failure: Secondary | ICD-10-CM | POA: Diagnosis not present

## 2022-04-17 NOTE — Progress Notes (Signed)
Daily Session Note  Patient Details  Name: Robin Ortiz MRN: [id number] Date of Birth: 02-28-1935 Referring Provider:   Flowsheet Row Pulmonary Rehab from 02/12/2022 in [facility] Cardiac and Pulmonary Rehab  Referring Provider Gollan       Encounter Date: 04/17/2022  Check In:  Session Check In - 04/17/22 [place]       Check-In   Supervising physician immediately available to respond to emergencies See telemetry face sheet for immediately available ER MD    Location [facility]-Cardiac & Pulmonary Rehab    Staff Present Earlean Shawl, BS, ACSM CEP, Exercise Physiologist;Mirabella Hilario, RN,BC,MSN;Jessica [place], MA, RCEP, CCRP, CCET    Virtual Visit No    Medication changes reported     No    Fall or balance concerns reported    No    Tobacco Cessation No Change    Warm-up and Cool-down Performed on first and last piece of equipment    Resistance Training Performed Yes    VAD Patient? No    PAD/SET Patient? No      Pain Assessment   Currently in Pain? No/denies    Multiple Pain Sites No                Social History   Tobacco Use  Smoking Status Never  Smokeless Tobacco Never    Goals Met:  Independence with exercise equipment Exercise tolerated well No report of concerns or symptoms today  Goals Unmet:  Not Applicable  Comments: Pt able to follow exercise prescription today without complaint.  Will continue to monitor for progression.    Dr. Emily Filbert is Medical Director for [facility].  Dr. Ottie Glazier is Medical Director for [facility] Pulmonary Rehabilitation.

## 2022-04-18 ENCOUNTER — Ambulatory Visit (INDEPENDENT_AMBULATORY_CARE_PROVIDER_SITE_OTHER): Payer: Medicare Other

## 2022-04-18 DIAGNOSIS — I428 Other cardiomyopathies: Secondary | ICD-10-CM | POA: Diagnosis not present

## 2022-04-18 LAB — ECHOCARDIOGRAM COMPLETE
AR max vel: 2 cm2
AV Area VTI: 2.03 cm2
AV Area mean vel: 1.94 cm2
AV Mean grad: 3 mmHg
AV Peak grad: 5.9 mmHg
Ao pk vel: 1.21 m/s
Area-P 1/2: 3.28 cm2
Calc EF: 46 %
S' Lateral: 4 cm
Single Plane A2C EF: 48.4 %
Single Plane A4C EF: 46.8 %

## 2022-04-19 ENCOUNTER — Encounter: Payer: Medicare Other | Admitting: *Deleted

## 2022-04-19 ENCOUNTER — Encounter: Payer: Self-pay | Admitting: Cardiovascular Disease

## 2022-04-19 DIAGNOSIS — I5022 Chronic systolic (congestive) heart failure: Secondary | ICD-10-CM | POA: Diagnosis not present

## 2022-04-19 NOTE — Progress Notes (Signed)
Daily Session Note  Patient Details  Name: Robin Ortiz MRN: [id number] Date of Birth: 08-25-1935 Referring Provider:   Flowsheet Row Pulmonary Rehab from 02/12/2022 in [facility] Cardiac and Pulmonary Rehab  Referring Provider Gollan       Encounter Date: 04/19/2022  Check In:  Session Check In - 04/19/22 1042       Check-In   Supervising physician immediately available to respond to emergencies See telemetry face sheet for immediately available ER MD    Location [facility]-Cardiac & Pulmonary Rehab    Staff Present Heath Lark, RN, BSN, CCRP;Melissa Mona, RDN, LDN;Kristen Coble, RN,BC,MSN    Virtual Visit No    Medication changes reported     No    Fall or balance concerns reported    No    Warm-up and Cool-down Performed on first and last piece of equipment    Resistance Training Performed Yes    VAD Patient? No    PAD/SET Patient? No      Pain Assessment   Currently in Pain? No/denies                Social History   Tobacco Use  Smoking Status Never  Smokeless Tobacco Never    Goals Met:  Independence with exercise equipment Exercise tolerated well No report of concerns or symptoms today  Goals Unmet:  Not Applicable  Comments: Pt able to follow exercise prescription today without complaint.  Will continue to monitor for progression.    Dr. Emily Filbert is Medical Director for [facility].  Dr. Ottie Glazier is Medical Director for [facility] Pulmonary Rehabilitation.

## 2022-04-24 ENCOUNTER — Telehealth: Payer: Self-pay | Admitting: Emergency Medicine

## 2022-04-24 DIAGNOSIS — I5022 Chronic systolic (congestive) heart failure: Secondary | ICD-10-CM | POA: Diagnosis not present

## 2022-04-24 MED ORDER — CARVEDILOL 3.125 MG PO TABS: 3.1250 mg | ORAL_TABLET | Freq: Two times a day (BID) | ORAL | 3 refills | Status: DC

## 2022-04-24 NOTE — Telephone Encounter (Signed)
-----   Message from Antonieta Iba, MD sent at 04/18/2022  3:10 PM EDT ----- Echocardiogram Continued improvement in ejection fraction now up to 45 to 50% Up from 35 to 40% in January 2023 Heart rate running low would decrease carvedilol down to 3.125 twice daily Monitor blood pressure and heart rate at home, give it 1 week to settle out then could call with numbers

## 2022-04-24 NOTE — Progress Notes (Signed)
Daily Session Note  Patient Details  Name: Robin Ortiz MRN: [id number] Date of Birth: 1935-07-16 Referring Provider:   Flowsheet Row Pulmonary Rehab from 02/12/2022 in [facility] Cardiac and Pulmonary Rehab  Referring Provider Gollan       Encounter Date: 04/24/2022  Check In:  Session Check In - 04/24/22 [place]       Check-In   Supervising physician immediately available to respond to emergencies See telemetry face sheet for immediately available ER MD    Location [facility]-Cardiac & Pulmonary Rehab    Staff Present Earlean Shawl, BS, ACSM CEP, Exercise Physiologist;Yerik Zeringue, RN,BC,MSN;Jessica [place], MA, RCEP, CCRP, CCET    Virtual Visit No    Medication changes reported     No    Fall or balance concerns reported    No    Tobacco Cessation No Change    Warm-up and Cool-down Performed on first and last piece of equipment    Resistance Training Performed Yes    VAD Patient? No    PAD/SET Patient? No      Pain Assessment   Currently in Pain? No/denies    Multiple Pain Sites No                Social History   Tobacco Use  Smoking Status Never  Smokeless Tobacco Never    Goals Met:  Independence with exercise equipment Exercise tolerated well No report of concerns or symptoms today Strength training completed today  Goals Unmet:  Not Applicable  Comments: Pt able to follow exercise prescription today without complaint.  Will continue to monitor for progression.    Dr. Emily Filbert is Medical Director for [facility].  Dr. Ottie Glazier is Medical Director for [facility] Pulmonary Rehabilitation.

## 2022-04-24 NOTE — Telephone Encounter (Signed)
Called patient. No answer. Detailed message left per DPR. Updated RX sent into pharmacy.

## 2022-04-26 ENCOUNTER — Encounter: Payer: Medicare Other | Admitting: *Deleted

## 2022-04-26 DIAGNOSIS — I5022 Chronic systolic (congestive) heart failure: Secondary | ICD-10-CM

## 2022-04-26 NOTE — Progress Notes (Signed)
Daily Session Note  Patient Details  Name: Robin Ortiz MRN: [id number] Date of Birth: 07-09-1935 Referring Provider:   Flowsheet Row Pulmonary Rehab from 02/12/2022 in [facility] Cardiac and Pulmonary Rehab  Referring Provider Gollan       Encounter Date: 04/26/2022  Check In:  Session Check In - 04/26/22 1027       Check-In   Supervising physician immediately available to respond to emergencies See telemetry face sheet for immediately available ER MD    Location [facility]-Cardiac & Pulmonary Rehab    Staff Present Heath Lark, RN, BSN, Jacklynn Bue, MS, ASCM CEP, Exercise Physiologist;Jessica [place], MA, RCEP, CCRP, CCET    Virtual Visit No    Medication changes reported     No    Fall or balance concerns reported    No    Warm-up and Cool-down Performed on first and last piece of equipment    Resistance Training Performed Yes    VAD Patient? No    PAD/SET Patient? No      Pain Assessment   Currently in Pain? No/denies                Social History   Tobacco Use  Smoking Status Never  Smokeless Tobacco Never    Goals Met:  Independence with exercise equipment Exercise tolerated well No report of concerns or symptoms today  Goals Unmet:  Not Applicable  Comments: Pt able to follow exercise prescription today without complaint.  Will continue to monitor for progression.    Dr. Emily Filbert is Medical Director for [facility].  Dr. Ottie Glazier is Medical Director for [facility] Pulmonary Rehabilitation.

## 2022-04-30 ENCOUNTER — Ambulatory Visit (INDEPENDENT_AMBULATORY_CARE_PROVIDER_SITE_OTHER): Payer: Medicare Other | Admitting: Cardiovascular Disease

## 2022-04-30 ENCOUNTER — Encounter: Payer: Self-pay | Admitting: Cardiovascular Disease

## 2022-04-30 VITALS — BP 150/70 | HR 39 | Ht 65.0 in | Wt 140.1 lb

## 2022-04-30 DIAGNOSIS — R001 Bradycardia, unspecified: Secondary | ICD-10-CM | POA: Diagnosis not present

## 2022-04-30 DIAGNOSIS — I4819 Other persistent atrial fibrillation: Secondary | ICD-10-CM | POA: Diagnosis not present

## 2022-04-30 DIAGNOSIS — I5022 Chronic systolic (congestive) heart failure: Secondary | ICD-10-CM

## 2022-04-30 NOTE — Patient Instructions (Addendum)
Repeat EKG in 2 weeks in office  Medication Instructions:  Please stop the amiodarone and coreg  If you need a refill on your cardiac medications before your next appointment, please call your pharmacy.   Lab work: No new labs needed  Testing/Procedures: No new testing needed  Follow-Up: At [facility], you and your health needs are our priority.  As part of our continuing mission to provide you with exceptional heart care, we have created designated Provider Care Teams.  These Care Teams include your primary Cardiologist (physician) and Advanced Practice Providers (APPs -  Physician Assistants and Nurse Practitioners) who all work together to provide you with the care you need, when you need it.  You will need a follow up appointment in 1 month  Providers on your designated Care Team:   Nicolasa Ducking, NP Eula Listen, PA-C Cadence Fransico Michael, [place]  COVID-19 Vaccine Information can be found at: [email or web address] For questions related to vaccine distribution or appointments, please email [email or web address] or call [phone].

## 2022-04-30 NOTE — Progress Notes (Signed)
Cardiology Office Note  Date:  04/30/2022   ID:  Robin Ortiz, DOB August 28, 1935, MRN [id number]  PCP:  Robin Billet, MD   Chief Complaint  Patient presents with   3 month follow up     Patient c/o weakness & nauseated all day. Medications reviewed by the patient verbally.      HPI:  [name] is a pleasant 87 year-old woman with a history of  hypertension,  hyperlipidemia,  remote history of nonischemic cardiomyopathy with initial ejection fraction 20-30% in 2002 that has improved to 50-55% on echocardiogram in March 2009,  status post bilateral knee replacement  Anxiety/gets stressed, chronic fatigue  CHA2DS2-VASc of 5. Ejection fraction 35 to 40% in atrial fibrillation Jan 2023 Cardioversion December 14, 2021 for fib flutter, on amiodarone who presents for routine follow up of her cardiomyopathy, atrial fib/flutter  Last seen in clinic by myself May 2023 Received phone calls concerning low energy, chronic nausea Participating at cardiac/pulmonary rehab Heart rate low today, rate 39 bpm, (already took medication this Am) No orthostasis symptoms Heart rate was higher with cardiac rehab, 50 to 60s Chronic nausea seem to start when she began amiodarone she feels  Stress at home, husband with dementia Having good days with energy and bad days, sometimes difficult to participating in the cardiac rehab, energy gives out Doing 2 days a week  Lasix 20 three times a week, no edema, no ABD swelling  Echocardiogram April 18, 2022 reviewed Ejection fraction up to 45 to 50%, previously 35 to 40% At that time for bradycardia we decreased cardiac to 3.125 twice daily  EKG personally reviewed by myself on todays visit Junctional bradycardia rate 39 bpm  Other past medical history reviewed Seen by one of our providers January 11, 2022 Bradycardia noted,carvedilol dosing decreased down to 6.25 twice daily, amlodipine held  x-ray January 11, 2022, resolution of pleural effusions  Prior  history of GERD, arthritic pain left leg, neuropathy  Seen by one of our providers October 17, 2021, in atrial fibrillation  Prior echocardiogram history EF previous as low as 20 to 30% in 2002  45 to 50% in December 2016.  Echo 10/18/2021 ef 35 to 40% Moderate mitral valve  regurgitation.  Left atrial size was severely dilated.  Tricuspid valve regurgitation is mild to moderate.   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  PSH:    Past Surgical History:  Procedure Laterality Date   CARDIOVERSION N/A 12/14/2021   Procedure: CARDIOVERSION;  Surgeon: Robin Merritts, MD;  Location: [facility] ORS;  Service: Cardiovascular;  Laterality: N/A;   ROTATOR CUFF REPAIR     left shoulder   ruptured tendon right foot Right    surgical repair   TOTAL KNEE ARTHROPLASTY Bilateral     Current Outpatient Medications  Medication Sig Dispense Refill   amiodarone (PACERONE) 200 MG tablet Take 1 tablet (200 mg total) by mouth daily. 90 tablet 3   apixaban (ELIQUIS) 5 MG TABS tablet Take 1 tablet (5 mg total) by mouth 2 (two) times daily. 60 tablet 6   Calcium-Vitamin D 600-200 MG-UNIT per tablet Take 1 tablet by mouth daily.     carvedilol (COREG) 3.125 MG tablet Take 1 tablet (3.125 mg total) by mouth 2 (two) times daily. 30 tablet 3   celecoxib (CELEBREX) 200 MG capsule  Take 200 mg by mouth daily as needed for mild pain.     diclofenac Sodium (VOLTAREN) 1 % GEL Apply 2 g topically daily as needed (Arthritis).     furosemide (LASIX) 40 MG tablet Take 0.5 tablets (20 mg total) by mouth as directed. Take half a tablet once daily and if weight gain of 3 pounds in 24 hours or 5 pounds in one week you may take additional 20 mg. 30 tablet 3   Misc Natural Products  (OSTEO BI-FLEX ADV JOINT SHIELD PO) Take 1 tablet by mouth daily.     Multiple Vitamin (MULTIVITAMIN) tablet Take 1 tablet by mouth daily. Alive     potassium chloride (KLOR-CON) 10 MEQ tablet Take 1 tablet (10 mEq total) by mouth every Monday, Wednesday, and Friday. (Patient taking differently: Take 10 mEq by mouth daily.) 39 tablet 3   raloxifene (EVISTA) 60 MG tablet Take 60 mg by mouth daily.     sacubitril-valsartan (ENTRESTO) 97-103 MG Take 1 tablet by mouth 2 (two) times daily. 60 tablet 3   simvastatin (ZOCOR) 20 MG tablet Take 20 mg by mouth at bedtime.     diphenhydrAMINE-APAP, sleep, (TYLENOL PM EXTRA STRENGTH PO) Take 1 tablet by mouth at bedtime as needed (sleep). (Patient not taking: Reported on 04/30/2022)     No current facility-administered medications for this visit.    Allergies:   Patient has no known allergies.   Social History:  The patient  reports that she has never smoked. She has never used smokeless tobacco. She reports current alcohol use of about 2.0 - 3.0 standard drinks of alcohol per week. She reports that she does not use drugs.   Family History:   family history includes Breast cancer in her cousin; Breast cancer (age of onset: 9) in her maternal aunt; Breast cancer (age of onset: 1) in her maternal grandmother; Heart disease in her father.   Review of Systems: Review of Systems  Constitutional:  Positive for malaise/fatigue.  HENT: Negative.    Respiratory: Negative.    Cardiovascular: Negative.   Gastrointestinal: Negative.   Musculoskeletal: Negative.   Neurological: Negative.   All other systems reviewed and are negative.   PHYSICAL EXAM: VS:  BP (!) 150/70 (BP Location: Left Arm, Patient Position: Sitting, Cuff Size: Normal)   Pulse (!) 39   Ht 5\' 5"  (1.651 m)   Wt 140 lb 2 oz (63.6 kg)   BMI 23.32 kg/m  , BMI Body mass index is 23.32 kg/m.  Constitutional:  oriented to person, place, and time. No distress.  HENT:  Head: Grossly  normal Eyes:  no discharge. No scleral icterus.  Neck: No JVD, no carotid bruits  Cardiovascular: Bradycardia, regular, no murmurs appreciated Pulmonary/Chest: Clear to auscultation bilaterally, no wheezes or rails Abdominal: Soft.  no distension.  no tenderness.  Musculoskeletal: Normal range of motion Neurological:  normal muscle tone. Coordination normal. No atrophy Skin: Skin warm and dry Psychiatric: normal affect, pleasant   Recent Labs: 10/17/2021: Magnesium 2.2; TSH 1.850 12/12/2021: Hemoglobin 11.8; Platelets 242 12/19/2021: ALT 22 01/11/2022: BUN 27; Creatinine, Ser 1.13; Potassium 4.2; Sodium 142    Lipid Panel No results found for: "CHOL", "HDL", "LDLCALC", "TRIG"    Wt Readings from Last 3 Encounters:  04/30/22 140 lb 2 oz (63.6 kg)  02/16/22 142 lb 6 oz (64.6 kg)  02/12/22 143 lb 11.2 oz (65.2 kg)     ASSESSMENT AND PLAN:  Persistent atrial fibrillation Prior successful cardioversion, maintaining normal sinus rhythm Now  with junctional bradycardia despite decreasing carvedilol Chronic nausea, possibly from amiodarone Given heart rate in the high 30s, we will stop amiodarone and carvedilol Continue Eliquis twice daily Repeat EKG in 2 weeks, has follow-up in 1 month in clinic  Chronic diastolic and systolic CHF continue Lasix 20 mg 3 days a week Appears euvolemic  Mixed hyperlipidemia Continue statin  Essential hypertension Continue Entresto, other medication changes as above  Chronic fatigue/anxiety Exacerbated by bradycardia  Anxiety Stable, managed by primary care  Cardiomyopathy Long history of cardiomyopathy dating back to 2002 Appears euvolemic, Continue Entresto 97/103 mg twice daily Hold carvedilol for bradycardia   Total encounter time more than 30  minutes  Greater than 50% was spent in counseling and coordination of care with the patient    No orders of the defined types were placed in this encounter.    Signed, Dossie Arbour,  M.D., Ph.D. 04/30/2022  [facility] [place], [place] [phone]

## 2022-05-01 ENCOUNTER — Encounter: Payer: Medicare Other | Attending: Cardiovascular Disease | Admitting: *Deleted

## 2022-05-01 DIAGNOSIS — I5022 Chronic systolic (congestive) heart failure: Secondary | ICD-10-CM | POA: Insufficient documentation

## 2022-05-01 NOTE — Progress Notes (Signed)
Daily Session Note  Patient Details  Name: Robin Ortiz MRN: [id number] Date of Birth: 10-24-1934 Referring Provider:   Flowsheet Row Pulmonary Rehab from 02/12/2022 in [facility] Cardiac and Pulmonary Rehab  Referring Provider Gollan       Encounter Date: 05/01/2022  Check In:  Session Check In - 05/01/22 1043       Check-In   Supervising physician immediately available to respond to emergencies See telemetry face sheet for immediately available ER MD    Location [facility]-Cardiac & Pulmonary Rehab    Staff Present Heath Lark, RN, BSN, Laveda Norman, BS, ACSM CEP, Exercise Physiologist;Noah Tickle, BS, Exercise Physiologist    Virtual Visit No    Medication changes reported     Yes    Comments amiodarone and carvedilol stopped    Fall or balance concerns reported    No    Warm-up and Cool-down Performed on first and last piece of equipment    Resistance Training Performed Yes    VAD Patient? No    PAD/SET Patient? No      Pain Assessment   Currently in Pain? No/denies                Social History   Tobacco Use  Smoking Status Never  Smokeless Tobacco Never    Goals Met:  Proper associated with RPD/PD & O2 Sat Independence with exercise equipment Exercise tolerated well No report of concerns or symptoms today  Goals Unmet:  Not Applicable  Comments: Pt able to follow exercise prescription today without complaint.  Will continue to monitor for progression.    Dr. Emily Filbert is Medical Director for [facility].  Dr. Ottie Glazier is Medical Director for [facility] Pulmonary Rehabilitation.

## 2022-05-03 DIAGNOSIS — I5022 Chronic systolic (congestive) heart failure: Secondary | ICD-10-CM

## 2022-05-03 NOTE — Progress Notes (Signed)
Daily Session Note  Patient Details  Name: Robin Ortiz MRN: [id number] Date of Birth: 1934/12/22 Referring Provider:   Flowsheet Row Pulmonary Rehab from 02/12/2022 in [facility] Cardiac and Pulmonary Rehab  Referring Provider Gollan       Encounter Date: 05/03/2022  Check In:  Session Check In - 05/03/22 1004       Check-In   Supervising physician immediately available to respond to emergencies See telemetry face sheet for immediately available ER MD    Location [facility]-Cardiac & Pulmonary Rehab    Staff Present Antionette Fairy, BS, Exercise Physiologist;Susanne Bice, RN, BSN, CCRP;Melissa Salem, RDN, Tawanna Solo, MS, ASCM CEP, Exercise Physiologist    Virtual Visit No    Medication changes reported     No    Fall or balance concerns reported    No    Warm-up and Cool-down Performed on first and last piece of equipment    Resistance Training Performed Yes    VAD Patient? No    PAD/SET Patient? No      Pain Assessment   Currently in Pain? No/denies    Multiple Pain Sites No                Social History   Tobacco Use  Smoking Status Never  Smokeless Tobacco Never    Goals Met:  Independence with exercise equipment Exercise tolerated well No report of concerns or symptoms today  Goals Unmet:  Not Applicable  Comments: Pt able to follow exercise prescription today without complaint.  Will continue to monitor for progression.    Dr. Emily Filbert is Medical Director for [place].  Dr. Ottie Glazier is Medical Director for [facility] Pulmonary Rehabilitation.

## 2022-05-08 ENCOUNTER — Encounter: Payer: Medicare Other | Admitting: *Deleted

## 2022-05-08 DIAGNOSIS — I5022 Chronic systolic (congestive) heart failure: Secondary | ICD-10-CM

## 2022-05-08 NOTE — Progress Notes (Signed)
Daily Session Note  Patient Details  Name: Robin Ortiz MRN: [id number] Date of Birth: Jan 02, 1935 Referring Provider:   Flowsheet Row Pulmonary Rehab from 02/12/2022 in [facility] Cardiac and Pulmonary Rehab  Referring Provider Gollan       Encounter Date: 05/08/2022  Check In:  Session Check In - 05/08/22 1121       Check-In   Supervising physician immediately available to respond to emergencies See telemetry face sheet for immediately available ER MD    Location [facility]-Cardiac & Pulmonary Rehab    Staff Present Nyoka Cowden, RN, BSN, Willette Pa, MA, RCEP, CCRP, CCET;Susanne Bice, RN, BSN, CCRP    Virtual Visit Yes    Medication changes reported     No    Fall or balance concerns reported    No    Tobacco Cessation No Change    Warm-up and Cool-down Performed on first and last piece of equipment    Resistance Training Performed Yes    VAD Patient? No    PAD/SET Patient? No      Pain Assessment   Currently in Pain? No/denies                Social History   Tobacco Use  Smoking Status Never  Smokeless Tobacco Never    Goals Met:  Independence with exercise equipment Exercise tolerated well No report of concerns or symptoms today  Goals Unmet:  Not Applicable  Comments: Pt able to follow exercise prescription today without complaint.  Will continue to monitor for progression.   Dr. Emily Filbert is Medical Director for [place].  Dr. Ottie Glazier is Medical Director for [facility] Pulmonary Rehabilitation.

## 2022-05-09 ENCOUNTER — Encounter: Payer: Self-pay | Admitting: *Deleted

## 2022-05-09 DIAGNOSIS — I5022 Chronic systolic (congestive) heart failure: Secondary | ICD-10-CM

## 2022-05-09 NOTE — Progress Notes (Signed)
Pulmonary Individual Treatment Plan  Patient Details  Name: Robin Ortiz MRN: [id number] Date of Birth: 1935/05/05 Referring Provider:   Flowsheet Row Pulmonary Rehab from 02/12/2022 in [facility] Cardiac and Pulmonary Rehab  Referring Provider Gollan       Initial Encounter Date:  Flowsheet Row Pulmonary Rehab from 02/12/2022 in [facility] Cardiac and Pulmonary Rehab  Date 02/12/22       Visit Diagnosis: Heart failure, chronic systolic ([place])  Patient's Home Medications on Admission:  Current Outpatient Medications:    apixaban (ELIQUIS) 5 MG TABS tablet, Take 1 tablet (5 mg total) by mouth 2 (two) times daily., Disp: 60 tablet, Rfl: 6   Calcium-Vitamin D 600-200 MG-UNIT per tablet, Take 1 tablet by mouth daily., Disp: , Rfl:    celecoxib (CELEBREX) 200 MG capsule, Take 200 mg by mouth daily as needed for mild pain., Disp: , Rfl:    diclofenac Sodium (VOLTAREN) 1 % GEL, Apply 2 g topically daily as needed (Arthritis)., Disp: , Rfl:    diphenhydrAMINE-APAP, sleep, (TYLENOL PM EXTRA STRENGTH PO), Take 1 tablet by mouth at bedtime as needed (sleep). (Patient not taking: Reported on 04/30/2022), Disp: , Rfl:    furosemide (LASIX) 40 MG tablet, Take 0.5 tablets (20 mg total) by mouth as directed. Take half a tablet once daily and if weight gain of 3 pounds in 24 hours or 5 pounds in one week you may take additional 20 mg., Disp: 30 tablet, Rfl: 3   Misc Natural Products (OSTEO BI-FLEX ADV JOINT SHIELD PO), Take 1 tablet by mouth daily., Disp: , Rfl:    Multiple Vitamin (MULTIVITAMIN) tablet, Take 1 tablet by mouth daily. Alive, Disp: , Rfl:    potassium chloride (KLOR-CON) 10 MEQ tablet, Take 1 tablet (10 mEq total) by mouth every Monday, Wednesday, and Friday. (Patient taking differently: Take 10 mEq by mouth daily.), Disp: 39 tablet, Rfl: 3   raloxifene (EVISTA) 60 MG tablet, Take 60 mg by mouth daily., Disp: , Rfl:    sacubitril-valsartan (ENTRESTO) 97-103 MG, Take 1 tablet by mouth 2 (two)  times daily., Disp: 60 tablet, Rfl: 3   simvastatin (ZOCOR) 20 MG tablet, Take 20 mg by mouth at bedtime., Disp: , Rfl:   Past Medical History: Past Medical History:  Diagnosis Date   Anxiety    Cardiomegaly    Chronic fatigue    Chronic HFmrEF (heart failure with midrange ejection fraction) ([place])    a. 2002 Echo: EF 20-30%; b. 09/2015 Echo: EF 45-50%, diff HK. Mild MR. Mod dil LA; c. 10/2021 Echo: EF 35-40% (in setting of afib), no rwma, low-nl RV fxn, sev dil LA, mildly dil RA, mod MR, mild-mod TR.   Essential hypertension    Mixed hyperlipidemia    NICM (nonischemic cardiomyopathy) (HCC)    Osteoporosis    Persistent atrial fibrillation ([place])    a. Dx 10/2021. CHA2DS2VASc = 5-->Eliquis; b. 11/2021 s/p DCCV (120J).    Tobacco Use: Social History   Tobacco Use  Smoking Status Never  Smokeless Tobacco Never    Labs: Review Flowsheet        No data to display           Pulmonary Assessment Scores:  Pulmonary Assessment Scores     Row Name 02/12/22 1526         ADL UCSD   ADL Phase Entry     SOB Score total 53     Rest 3     Walk 2     Stairs  3     Bath 2     Dress 2     Shop 2       CAT Score   CAT Score 14       mMRC Score   mMRC Score 2              UCSD: Self-administered rating of dyspnea associated with activities of daily living (ADLs) 6-point scale (0 = "not at all" to 5 = "maximal or unable to do because of breathlessness")  Scoring Scores range from 0 to 120.  Minimally important difference is 5 units  CAT: CAT can identify the health impairment of COPD patients and is better correlated with disease progression.  CAT has a scoring range of zero to 40. The CAT score is classified into four groups of low (less than 10), medium (10 - 20), high (21-30) and very high (31-40) based on the impact level of disease on health status. A CAT score over 10 suggests significant symptoms.  A worsening CAT score could be explained by an exacerbation, poor  medication adherence, poor inhaler technique, or progression of COPD or comorbid conditions.  CAT MCID is 2 points  mMRC: mMRC (Modified Medical Research Council) Dyspnea Scale is used to assess the degree of baseline functional disability in patients of respiratory disease due to dyspnea. No minimal important difference is established. A decrease in score of 1 point or greater is considered a positive change.   Pulmonary Function Assessment:  Pulmonary Function Assessment - 02/01/22 0909       Breath   Shortness of Breath No             Exercise Target Goals: Exercise Program Goal: Individual exercise prescription set using results from initial 6 min walk test and THRR while considering  patient's activity barriers and safety.   Exercise Prescription Goal: Initial exercise prescription builds to 30-45 minutes a day of aerobic activity, 2-3 days per week.  Home exercise guidelines will be given to patient during program as part of exercise prescription that the participant will acknowledge.  Education: Aerobic Exercise: - Group verbal and visual presentation on the components of exercise prescription. Introduces F.I.T.T principle from ACSM for exercise prescriptions.  Reviews F.I.T.T. principles of aerobic exercise including progression. Written material given at graduation. Flowsheet Row Pulmonary Rehab from 04/12/2022 in [facility] Cardiac and Pulmonary Rehab  Education need identified 02/12/22       Education: Resistance Exercise: - Group verbal and visual presentation on the components of exercise prescription. Introduces F.I.T.T principle from ACSM for exercise prescriptions  Reviews F.I.T.T. principles of resistance exercise including progression. Written material given at graduation.    Education: Exercise & Equipment Safety: - Individual verbal instruction and demonstration of equipment use and safety with use of the equipment. Flowsheet Row Pulmonary Rehab from 04/12/2022 in  [facility] Cardiac and Pulmonary Rehab  Date 02/12/22  Educator [facility]  Instruction Review Code 1- Verbalizes Understanding       Education: Exercise Physiology & General Exercise Guidelines: - Group verbal and written instruction with models to review the exercise physiology of the cardiovascular system and associated critical values. Provides general exercise guidelines with specific guidelines to those with heart or lung disease.    Education: Flexibility, Balance, Mind/Body Relaxation: - Group verbal and visual presentation with interactive activity on the components of exercise prescription. Introduces F.I.T.T principle from ACSM for exercise prescriptions. Reviews F.I.T.T. principles of flexibility and balance exercise training including progression. Also discusses the mind body  connection.  Reviews various relaxation techniques to help reduce and manage stress (i.e. Deep breathing, progressive muscle relaxation, and visualization). Balance handout provided to take home. Written material given at graduation. Flowsheet Row Pulmonary Rehab from 04/12/2022 in [facility] Cardiac and Pulmonary Rehab  Date 02/15/22  Educator [facility]  Instruction Review Code 1- Verbalizes Understanding       Activity Barriers & Risk Stratification:   6 Minute Walk:  6 Minute Walk     Row Name 02/12/22 1512         6 Minute Walk   Phase Initial     Distance 860 feet     Walk Time 6 minutes     # of Rest Breaks 0     MPH 1.63     METS 1.48     RPE 13     Perceived Dyspnea  2     VO2 Peak 5.17     Symptoms Yes (comment)     Comments left hip pain when walking 3/10     Resting HR 63 bpm     Resting BP 140/80     Resting Oxygen Saturation  98 %     Exercise Oxygen Saturation  during 6 min walk 95 %     Max Ex. HR 75 bpm     Max Ex. BP 166/88     2 Minute Post BP 158/84       Interval HR   1 Minute HR 70     2 Minute HR 75     3 Minute HR 75     4 Minute HR 75     5 Minute HR 75     6 Minute HR 76     2  Minute Post HR 59     Interval Heart Rate? Yes       Interval Oxygen   Interval Oxygen? Yes     Baseline Oxygen Saturation % 98 %     1 Minute Oxygen Saturation % 96 %     1 Minute Liters of Oxygen 0 L     2 Minute Oxygen Saturation % 95 %     2 Minute Liters of Oxygen 0 L     3 Minute Oxygen Saturation % 96 %     3 Minute Liters of Oxygen 0 L     4 Minute Oxygen Saturation % 97 %     4 Minute Liters of Oxygen 0 L     5 Minute Oxygen Saturation % 96 %     5 Minute Liters of Oxygen 0 L     6 Minute Oxygen Saturation % 96 %     6 Minute Liters of Oxygen 0 L     2 Minute Post Oxygen Saturation % 98 %     2 Minute Post Liters of Oxygen 0 L             Oxygen Initial Assessment:  Oxygen Initial Assessment - 02/12/22 1649       Home Oxygen   Home Oxygen Device None    Sleep Oxygen Prescription None    Home Exercise Oxygen Prescription None    Home Resting Oxygen Prescription None    Compliance with Home Oxygen Use Yes      Initial 6 min Walk   Oxygen Used None      Program Oxygen Prescription   Program Oxygen Prescription None      Intervention   Short Term Goals To learn and  understand importance of monitoring SPO2 with pulse oximeter and demonstrate accurate use of the pulse oximeter.;To learn and understand importance of maintaining oxygen saturations>88%;To learn and demonstrate proper pursed lip breathing techniques or other breathing techniques.     Long  Term Goals Maintenance of O2 saturations>88%;Exhibits proper breathing techniques, such as pursed lip breathing or other method taught during program session;Verbalizes importance of monitoring SPO2 with pulse oximeter and return demonstration             Oxygen Re-Evaluation:  Oxygen Re-Evaluation     Row Name 02/13/22 0953 02/27/22 1017 04/05/22 1017 04/12/22 1010       Program Oxygen Prescription   Program Oxygen Prescription None None None None      Home Oxygen   Home Oxygen Device None None None  None    Sleep Oxygen Prescription None None None None    Home Exercise Oxygen Prescription None None None None    Home Resting Oxygen Prescription None None None None    Compliance with Home Oxygen Use Yes Yes Yes Yes      Goals/Expected Outcomes   Short Term Goals To learn and understand importance of monitoring SPO2 with pulse oximeter and demonstrate accurate use of the pulse oximeter.;To learn and understand importance of maintaining oxygen saturations>88%;To learn and demonstrate proper pursed lip breathing techniques or other breathing techniques.  To learn and understand importance of monitoring SPO2 with pulse oximeter and demonstrate accurate use of the pulse oximeter.;To learn and understand importance of maintaining oxygen saturations>88%;To learn and demonstrate proper pursed lip breathing techniques or other breathing techniques.  To learn and understand importance of monitoring SPO2 with pulse oximeter and demonstrate accurate use of the pulse oximeter.;To learn and understand importance of maintaining oxygen saturations>88%;To learn and demonstrate proper pursed lip breathing techniques or other breathing techniques.  To learn and understand importance of monitoring SPO2 with pulse oximeter and demonstrate accurate use of the pulse oximeter.;To learn and understand importance of maintaining oxygen saturations>88%;To learn and demonstrate proper pursed lip breathing techniques or other breathing techniques.     Long  Term Goals Maintenance of O2 saturations>88%;Exhibits proper breathing techniques, such as pursed lip breathing or other method taught during program session;Verbalizes importance of monitoring SPO2 with pulse oximeter and return demonstration Maintenance of O2 saturations>88%;Exhibits proper breathing techniques, such as pursed lip breathing or other method taught during program session;Verbalizes importance of monitoring SPO2 with pulse oximeter and return demonstration  Maintenance of O2 saturations>88%;Exhibits proper breathing techniques, such as pursed lip breathing or other method taught during program session;Verbalizes importance of monitoring SPO2 with pulse oximeter and return demonstration Maintenance of O2 saturations>88%;Exhibits proper breathing techniques, such as pursed lip breathing or other method taught during program session;Verbalizes importance of monitoring SPO2 with pulse oximeter and return demonstration    Comments Reviewed PLB technique with pt.  Talked about how it works and it's importance in maintaining their exercise saturations. Patient reports using PLB to help control SOB during exercise. Patient was encouraged to monitor SaO2 during exercise while exercising at home. Patient reports continuing to use PLB to help control shortness of breath during exercise and reports using it frequently. Patient was encouraged to monitor O2 during exercise while exercising at home. Patient reports continuing to use PLB to help control shortness of breath during exercise and reports using it frequently, she will take breaks as needed as well while doing her ADLs. She rpeorts having a pulse oximeter at home to use.    Goals/Expected  Outcomes Short: Become more profiecient at using PLB.   Long: Become independent at using PLB. Short: Continue to use PBL and start monitoring SaO2 at home during exercise. Long: Become independent with PLB and exercise program. Short: Continue to use PBL and start monitoring O2 at home during exercise. Long: Become independent with PLB and exercise program. Short: Continue to use PBL and start monitoring O2 at home during exercise. Long: Become independent with PLB and exercise program.             Oxygen Discharge (Final Oxygen Re-Evaluation):  Oxygen Re-Evaluation - 04/12/22 1010       Program Oxygen Prescription   Program Oxygen Prescription None      Home Oxygen   Home Oxygen Device None    Sleep Oxygen Prescription  None    Home Exercise Oxygen Prescription None    Home Resting Oxygen Prescription None    Compliance with Home Oxygen Use Yes      Goals/Expected Outcomes   Short Term Goals To learn and understand importance of monitoring SPO2 with pulse oximeter and demonstrate accurate use of the pulse oximeter.;To learn and understand importance of maintaining oxygen saturations>88%;To learn and demonstrate proper pursed lip breathing techniques or other breathing techniques.     Long  Term Goals Maintenance of O2 saturations>88%;Exhibits proper breathing techniques, such as pursed lip breathing or other method taught during program session;Verbalizes importance of monitoring SPO2 with pulse oximeter and return demonstration    Comments Patient reports continuing to use PLB to help control shortness of breath during exercise and reports using it frequently, she will take breaks as needed as well while doing her ADLs. She rpeorts having a pulse oximeter at home to use.    Goals/Expected Outcomes Short: Continue to use PBL and start monitoring O2 at home during exercise. Long: Become independent with PLB and exercise program.             Initial Exercise Prescription:  Initial Exercise Prescription - 02/12/22 1500       Date of Initial Exercise RX and Referring Provider   Date 02/12/22    Referring Provider Gollan      Oxygen   Maintain Oxygen Saturation 88% or higher      Treadmill   MPH 0.8    Grade 0    Minutes 15    METs 1.48      NuStep   Level 1    SPM 80    Minutes 15    METs 1.48      Biostep-RELP   Level 1    SPM 50    Minutes 15    METs 1.48      Prescription Details   Frequency (times per week) 2    Duration Progress to 30 minutes of continuous aerobic without signs/symptoms of physical distress      Intensity   THRR 40-80% of Max Heartrate 92-121    Ratings of Perceived Exertion 11-13    Perceived Dyspnea 0-4      Progression   Progression Continue to progress  workloads to maintain intensity without signs/symptoms of physical distress.      Resistance Training   Training Prescription Yes    Weight 2    Reps 10-15             Perform Capillary Blood Glucose checks as needed.  Exercise Prescription Changes:   Exercise Prescription Changes     Row Name 02/12/22 1500 02/22/22 1200 02/27/22 1000 03/07/22  0800 03/19/22 1600     Response to Exercise   Blood Pressure (Admit) 140/80 124/62 -- 120/58 148/76   Blood Pressure (Exercise) 166/88 -- -- 160/78 136/78   Blood Pressure (Exit) 120/60 132/68 -- 90/56 146/68   Heart Rate (Admit) 63 bpm 62 bpm -- 50 bpm 52 bpm   Heart Rate (Exercise) 76 bpm 65 bpm -- 83 bpm 69 bpm   Heart Rate (Exit) 49 bpm 60 bpm -- 60 bpm 52 bpm   Oxygen Saturation (Admit) 98 % 97 % -- 96 % 98 %   Oxygen Saturation (Exercise) 95 % 94 % -- 84 % 91 %   Oxygen Saturation (Exit) 96 % 97 % -- 98 % 97 %   Rating of Perceived Exertion (Exercise) 13 15 -- 13 13   Perceived Dyspnea (Exercise) 2 2 -- 1 0   Symptoms 3/10 hip pain with walking SOB -- none none   Comments 6 MWT results -- -- -- --   Duration -- Progress to 30 minutes of  aerobic without signs/symptoms of physical distress Progress to 30 minutes of  aerobic without signs/symptoms of physical distress Progress to 30 minutes of  aerobic without signs/symptoms of physical distress Continue with 30 min of aerobic exercise without signs/symptoms of physical distress.   Intensity -- THRR unchanged THRR unchanged THRR unchanged THRR unchanged     Progression   Progression -- Continue to progress workloads to maintain intensity without signs/symptoms of physical distress. Continue to progress workloads to maintain intensity without signs/symptoms of physical distress. Continue to progress workloads to maintain intensity without signs/symptoms of physical distress. Continue to progress workloads to maintain intensity without signs/symptoms of physical distress.   Average  METs -- 1.86 1.86 1.81 1.9     Resistance Training   Training Prescription -- Yes Yes Yes Yes   Weight -- 2 lb 2 lb 2 lb 2 lb   Reps -- 10-15 10-15 10-15 10-15     Interval Training   Interval Training -- No No No No     Treadmill   MPH -- 0.8 0.8 1.4 0.9   Grade -- 0 0 0 0   Minutes -- 15 15 15 15    METs -- 1.6 1.6 2.07 1.69     NuStep   Level -- 1 1 2 2    Minutes -- 15 15 15 15    METs -- 2 2 2  2.4     Biostep-RELP   Level -- 1 1 2  --   Minutes -- 15 15 15  --   METs -- 2 2 1.2 --     Home Exercise Plan   Plans to continue exercise at -- -- Home (comment)  walking at home outside or on TM and continue to do PT exercises. Home (comment)  walking at home outside or on TM and continue to do PT exercises. --   Frequency -- -- Add 2 additional days to program exercise sessions. Add 2 additional days to program exercise sessions. --   Initial Home Exercises Provided -- -- 02/27/22 02/27/22 --     Oxygen   Maintain Oxygen Saturation -- 88% or higher 88% or higher 88% or higher 88% or higher    Row Name 04/02/22 0900 04/16/22 0900 05/01/22 1300         Response to Exercise   Blood Pressure (Admit) 118/70 122/62 132/70     Blood Pressure (Exercise) 140/68 132/64 --     Blood Pressure (Exit) 108/56 126/74 124/82  Heart Rate (Admit) 62 bpm 52 bpm 64 bpm     Heart Rate (Exercise) 76 bpm 73 bpm 73 bpm     Heart Rate (Exit) 56 bpm 54 bpm 56 bpm     Oxygen Saturation (Admit) 98 % 98 % 98 %     Oxygen Saturation (Exercise) 94 % 88 % 91 %     Oxygen Saturation (Exit) 97 % 94 % 99 %     Rating of Perceived Exertion (Exercise) 13 14 15      Perceived Dyspnea (Exercise) 0 1 0     Symptoms none none none     Duration Continue with 30 min of aerobic exercise without signs/symptoms of physical distress. Continue with 30 min of aerobic exercise without signs/symptoms of physical distress. Continue with 30 min of aerobic exercise without signs/symptoms of physical distress.     Intensity  THRR unchanged THRR unchanged THRR unchanged       Progression   Progression Continue to progress workloads to maintain intensity without signs/symptoms of physical distress. Continue to progress workloads to maintain intensity without signs/symptoms of physical distress. Continue to progress workloads to maintain intensity without signs/symptoms of physical distress.     Average METs 1.85 2.29 2.09       Resistance Training   Training Prescription Yes Yes Yes     Weight 2 lb 2 lb 2 lb     Reps 10-15 10-15 10-15       Interval Training   Interval Training No No No       Treadmill   MPH 1.3 1 1.5     Grade 0 0 0.5     Minutes 15 15 15      METs 2 1.77 2.26       Recumbant Bike   Level -- 2 --     Minutes -- 15 --     METs -- 3.2 --       NuStep   Level 3 3 4      Minutes 15 30 15      METs 1.8 2.3 2       Biostep-RELP   Level 1 -- 1     Minutes 15 -- 15     METs 2 -- --       Home Exercise Plan   Plans to continue exercise at Home (comment)  walking at home outside or on TM and continue to do PT exercises. Home (comment)  walking at home outside or on TM and continue to do PT exercises. Home (comment)  walking at home outside or on TM and continue to do PT exercises.     Frequency Add 2 additional days to program exercise sessions. Add 2 additional days to program exercise sessions. Add 2 additional days to program exercise sessions.     Initial Home Exercises Provided 02/27/22 02/27/22 02/27/22       Oxygen   Maintain Oxygen Saturation 88% or higher 88% or higher 88% or higher              Exercise Comments:   Exercise Comments     Row Name 02/13/22 8264           Exercise Comments First full day of exercise!  Patient was oriented to gym and equipment including functions, settings, policies, and procedures.  Patient's individual exercise prescription and treatment plan were reviewed.  All starting workloads were established based on the results of the 6 minute  walk test done at initial orientation  visit.  The plan for exercise progression was also introduced and progression will be customized based on patient's performance and goals.                Exercise Goals and Review:   Exercise Goals     Row Name 02/12/22 1519             Exercise Goals   Increase Physical Activity Yes       Intervention Provide advice, education, support and counseling about physical activity/exercise needs.;Develop an individualized exercise prescription for aerobic and resistive training based on initial evaluation findings, risk stratification, comorbidities and participant's personal goals.       Expected Outcomes Short Term: Attend rehab on a regular basis to increase amount of physical activity.;Long Term: Add in home exercise to make exercise part of routine and to increase amount of physical activity.;Long Term: Exercising regularly at least 3-5 days a week.       Increase Strength and Stamina Yes       Intervention Provide advice, education, support and counseling about physical activity/exercise needs.;Develop an individualized exercise prescription for aerobic and resistive training based on initial evaluation findings, risk stratification, comorbidities and participant's personal goals.       Expected Outcomes Short Term: Increase workloads from initial exercise prescription for resistance, speed, and METs.;Short Term: Perform resistance training exercises routinely during rehab and add in resistance training at home;Long Term: Improve cardiorespiratory fitness, muscular endurance and strength as measured by increased METs and functional capacity (6MWT)       Able to understand and use rate of perceived exertion (RPE) scale Yes       Intervention Provide education and explanation on how to use RPE scale       Expected Outcomes Short Term: Able to use RPE daily in rehab to express subjective intensity level;Long Term:  Able to use RPE to guide intensity level  when exercising independently       Able to understand and use Dyspnea scale Yes       Intervention Provide education and explanation on how to use Dyspnea scale       Expected Outcomes Short Term: Able to use Dyspnea scale daily in rehab to express subjective sense of shortness of breath during exertion;Long Term: Able to use Dyspnea scale to guide intensity level when exercising independently       Knowledge and understanding of Target Heart Rate Range (THRR) Yes       Intervention Provide education and explanation of THRR including how the numbers were predicted and where they are located for reference       Expected Outcomes Short Term: Able to state/look up THRR;Long Term: Able to use THRR to govern intensity when exercising independently;Short Term: Able to use daily as guideline for intensity in rehab       Able to check pulse independently Yes       Intervention Provide education and demonstration on how to check pulse in carotid and radial arteries.;Review the importance of being able to check your own pulse for safety during independent exercise       Expected Outcomes Short Term: Able to explain why pulse checking is important during independent exercise;Long Term: Able to check pulse independently and accurately       Understanding of Exercise Prescription Yes       Intervention Provide education, explanation, and written materials on patient's individual exercise prescription       Expected Outcomes Short Term: Able  to explain program exercise prescription;Long Term: Able to explain home exercise prescription to exercise independently                Exercise Goals Re-Evaluation :  Exercise Goals Re-Evaluation     Row Name 02/13/22 3500 02/22/22 1241 02/27/22 1000 03/07/22 0847 03/19/22 1608     Exercise Goal Re-Evaluation   Exercise Goals Review Increase Physical Activity;Able to understand and use rate of perceived exertion (RPE) scale;Knowledge and understanding of Target  Heart Rate Range (THRR);Understanding of Exercise Prescription;Able to understand and use Dyspnea scale;Able to check pulse independently;Increase Strength and Stamina Increase Physical Activity;Increase Strength and Stamina;Understanding of Exercise Prescription Increase Physical Activity;Increase Strength and Stamina;Able to understand and use rate of perceived exertion (RPE) scale;Able to understand and use Dyspnea scale;Knowledge and understanding of Target Heart Rate Range (THRR);Able to check pulse independently;Understanding of Exercise Prescription Increase Physical Activity;Increase Strength and Stamina;Understanding of Exercise Prescription Increase Physical Activity;Increase Strength and Stamina;Understanding of Exercise Prescription   Comments Reviewed RPE and dyspnea scales, THR and program prescription with pt today.  Pt voiced understanding and was given a copy of goals to take home. Robin Ortiz is off to a good start in rehab. She has completed her first four full days of exercise thus far.  We will continue to montior her progress. Reviewed home exercise with pt today.  Pt plans to walk and do PT exercises for exercise.  Reviewed THR, pulse, RPE, sign and symptoms, pulse oximetery and when to call 911 or MD.  Also discussed weather considerations and indoor options.  Pt voiced understanding. Robin Ortiz has been doing well in rehab. She improved to level 2 on the biostep machine, and the T4 NuStep. She has also increased her speed on the treadmill to 1.4 mph. She has tolerated using 2 lb hand weights for resistance training. We will continue to monitor her progress in the program. Robin Ortiz continues to do well in rehab. She was out of town the last week and only came for a couple sessions during this review. Walking on the treadmill is a challenge for her and we hope to see her increase her speed over time. She would benefit from increasing her handweights to 3 lbs. Will continue to monitor.   Expected  Outcomes Short: Use RPE daily to regulate intensity. Long: Follow program prescription in THR. Short: Continue to attend rehab regularly Long: Continue to follow program prescription Short: add 1-2 days of exercise at home on off days of class. Long: become independent with exercise routine. Short: continue to increase speed on treadmill. Long: Continue to increase strength and stamina. Short: Increase handweights to 3 lbs Long: Continue to increase overall MET level    Row Name 04/02/22 0940 04/05/22 1014 04/12/22 1010 04/16/22 0906 05/01/22 1403     Exercise Goal Re-Evaluation   Exercise Goals Review Increase Physical Activity;Increase Strength and Stamina;Understanding of Exercise Prescription Increase Physical Activity;Increase Strength and Stamina;Understanding of Exercise Prescription Increase Physical Activity;Increase Strength and Stamina;Understanding of Exercise Prescription Increase Physical Activity;Increase Strength and Stamina;Understanding of Exercise Prescription Increase Physical Activity;Increase Strength and Stamina;Understanding of Exercise Prescription   Comments Robin Ortiz is doing well in rehab. She improved to level 3 on the T4. She also worked her speed back up to 1.3 mph on the treadmill. Robin Ortiz is up to 2 METs on the BioStep as well. We will continue to monitor her progress in the program. Robin Ortiz reports staying active, but not doing any exercise outside of rehab. She reports she is not  as strong as she used to be. Reviewed that she could use her PT exercises and walking for exercise outside of rehab as she discussed with EP when going over home exercise. She reports that she has used cans of soup in the past for weighted exercise. Robin Ortiz reports staying active and going in her yard, but not doing any exercise outside of rehab. She reports she is not as strong as she used to be and feels weak. Reviewed again that she could use her PT exercises and walking for exercise outside of  rehab as she discussed with EP when going over home exercise. Robin Ortiz is doing well in rehab. She started using the recumbent bike at level 2 and did well. She also improved her overall average MET level to 2.29 METs. She has also tolerated using 2 lb hand weights for resistance training and may benefit from trying 3 lbs. We will continue to monitor her progress. Robin Ortiz is doing well in rehab. She recently improved to 1.5 mph and an incline of 0.5% on the treadmill. She also went up to level 4 on the T4. She has also tolerated using 2 lb hand weights for resistance training as well. We will continue to monitor her progress in the program.   Expected Outcomes Short: Increase handweights to 3 lbs Long: Continue to improve strength and stamina. Short: begin structured exercise outside of rehab Long: Continue to improve strength and stamina. Short: begin structured exercise outside of rehab Long: Continue to improve strength and stamina. Short: try using 3 lb hand weights. Long: Continue to improve overall MET levels. Short: try using 3 lb hand weights. Long: Continue to improve overall MET levels.            Discharge Exercise Prescription (Final Exercise Prescription Changes):  Exercise Prescription Changes - 05/01/22 1300       Response to Exercise   Blood Pressure (Admit) 132/70    Blood Pressure (Exit) 124/82    Heart Rate (Admit) 64 bpm    Heart Rate (Exercise) 73 bpm    Heart Rate (Exit) 56 bpm    Oxygen Saturation (Admit) 98 %    Oxygen Saturation (Exercise) 91 %    Oxygen Saturation (Exit) 99 %    Rating of Perceived Exertion (Exercise) 15    Perceived Dyspnea (Exercise) 0    Symptoms none    Duration Continue with 30 min of aerobic exercise without signs/symptoms of physical distress.    Intensity THRR unchanged      Progression   Progression Continue to progress workloads to maintain intensity without signs/symptoms of physical distress.    Average METs 2.09      Resistance  Training   Training Prescription Yes    Weight 2 lb    Reps 10-15      Interval Training   Interval Training No      Treadmill   MPH 1.5    Grade 0.5    Minutes 15    METs 2.26      NuStep   Level 4    Minutes 15    METs 2      Biostep-RELP   Level 1    Minutes 15      Home Exercise Plan   Plans to continue exercise at Home (comment)   walking at home outside or on TM and continue to do PT exercises.   Frequency Add 2 additional days to program exercise sessions.    Initial Home Exercises Provided 02/27/22  Oxygen   Maintain Oxygen Saturation 88% or higher             Nutrition:  Target Goals: Understanding of nutrition guidelines, daily intake of sodium <1591m, cholesterol <2073m calories 30% from fat and 7% or less from saturated fats, daily to have 5 or more servings of fruits and vegetables.  Education: All About Nutrition: -Group instruction provided by verbal, written material, interactive activities, discussions, models, and posters to present general guidelines for heart healthy nutrition including fat, fiber, MyPlate, the role of sodium in heart healthy nutrition, utilization of the nutrition label, and utilization of this knowledge for meal planning. Follow up email sent as well. Written material given at graduation. [facility]  Education need identified 02/12/22  Date 02/22/22  Educator MCFreeportInstruction Review Code 1- Verbalizes Understanding       Biometrics:  Pre Biometrics - 02/12/22 1520       Pre Biometrics   Height 5' 5"  (1.651 m)    Weight 143 lb 11.2 oz (65.2 kg)    BMI (Calculated) 23.91    Single Leg Stand 2.1 seconds              Nutrition Therapy Plan and Nutrition Goals:  Nutrition Therapy & Goals - 02/22/22 1215       Nutrition Therapy   Diet Heart healthy, low Na    Drug/Food Interactions Statins/Certain Fruits    Protein (specify units)  60-65g    Fiber 21 grams    Whole Grain Foods 3 servings    Saturated Fats 12 max. grams    Fruits and Vegetables 8 servings/day    Sodium 2 grams      Personal Nutrition Goals   Nutrition Goal ST: add protein/fat to snacks (peanut butter, cottage cheese, cheese cubes), try protein ball recipe LT: Maintain weight, meet calorie/protein needs    Comments [age].[id number]. F admitted to rehab for chronic systolic heart failure. PMHx includes HTN, HLD, osteoporosis, NICM, anxiety, cardiomegaly, chronic fatigue. Relevant medications includes Ca-vit D, lasix, K+, MVI, evesta, zocor. PYP Score: 61. Vegetables & Fruits 8/12. Breads, Grains & Cereals 5/12. Red & Processed Meat 8/12. Poultry 2/2. Fish & Shellfish 1/4. Beans, Nuts & Seeds 2/4. Milk & Dairy Foods 3/6. Toppings, Oils, Seasonings & Salt 12/20. Sweets, Snacks & Restaurant Food 11/14. Beverages 9/10.  MaLillyonaries to eat smaller, more frequent meals as her appetite has been lower. She reports her doctor told her not to lose anymore weight. She enjoys fruit and vegetables and keeps starchy vegetables and grains to a minimum on her plate. She enjoys chicken as her primary source of protein and is open to including other sources of protein for snacks such as peanut butter, cheese, and cottage cheese to add with her fruit to bump up the nutrition. She uses olive oil and some butter for cooking and she does not salt her food. For breakfast she enjoys having a bar or cream of wehat or oatmeal - encouraged her to have oatmeal more often and bulking up it with fruit like berries, and nuts/seeds. She enjoys sourdough bread, but does not eat it frequently. She used to enjoy sweets more, but has not had as much of a taste for them - discussed another snack option such as energy balls that include nut/seed butter, oats, some liquid sweetener or water to add mositure, and add ins like dried friet, chocolate chips, or cocoa powder - she  was interested in this. She limits her  fluid, but water is her drink of choice. Discussed general heart healthy eating.      Intervention Plan   Intervention Prescribe, educate and counsel regarding individualized specific dietary modifications aiming towards targeted core components such as weight, hypertension, lipid management, diabetes, heart failure and other comorbidities.    Expected Outcomes Short Term Goal: Understand basic principles of dietary content, such as calories, fat, sodium, cholesterol and nutrients.;Short Term Goal: A plan has been developed with personal nutrition goals set during dietitian appointment.;Long Term Goal: Adherence to prescribed nutrition plan.             Nutrition Assessments:  MEDIFICTS Score Key: ?70 Need to make dietary changes  40-70 Heart Healthy Diet ? 40 Therapeutic Level Cholesterol Diet  Flowsheet Row Pulmonary Rehab from 02/12/2022 in [facility] Cardiac and Pulmonary Rehab  Picture Your Plate Total Score on Admission 61      Picture Your Plate Scores: <09 Unhealthy dietary pattern with much room for improvement. 41-50 Dietary pattern unlikely to meet recommendations for good health and room for improvement. 51-60 More healthful dietary pattern, with some room for improvement.  >60 Healthy dietary pattern, although there may be some specific behaviors that could be improved.   Nutrition Goals Re-Evaluation:  Nutrition Goals Re-Evaluation     Robin Ortiz Name 04/05/22 0957 04/12/22 1007           Goals   Nutrition Goal ST: continue to add protein/fat to snacks (peanut butter, cottage cheese, cheese cubes), try protein ball recipe LT: Maintain weight, meet calorie/protein needs ST: continue to add protein/fat to snacks (peanut butter, cottage cheese, cheese cubes), try protein ball recipe LT: Maintain weight, meet calorie/protein needs      Comment Robin Ortiz reports continuing to make herself eat - due to her chronic nausea. She continues to include fruits, vegetables, lean proteins,  and heart healthy fats. She has been having cottage cheese and peanut butter during the day as well.  She has not tried the protein ball recipe yet, but would like to. Robin Ortiz reports continuing to make herself eat - due to her chronic nausea. She continues to include fruits, vegetables, lean proteins, and heart healthy fats. She continues to have cottage cheese and peanut butter during the day as well.  She has still not tried the protein ball recipe yet, but would like to.      Expected Outcome ST: continue to add protein/fat to snacks (peanut butter, cottage cheese, cheese cubes), try protein ball recipe LT: Maintain weight, meet calorie/protein needs ST: continue to add protein/fat to snacks (peanut butter, cottage cheese, cheese cubes), try protein ball recipe LT: Maintain weight, meet calorie/protein needs               Nutrition Goals Discharge (Final Nutrition Goals Re-Evaluation):  Nutrition Goals Re-Evaluation - 04/12/22 1007       Goals   Nutrition Goal ST: continue to add protein/fat to snacks (peanut butter, cottage cheese, cheese cubes), try protein ball recipe LT: Maintain weight, meet calorie/protein needs    Comment Robin Ortiz reports continuing to make herself eat - due to her chronic nausea. She continues to include fruits, vegetables, lean proteins, and heart healthy fats. She continues to have cottage cheese and peanut butter during the day as well.  She has still not tried the protein ball recipe yet, but would like to.    Expected Outcome ST: continue to add protein/fat to snacks (peanut butter, cottage cheese, cheese  cubes), try protein ball recipe LT: Maintain weight, meet calorie/protein needs             Psychosocial: Target Goals: Acknowledge presence or absence of significant depression and/or stress, maximize coping skills, provide positive support system. Participant is able to verbalize types and ability to use techniques and skills needed for reducing stress  and depression.   Education: Stress, Anxiety, and Depression - Group verbal and visual presentation to define topics covered.  Reviews how body is impacted by stress, anxiety, and depression.  Also discusses healthy ways to reduce stress and to treat/manage anxiety and depression.  Written material given at graduation. Flowsheet Row Pulmonary Rehab from 04/12/2022 in [facility] Cardiac and Pulmonary Rehab  Date 03/22/22  Educator [facility]  Instruction Review Code 1- [employer] Understanding       Education: Sleep Hygiene -Provides group verbal and written instruction about how sleep can affect your health.  Define sleep hygiene, discuss sleep cycles and impact of sleep habits. Review good sleep hygiene tips.    Initial Review & Psychosocial Screening:  Initial Psych Review & Screening - 02/01/22 0913       Initial Review   Current issues with None Identified      Family Dynamics   Good Support System? Yes    Comments Ranika is in good spirtirs and has four kids that she can turn to for support. She has her husband that she looks after at times and has great support from him.      Barriers   Psychosocial barriers to participate in program The patient should benefit from training in stress management and relaxation.;There are no identifiable barriers or psychosocial needs.      Screening Interventions   Interventions Encouraged to exercise;Provide feedback about the scores to participant;To provide support and resources with identified psychosocial needs    Expected Outcomes Long Term Goal: Stressors or current issues are controlled or eliminated.;Short Term goal: Utilizing psychosocial counselor, staff and physician to assist with identification of specific Stressors or current issues interfering with healing process. Setting desired goal for each stressor or current issue identified.;Short Term goal: Identification and review with participant of any Quality of Life or Depression concerns found  by scoring the questionnaire.;Long Term goal: The participant improves quality of Life and PHQ9 Scores as seen by post scores and/or verbalization of changes             Quality of Life Scores:  Quality of Life - 02/12/22 1523       Quality of Life   Select Quality of Life      Quality of Life Scores   Health/Function Pre 22.23 %    Socioeconomic Pre 24 %    Psych/Spiritual Pre 23.36 %    Family Pre 25.2 %    GLOBAL Pre 23.31 %            Scores of 19 and below usually indicate a poorer quality of life in these areas.  A difference of  2-3 points is a clinically meaningful difference.  A difference of 2-3 points in the total score of the Quality of Life Index has been associated with significant improvement in overall quality of life, self-image, physical symptoms, and general health in studies assessing change in quality of life.  PHQ-9: Review Flowsheet       04/05/2022 03/15/2022 02/12/2022  Depression screen PHQ 2/9  Decreased Interest 1 1 1   Down, Depressed, Hopeless 0 1 1  PHQ - 2 Score 1  2 2  Altered sleeping 0 1 1  Tired, decreased energy 0 1 1  Change in appetite 0 1 3  Feeling bad or failure about yourself  1 0 1  Trouble concentrating 0 0 0  Moving slowly or fidgety/restless 0 0 1  Suicidal thoughts 0 0 0  PHQ-9 Score 2 5 9   Difficult doing work/chores Somewhat difficult Somewhat difficult Somewhat difficult   Interpretation of Total Score  Total Score Depression Severity:  1-4 = Minimal depression, 5-9 = Mild depression, 10-14 = Moderate depression, 15-19 = Moderately severe depression, 20-27 = Severe depression   Psychosocial Evaluation and Intervention:  Psychosocial Evaluation - 02/01/22 0915       Psychosocial Evaluation & Interventions   Interventions Relaxation education;Stress management education;Encouraged to exercise with the program and follow exercise prescription    Comments Chaquetta is in good spirtirs and has four kids that she can turn  to for support. She has her husband that she looks after at times and has great support from him.    Expected Outcomes Short: Start [facility] to help with mood. Long: Maintain a healthy mental state.    Continue Psychosocial Services  Follow up required by staff             Psychosocial Re-Evaluation:  Psychosocial Re-Evaluation     [place] Name 02/27/22 1023 04/05/22 1000 04/12/22 1003         Psychosocial Re-Evaluation   Current issues with None Identified Current Stress Concerns Current Stress Concerns;Current Sleep Concerns     Comments Patient reports that she does have some trouble getting back to sleep around 3 am, but for the most part sleeps well and has no new stress concerns. She went up to her house by [facility] last weekend, she has missed going - her husband does not like to go and he needs her to help to remember to take his medications. She reports always having stress looking after her husband. She relies on "the good lord" for support as well her husbands Network engineer and daughters. She is also getting help right now from a maid. She reports feeling tired as she is always busy and she is more tired the day after coming to rehab. She has a poor appetite sometimes due to chronic nausea. She reports her friends have either passed or are in nursing homes - she will visit occassionally. She sometimes will take a nap, watch Murder She Wrote, sit and watch the birds, and play with her dog to help to reduce stress. Robin Ortiz recently had a birthday and went to see a movie ([place]). She reports no major stressors at this time, but she always has things going on. This weekend she went to a family event and had a good time, but she is tired this week because of it. She continues to feel weak and get worn out easily from how busy she is. She reports always having stress looking after her husband. She continues to rely on "the good lord" for support as well her husbands Network engineer  and daughters. She is also getting help right now from a maid which she reports has really changed her life. She sometimes will take a nap, enjoy mysteries, sit and watch the birds, and play with her dog to help to reduce stress. She continues to sleep "too well"; she gets about 7 hours of sleep a night and will take a nap as well.     Expected Outcomes Short: continue  to attend pulmonary rehab for mental health benefits. Long: maintain good mental health habits independently ST: continue to attend pulmonary rehab for mental health benefits. LT: maintain good mental health habits independently ST: continue to attend pulmonary rehab for mental health benefits. LT: maintain good mental health habits independently     Interventions Encouraged to attend Pulmonary Rehabilitation for the exercise Encouraged to attend Pulmonary Rehabilitation for the exercise Encouraged to attend Pulmonary Rehabilitation for the exercise     Continue Psychosocial Services  Follow up required by staff Follow up required by staff Follow up required by staff       Initial Review   Source of Stress Concerns -- Family Family              Psychosocial Discharge (Final Psychosocial Re-Evaluation):  Psychosocial Re-Evaluation - 04/12/22 1003       Psychosocial Re-Evaluation   Current issues with Current Stress Concerns;Current Sleep Concerns    Comments Robin Ortiz recently had a birthday and went to see a movie ([place]). She reports no major stressors at this time, but she always has things going on. This weekend she went to a family event and had a good time, but she is tired this week because of it. She continues to feel weak and get worn out easily from how busy she is. She reports always having stress looking after her husband. She continues to rely on "the good lord" for support as well her husbands Network engineer and daughters. She is also getting help right now from a maid which she reports has really changed her  life. She sometimes will take a nap, enjoy mysteries, sit and watch the birds, and play with her dog to help to reduce stress. She continues to sleep "too well"; she gets about 7 hours of sleep a night and will take a nap as well.    Expected Outcomes ST: continue to attend pulmonary rehab for mental health benefits. LT: maintain good mental health habits independently    Interventions Encouraged to attend Pulmonary Rehabilitation for the exercise    Continue Psychosocial Services  Follow up required by staff      Initial Review   Source of Stress Concerns Family             Education: Education Goals: Education classes will be provided on a weekly basis, covering required topics. Participant will state understanding/return demonstration of topics presented.  Learning Barriers/Preferences:  Learning Barriers/Preferences - 02/01/22 0909       Learning Barriers/Preferences   Learning Barriers None    Learning Preferences None             General Pulmonary Education Topics:  Infection Prevention: - Provides verbal and written material to individual with discussion of infection control including proper hand washing and proper equipment cleaning during exercise session. Flowsheet Row Pulmonary Rehab from 04/12/2022 in [facility] Cardiac and Pulmonary Rehab  Date 02/12/22  Educator [facility]  Instruction Review Code 1- Verbalizes Understanding       Falls Prevention: - Provides verbal and written material to individual with discussion of falls prevention and safety. Flowsheet Row Pulmonary Rehab from 04/12/2022 in [facility] Cardiac and Pulmonary Rehab  Date 02/12/22  Educator [facility]  Instruction Review Code 1- Verbalizes Understanding       Chronic Lung Disease Review: - Group verbal instruction with posters, models, PowerPoint presentations and videos,  to review new updates, new respiratory medications, new advancements in procedures and treatments. Providing information on websites and  "  800" numbers for continued self-education. Includes information about supplement oxygen, available portable oxygen systems, continuous and intermittent flow rates, oxygen safety, concentrators, and Medicare reimbursement for oxygen. Explanation of Pulmonary Drugs, including class, frequency, complications, importance of spacers, rinsing mouth after steroid MDI's, and proper cleaning methods for nebulizers. Review of basic lung anatomy and physiology related to function, structure, and complications of lung disease. Review of risk factors. Discussion about methods for diagnosing sleep apnea and types of masks and machines for OSA. Includes a review of the use of types of environmental controls: home humidity, furnaces, filters, dust mite/pet prevention, HEPA vacuums. Discussion about weather changes, air quality and the benefits of nasal washing. Instruction on Warning signs, infection symptoms, calling MD promptly, preventive modes, and value of vaccinations. Review of effective airway clearance, coughing and/or vibration techniques. Emphasizing that all should Create an Action Plan. Written material given at graduation. Flowsheet Row Pulmonary Rehab from 04/12/2022 in [facility] Cardiac and Pulmonary Rehab  Date 03/15/22  Educator [facility]  Instruction Review Code 1- Verbalizes Understanding       AED/CPR: - Group verbal and written instruction with the use of models to demonstrate the basic use of the AED with the basic ABC's of resuscitation.    Anatomy and Cardiac Procedures: - Group verbal and visual presentation and models provide information about basic cardiac anatomy and function. Reviews the testing methods done to diagnose heart disease and the outcomes of the test results. Describes the treatment choices: Medical Management, Angioplasty, or Coronary Bypass Surgery for treating various heart conditions including Myocardial Infarction, Angina, Valve Disease, and Cardiac Arrhythmias.  Written material  given at graduation. Flowsheet Row Pulmonary Rehab from 04/12/2022 in [facility] Cardiac and Pulmonary Rehab  Education need identified 02/12/22  Date 04/12/22  Educator SB  Instruction Review Code 1- Verbalizes Understanding       Medication Safety: - Group verbal and visual instruction to review commonly prescribed medications for heart and lung disease. Reviews the medication, class of the drug, and side effects. Includes the steps to properly store meds and maintain the prescription regimen.  Written material given at graduation.   Other: -Provides group and verbal instruction on various topics (see comments)   Knowledge Questionnaire Score:  Knowledge Questionnaire Score - 02/12/22 1526       Knowledge Questionnaire Score   Pre Score 22/26              Core Components/Risk Factors/Patient Goals at Admission:  Personal Goals and Risk Factors at Admission - 02/12/22 1523       Core Components/Risk Factors/Patient Goals on Admission    Weight Management Yes;Weight Maintenance    Intervention Weight Management: Develop a combined nutrition and exercise program designed to reach desired caloric intake, while maintaining appropriate intake of nutrient and fiber, sodium and fats, and appropriate energy expenditure required for the weight goal.;Weight Management: Provide education and appropriate resources to help participant work on and attain dietary goals.;Weight Management/Obesity: Establish reasonable short term and long term weight goals.    Admit Weight 143 lb 11.2 oz (65.2 kg)    Goal Weight: Short Term 143 lb 11.2 oz (65.2 kg)    Goal Weight: Long Term 143 lb 11.2 oz (65.2 kg)    Expected Outcomes Short Term: Continue to assess and modify interventions until short term weight is achieved;Long Term: Adherence to nutrition and physical activity/exercise program aimed toward attainment of established weight goal;Weight Maintenance: Understanding of the daily nutrition guidelines,  which includes 25-35% calories from fat,  7% or less cal from saturated fats, less than 267m cholesterol, less than 1.5gm of sodium, & 5 or more servings of fruits and vegetables daily;Understanding recommendations for meals to include 15-35% energy as protein, 25-35% energy from fat, 35-60% energy from carbohydrates, less than 2064mof dietary cholesterol, 20-35 gm of total fiber daily;Understanding of distribution of calorie intake throughout the day with the consumption of 4-5 meals/snacks    Improve shortness of breath with ADL's Yes    Intervention Provide education, individualized exercise plan and daily activity instruction to help decrease symptoms of SOB with activities of daily living.    Expected Outcomes Short Term: Improve cardiorespiratory fitness to achieve a reduction of symptoms when performing ADLs;Long Term: Be able to perform more ADLs without symptoms or delay the onset of symptoms    Heart Failure Yes    Intervention Provide a combined exercise and nutrition program that is supplemented with education, support and counseling about heart failure. Directed toward relieving symptoms such as shortness of breath, decreased exercise tolerance, and extremity edema.    Expected Outcomes Improve functional capacity of life;Short term: Attendance in program 2-3 days a week with increased exercise capacity. Reported lower sodium intake. Reported increased fruit and vegetable intake. Reports medication compliance.;Short term: Daily weights obtained and reported for increase. Utilizing diuretic protocols set by physician.;Long term: Adoption of self-care skills and reduction of barriers for early signs and symptoms recognition and intervention leading to self-care maintenance.    Hypertension Yes    Intervention Provide education on lifestyle modifcations including regular physical activity/exercise, weight management, moderate sodium restriction and increased consumption of fresh fruit, vegetables,  and low fat dairy, alcohol moderation, and smoking cessation.;Monitor prescription use compliance.    Expected Outcomes Short Term: Continued assessment and intervention until BP is < 140/904mG in hypertensive participants. < 130/57m81m in hypertensive participants with diabetes, heart failure or chronic kidney disease.;Long Term: Maintenance of blood pressure at goal levels.    Lipids Yes    Intervention Provide education and support for participant on nutrition & aerobic/resistive exercise along with prescribed medications to achieve LDL <70mg51mL >40mg.27mExpected Outcomes Short Term: Participant states understanding of desired cholesterol values and is compliant with medications prescribed. Participant is following exercise prescription and nutrition guidelines.;Long Term: Cholesterol controlled with medications as prescribed, with individualized exercise RX and with personalized nutrition plan. Value goals: LDL < 70mg, 24m> 40 mg.             Education:Diabetes - Individual verbal and written instruction to review signs/symptoms of diabetes, desired ranges of glucose level fasting, after meals and with exercise. Acknowledge that pre and post exercise glucose checks will be done for 3 sessions at entry of program.   Know Your Numbers and Heart Failure: - Group verbal and visual instruction to discuss disease risk factors for cardiac and pulmonary disease and treatment options.  Reviews associated critical values for Overweight/Obesity, Hypertension, Cholesterol, and Diabetes.  Discusses basics of heart failure: signs/symptoms and treatments.  Introduces Heart Failure Zone chart for action plan for heart failure.  Written material given at graduation. Flowsheet Row Pulmonary Rehab [facility]12[facility] and Pulmonary Rehab  Education need identified 02/12/22       Core Components/Risk Factors/Patient Goals Review:   Goals and Risk Factor Review     Row Name 02/27/22 1021  04/05/22 1014 04/12/22 1008         Core Components/Risk Factors/Patient Goals Review   Personal  Goals Review Weight Management/Obesity;Heart Failure;Develop more efficient breathing techniques such as purse lipped breathing and diaphragmatic breathing and practicing self-pacing with activity.;Hypertension;Lipids;Improve shortness of breath with ADL's Weight Management/Obesity;Heart Failure;Develop more efficient breathing techniques such as purse lipped breathing and diaphragmatic breathing and practicing self-pacing with activity.;Hypertension;Lipids;Improve shortness of breath with ADL's Weight Management/Obesity;Heart Failure;Develop more efficient breathing techniques such as purse lipped breathing and diaphragmatic breathing and practicing self-pacing with activity.;Hypertension;Lipids;Improve shortness of breath with ADL's     Review Patient reports that she monitors BP at home and it varies depending on the day. She does report taking all meds as prescribed. He weight has been consistent. Robin Ortiz reports losing about 8 pounds before coming here to her medications, but has now been weight stable. She reports thats she has been short of breath, but will pause to catch her breath. She reports continuing to use PLB to help control shortness of breath during exercise and reports using it frequently. She has a pulse oximeter at home as well. She checks her BP daily - 124/60 this morning at home, it will get to be 664Q/034V for systolic BP, but her HR is on the lower end. She reports not seeing her cardiologist until the end of August and has an echocardiogram at the beginning of August. Robin Ortiz reports that has now been weight stable. She reports thats she has been short of breath, but will pause to catch her breath. She reports continuing to use PLB to help control shortness of breath during exercise and reports using it frequently. She has a pulse oximeter at home as well. She checks her BP daily -   122/62 this morning at rehb, she reports that is will rang from 425/956L for systolic BP, but her HR is on the lower end. She reports not seeing her cardiologist until the end of August and has an echocardiogram at the beginning of August. She continues to take her medications as prescribed, she reports no issues aside from chronic nausea which she has reported to her doctors. Encouraged her to talk with her doctor about getting tired more easily and feeling weak.     Expected Outcomes Short: contiue to monitor BP and weight at home. Long: continue healthy behaviors to control cardiac risk factors. Short: contiue to monitor BP and weight at home. Long: continue healthy behaviors to control cardiac risk factors. Short: contiue to monitor BP and weight at home, soeak with doctor. Long: continue healthy behaviors to control cardiac risk factors.              Core Components/Risk Factors/Patient Goals at Discharge (Final Review):   Goals and Risk Factor Review - 04/12/22 1008       Core Components/Risk Factors/Patient Goals Review   Personal Goals Review Weight Management/Obesity;Heart Failure;Develop more efficient breathing techniques such as purse lipped breathing and diaphragmatic breathing and practicing self-pacing with activity.;Hypertension;Lipids;Improve shortness of breath with ADL's    Review Robin Ortiz reports that has now been weight stable. She reports thats she has been short of breath, but will pause to catch her breath. She reports continuing to use PLB to help control shortness of breath during exercise and reports using it frequently. She has a pulse oximeter at home as well. She checks her BP daily -  122/62 this morning at rehb, she reports that is will rang from 875/643P for systolic BP, but her HR is on the lower end. She reports not seeing her cardiologist until the end of August and has an echocardiogram at  the beginning of August. She continues to take her medications as  prescribed, she reports no issues aside from chronic nausea which she has reported to her doctors. Encouraged her to talk with her doctor about getting tired more easily and feeling weak.    Expected Outcomes Short: contiue to monitor BP and weight at home, soeak with doctor. Long: continue healthy behaviors to control cardiac risk factors.             ITP Comments:  ITP Comments     Row Name 02/01/22 0907 02/12/22 1507 02/13/22 0952 02/14/22 0842 02/22/22 1149   ITP Comments Virtual Visit completed. Patient informed on EP and RD appointment and 6 Minute walk test. Patient also informed of patient health questionnaires on My Chart. Patient Verbalizes understanding. Visit [facility]. Completed 6MWT and gym orientation. Initial ITP created and sent for review to Dr. Zetta Bills, Medical Director. First full day of exercise!  Patient was oriented to gym and equipment including functions, settings, policies, and procedures.  Patient's individual exercise prescription and treatment plan were reviewed.  All starting workloads were established based on the results of the 6 minute walk test done at initial orientation visit.  The plan for exercise progression was also introduced and progression will be customized based on patient's performance and goals. 30 Day review completed. Medical Director ITP review done, changes made as directed, and signed approval by Medical Director.  NEW Completed initial RD consultation    [place] Name 03/14/22 1253 04/11/22 0936 05/09/22 0735       ITP Comments 30 Day review completed. Medical Director ITP review done, changes made as directed, and signed approval by Medical Director. 30 Day review completed. Medical Director ITP review done, changes made as directed, and signed approval by Medical Director. 30 Day review completed. Medical Director ITP review done, changes made as directed, and signed approval by Medical Director.               Comments:

## 2022-05-10 ENCOUNTER — Encounter: Payer: Medicare Other | Admitting: *Deleted

## 2022-05-10 DIAGNOSIS — I5022 Chronic systolic (congestive) heart failure: Secondary | ICD-10-CM | POA: Diagnosis not present

## 2022-05-10 NOTE — Progress Notes (Signed)
Daily Session Note  Patient Details  Name: Dorreen C Martinique MRN: [id number] Date of Birth: 05-24-1935 Referring Provider:   Flowsheet Row Pulmonary Rehab from 02/12/2022 in [facility] Cardiac and Pulmonary Rehab  Referring Provider Gollan       Encounter Date: 05/10/2022  Check In:  Session Check In - 05/10/22 1228       Check-In   Supervising physician immediately available to respond to emergencies See telemetry face sheet for immediately available ER MD    Location [facility]-Cardiac & Pulmonary Rehab    Staff Present Nyoka Cowden, RN, BSN, Ardeth Sportsman, RDN, LDN;Jessica [place], MA, RCEP, CCRP, [place], MS, ASCM CEP, Exercise Physiologist    Virtual Visit No    Medication changes reported     No    Fall or balance concerns reported    No    Tobacco Cessation No Change    Warm-up and Cool-down Performed on first and last piece of equipment    Resistance Training Performed Yes    VAD Patient? No    PAD/SET Patient? No      Pain Assessment   Currently in Pain? No/denies                Social History   Tobacco Use  Smoking Status Never  Smokeless Tobacco Never    Goals Met:  Independence with exercise equipment Exercise tolerated well No report of concerns or symptoms today  Goals Unmet:  Not Applicable  Comments: Pt able to follow exercise prescription today without complaint.  Will continue to monitor for progression.    Dr. Emily Filbert is Medical Director for [place].  Dr. Ottie Glazier is Medical Director for [facility] Pulmonary Rehabilitation.

## 2022-05-14 ENCOUNTER — Other Ambulatory Visit: Payer: Self-pay | Admitting: Cardiovascular Disease

## 2022-05-14 ENCOUNTER — Ambulatory Visit: Payer: Medicare Other | Admitting: *Deleted

## 2022-05-14 VITALS — BP 123/76 | HR 52 | Resp 19 | Ht 65.0 in | Wt 138.0 lb

## 2022-05-14 DIAGNOSIS — R001 Bradycardia, unspecified: Secondary | ICD-10-CM

## 2022-05-14 NOTE — Patient Instructions (Signed)
Referral placed for EP provider.  Bradycardia noted on EKG today.

## 2022-05-14 NOTE — Progress Notes (Signed)
Dr. Mariah Milling reviewed EKG and requested referral to EP for junctional/sick sinus due to low heart rates. Order for referral placed and will have scheduling get her in to be seen.

## 2022-05-15 ENCOUNTER — Encounter: Payer: Medicare Other | Admitting: *Deleted

## 2022-05-15 DIAGNOSIS — I5022 Chronic systolic (congestive) heart failure: Secondary | ICD-10-CM

## 2022-05-15 NOTE — Progress Notes (Signed)
Daily Session Note  Patient Details  Name: Robin Ortiz MRN: [id number] Date of Birth: 1934/11/26 Referring Provider:   Flowsheet Row Pulmonary Rehab from 02/12/2022 in [facility] Cardiac and Pulmonary Rehab  Referring Provider Gollan       Encounter Date: 05/15/2022  Check In:  Session Check In - 05/15/22 0951       Check-In   Supervising physician immediately available to respond to emergencies See telemetry face sheet for immediately available ER MD    Location [facility]-Cardiac & Pulmonary Rehab    Staff Present Heath Lark, RN, BSN, CCRP;Jessica [place], MA, RCEP, CCRP, [place], BS, ACSM CEP, Exercise Physiologist    Virtual Visit No    Medication changes reported     No    Fall or balance concerns reported    No    Warm-up and Cool-down Performed on first and last piece of equipment    Resistance Training Performed Yes    VAD Patient? No    PAD/SET Patient? No      Pain Assessment   Currently in Pain? No/denies                Social History   Tobacco Use  Smoking Status Never  Smokeless Tobacco Never    Goals Met:  Proper associated with RPD/PD & O2 Sat Independence with exercise equipment Exercise tolerated well No report of concerns or symptoms today  Goals Unmet:  Not Applicable  Comments: Pt able to follow exercise prescription today without complaint.  Will continue to monitor for progression.    Dr. Emily Filbert is Medical Director for [place].  Dr. Ottie Glazier is Medical Director for [facility] Pulmonary Rehabilitation.

## 2022-05-17 ENCOUNTER — Encounter: Payer: Medicare Other | Admitting: *Deleted

## 2022-05-17 DIAGNOSIS — I5022 Chronic systolic (congestive) heart failure: Secondary | ICD-10-CM

## 2022-05-17 NOTE — Progress Notes (Signed)
Daily Session Note  Patient Details  Name: Robin Ortiz MRN: [id number] Date of Birth: 03/18/1935 Referring Provider:   Flowsheet Row Pulmonary Rehab from 02/12/2022 in [facility] Cardiac and Pulmonary Rehab  Referring Provider Gollan       Encounter Date: 05/17/2022  Check In:  Session Check In - 05/17/22 1004       Check-In   Supervising physician immediately available to respond to emergencies See telemetry face sheet for immediately available ER MD    Location [facility]-Cardiac & Pulmonary Rehab    Staff Present Heath Lark, RN, BSN, Jacklynn Bue, MS, ASCM CEP, Exercise Physiologist;Melissa [place], RDN, Melody Haver, BS, Exercise Physiologist    Virtual Visit No    Medication changes reported     No    Fall or balance concerns reported    No    Warm-up and Cool-down Performed on first and last piece of equipment    Resistance Training Performed Yes    VAD Patient? No    PAD/SET Patient? No      Pain Assessment   Currently in Pain? No/denies                Social History   Tobacco Use  Smoking Status Never  Smokeless Tobacco Never    Goals Met:  Proper associated with RPD/PD & O2 Sat Independence with exercise equipment Exercise tolerated well No report of concerns or symptoms today  Goals Unmet:  Not Applicable  Comments: Pt able to follow exercise prescription today without complaint.  Will continue to monitor for progression.    Dr. Emily Filbert is Medical Director for [place].  Dr. Ottie Glazier is Medical Director for [facility] Pulmonary Rehabilitation.

## 2022-05-21 NOTE — Progress Notes (Unsigned)
Cardiology Office Note  Date:  05/22/2022   ID:  Robin Ortiz, DOB [birth year], MRN [id number]  PCP:  Jaclyn Shaggy, MD   Chief Complaint  Patient presents with   1 month follow up     "Doing much better off the amiodarone/amlodipine; doesn't feel nausea anymore. Medications reviewed by the patient verbally.     HPI:  Robin Ortiz is a pleasant 87 year-old woman with a history of  hypertension,  hyperlipidemia,  remote history of nonischemic cardiomyopathy with initial ejection fraction 20-30% in 2002 that has improved to 50-55% on echocardiogram in March 2009,  status post bilateral knee replacement  Anxiety/gets stressed, chronic fatigue  CHA2DS2-VASc of 5. Ejection fraction 35 to 40% in atrial fibrillation Jan 2023 Cardioversion December 14, 2021 for fib flutter, on amiodarone ejection fraction up to 45 to 50% in 04/18/2022 Of beta-blockers in the setting of junctional bradycardia who presents for routine follow up of her cardiomyopathy, atrial fib/flutter  Last seen in clinic by myself July 2023 On that visit, documented Junctional bradycardia rate 39 bpm Carvedilol and amiodarone held Follow-up EKG showed sinus bradycardia with junctional escape beats  On today's visit she reports that she is feeling well Nausea better off amiodarone BP running high at home, systolic 160s at home Doing cardiac rehab  Lasix 20 three times a week, no edema, no ABD swelling  No regular exercise program apart from cardiac rehab Reports taking her Sherryll Burger, expensive No other blood pressure medications  EKG personally reviewed by myself on todays visit Sinus bradycardia rate 52 bpm, PAC, junctional escape beat  Other past medical history reviewed Stress at home, husband with dementia  Echocardiogram April 18, 2022  Ejection fraction up to 45 to 50%, previously 35 to 40%  Seen by one of our providers January 11, 2022 Bradycardia noted,carvedilol dosing decreased down to 6.25 twice daily,  amlodipine held  x-ray January 11, 2022, resolution of pleural effusions  Prior history of GERD, arthritic pain left leg, neuropathy  Seen by one of our providers October 17, 2021, in atrial fibrillation  Prior echocardiogram history EF previous as low as 20 to 30% in 2002  45 to 50% in December 2016.  Echo 10/18/2021 ef 35 to 40% Moderate mitral valve  regurgitation.  Left atrial size was severely dilated.  Tricuspid valve regurgitation is mild to moderate.  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  PSH:    Past Surgical History:  Procedure Laterality Date   CARDIOVERSION N/A 12/14/2021   Procedure: CARDIOVERSION;  Surgeon: Antonieta Iba, MD;  Location: [facility] ORS;  Service: Cardiovascular;  Laterality: N/A;   ROTATOR CUFF REPAIR     left shoulder   ruptured tendon right foot Right    surgical repair   TOTAL KNEE ARTHROPLASTY Bilateral     Current Outpatient Medications  Medication Sig Dispense Refill   apixaban (ELIQUIS) 5 MG TABS tablet Take 1 tablet (5 mg total) by mouth 2 (two) times daily. 60 tablet 6   Calcium-Vitamin D 600-200 MG-UNIT per tablet Take 1 tablet by mouth daily.     celecoxib (CELEBREX) 200 MG capsule Take 200 mg by mouth daily as needed for mild pain.     diclofenac Sodium (VOLTAREN) 1 % GEL Apply 2 g topically daily as needed (Arthritis).     furosemide (LASIX)  40 MG tablet Take 0.5 tablets (20 mg total) by mouth as directed. Take half a tablet once daily and if weight gain of 3 pounds in 24 hours or 5 pounds in one week you may take additional 20 mg. 30 tablet 3   Misc Natural Products (OSTEO BI-FLEX ADV JOINT SHIELD PO) Take 1 tablet by mouth daily.     Multiple Vitamin (MULTIVITAMIN) tablet Take 1 tablet by  mouth daily. Alive     potassium chloride (KLOR-CON) 10 MEQ tablet Take 1 tablet (10 mEq total) by mouth every Monday, Wednesday, and Friday. 39 tablet 3   sacubitril-valsartan (ENTRESTO) 97-103 MG Take 1 tablet by mouth 2 (two) times daily. 60 tablet 3   simvastatin (ZOCOR) 20 MG tablet Take 20 mg by mouth at bedtime.     diphenhydrAMINE-APAP, sleep, (TYLENOL PM EXTRA STRENGTH PO) Take 1 tablet by mouth at bedtime as needed (sleep). (Patient not taking: Reported on 04/30/2022)     raloxifene (EVISTA) 60 MG tablet Take 60 mg by mouth daily. (Patient not taking: Reported on 05/14/2022)     No current facility-administered medications for this visit.    Allergies:   Patient has no known allergies.   Social History:  The patient  reports that she has never smoked. She has never used smokeless tobacco. She reports current alcohol use of about 2.0 - 3.0 standard drinks of alcohol per week. She reports that she does not use drugs.   Family History:   family history includes Breast cancer in her cousin; Breast cancer (age of onset: 59) in her maternal aunt; Breast cancer (age of onset: 7) in her maternal grandmother; Heart disease in her father.   Review of Systems: Review of Systems  Constitutional: Negative.   HENT: Negative.    Respiratory: Negative.    Cardiovascular: Negative.   Gastrointestinal: Negative.   Musculoskeletal: Negative.   Neurological: Negative.   All other systems reviewed and are negative.  PHYSICAL EXAM: VS:  BP (!) 162/60 (BP Location: Left Arm, Patient Position: Sitting, Cuff Size: Normal)   Pulse (!) 52   Ht 5\' 5"  (1.651 m)   Wt 139 lb (63 kg)   SpO2 98%   BMI 23.13 kg/m  , BMI Body mass index is 23.13 kg/m.  Constitutional:  oriented to person, place, and time. No distress.  HENT:  Head: Grossly normal Eyes:  no discharge. No scleral icterus.  Neck: No JVD, no carotid bruits  Cardiovascular: Regular rate and rhythm, no murmurs appreciated Pulmonary/Chest:  Clear to auscultation bilaterally, no wheezes or rails Abdominal: Soft.  no distension.  no tenderness.  Musculoskeletal: Normal range of motion Neurological:  normal muscle tone. Coordination normal. No atrophy Skin: Skin warm and dry Psychiatric: normal affect, pleasant  Recent Labs: 10/17/2021: Magnesium 2.2; TSH 1.850 12/12/2021: Hemoglobin 11.8; Platelets 242 12/19/2021: ALT 22 01/11/2022: BUN 27; Creatinine, Ser 1.13; Potassium 4.2; Sodium 142    Lipid Panel No results found for: "CHOL", "HDL", "LDLCALC", "TRIG"    Wt Readings from Last 3 Encounters:  05/22/22 139 lb (63 kg)  05/14/22 138 lb (62.6 kg)  04/30/22 140 lb 2 oz (63.6 kg)     ASSESSMENT AND PLAN:  Persistent atrial fibrillation Prior successful cardioversion, maintaining normal sinus rhythm All beta-blocker and amiodarone held for nausea, junctional bradycardia We previously placed a referral to EP, junctional rhythm now essentially resolved and symptoms improved.  We will cancel appointment with EP given it is 2 months out High risk of recurrent arrhythmia, will  need to discuss with EP for recurrent episodes as beta-blockers and amiodarone held in the setting of junctional bradycardia  Chronic diastolic and systolic CHF continue Lasix 20 mg 3 days a week Appears euvolemic  Mixed hyperlipidemia Continue statin  Essential hypertension Continue Entresto, previously on amlodipine this was held out of fear of side effects and given cardiomyopathy We will start spironolactone 25 mg daily, BMP in 3 weeks time Recommend she call us with blood pressure measurements in the next 2 to 3 weeks.  Blood pressure continues to run high may need to add long-acting nitrates or hydralazine  Chronic fatigue/anxiety Exacerbated by bradycardia  Anxiety Stable, managed by primary care  Cardiomyopathy Long history of cardiomyopathy dating back to 2002 Continue Entresto 97/103 mg twice daily Hold carvedilol for  bradycardia Start spironolactone 25 daily as above Appears euvolemic, asymptomatic   Total encounter time more than 30  minutes  Greater than 50% was spent in counseling and coordination of care with the patient   Orders Placed This Encounter  Procedures   EKG 12-Lead     Signed, Dossie Arbour, M.D., Ph.D. 05/22/2022  [facility] [place], [place] [phone]

## 2022-05-22 ENCOUNTER — Encounter: Payer: Medicare Other | Admitting: *Deleted

## 2022-05-22 ENCOUNTER — Encounter: Payer: Self-pay | Admitting: Cardiovascular Disease

## 2022-05-22 ENCOUNTER — Ambulatory Visit (INDEPENDENT_AMBULATORY_CARE_PROVIDER_SITE_OTHER): Payer: Medicare Other | Admitting: Cardiovascular Disease

## 2022-05-22 VITALS — BP 162/60 | HR 52 | Ht 65.0 in | Wt 139.0 lb

## 2022-05-22 DIAGNOSIS — I4819 Other persistent atrial fibrillation: Secondary | ICD-10-CM

## 2022-05-22 DIAGNOSIS — I428 Other cardiomyopathies: Secondary | ICD-10-CM

## 2022-05-22 DIAGNOSIS — R001 Bradycardia, unspecified: Secondary | ICD-10-CM

## 2022-05-22 DIAGNOSIS — E782 Mixed hyperlipidemia: Secondary | ICD-10-CM

## 2022-05-22 DIAGNOSIS — I10 Essential (primary) hypertension: Secondary | ICD-10-CM | POA: Diagnosis not present

## 2022-05-22 DIAGNOSIS — I5022 Chronic systolic (congestive) heart failure: Secondary | ICD-10-CM

## 2022-05-22 DIAGNOSIS — Z79899 Other long term (current) drug therapy: Secondary | ICD-10-CM

## 2022-05-22 MED ORDER — SPIRONOLACTONE 25 MG PO TABS: 25.0000 mg | ORAL_TABLET | Freq: Every day | ORAL | 3 refills | Status: DC

## 2022-05-22 NOTE — Progress Notes (Signed)
Daily Session Note  Patient Details  Name: Robin Ortiz MRN: [id number] Date of Birth: 03/09/1935 Referring Provider:   Flowsheet Row Pulmonary Rehab from 02/12/2022 in [facility] Cardiac and Pulmonary Rehab  Referring Provider Gollan       Encounter Date: 05/22/2022  Check In:  Session Check In - 05/22/22 1037       Check-In   Supervising physician immediately available to respond to emergencies See telemetry face sheet for immediately available ER MD    Location [facility]-Cardiac & Pulmonary Rehab    Staff Present Heath Lark, RN, BSN, CCRP;Jessica H[place], MA, RCEP, CCRP, [place], BS, ACSM CEP, Exercise Physiologist    Virtual Visit No    Medication changes reported     No    Fall or balance concerns reported    No    Warm-up and Cool-down Performed on first and last piece of equipment    Resistance Training Performed Yes    VAD Patient? No    PAD/SET Patient? No      Pain Assessment   Currently in Pain? No/denies                Social History   Tobacco Use  Smoking Status Never  Smokeless Tobacco Never    Goals Met:  Proper associated with RPD/PD & O2 Sat Independence with exercise equipment Exercise tolerated well No report of concerns or symptoms today  Goals Unmet:  Not Applicable  Comments: Pt able to follow exercise prescription today without complaint.  Will continue to monitor for progression.    Dr. Emily Filbert is Medical Director for [place].  Dr. Ottie Glazier is Medical Director for [facility] Pulmonary Rehabilitation.

## 2022-05-22 NOTE — Patient Instructions (Addendum)
We will cancel appt with Dr. Caryl Comes Heart rate is better  Medication Instructions:  - Your physician has recommended you make the following change in your medication:   1) START spironolactone 25 mg: - take 1 tablet by mouth once daily  2) STOP potassium   Monitor and call/ send a MyChart message with blood pressure numbers  If you need a refill on your cardiac medications before your next appointment, please call your pharmacy.    Lab work: - Your physician recommends that you return for lab work in: 3 weeks  BMP  Nature conservation officer at [facility] 1st desk on the right to check in (REGISTRATION)  Lab hours: Monday- Friday (7:30 am- 5:30 pm)    Testing/Procedures: No new testing needed   Follow-Up: At [facility], you and your health needs are our priority.  As part of our continuing mission to provide you with exceptional heart care, we have created designated Provider Care Teams.  These Care Teams include your primary Cardiologist (physician) and Advanced Practice Providers (APPs -  Physician Assistants and Nurse Practitioners) who all work together to provide you with the care you need, when you need it.  You will need a follow up appointment in 3 months  Providers on your designated Care Team:   Murray Hodgkins, NP Christell Faith, PA-C Cadence Kathlen Mody, [place]  COVID-19 Vaccine Information can be found at: [email or web address].uk For questions related to vaccine distribution or appointments, please email vaccine@[email or web address] .[email or web address] or call [phone].     Spironolactone Tablets What is this medication? SPIRONOLACTONE (speer on oh LAK tone) treats high blood pressure and heart failure. It may also be used to reduce swelling related to heart, kidney, or liver disease. It helps your kidneys remove more fluid and salt from your blood through the urine without losing too much potassium. It belongs to a group of medications called  diuretics. This medicine may be used for other purposes; ask your health care provider or pharmacist if you have questions. COMMON BRAND NAME(S): Aldactone What should I tell my care team before I take this medication? They need to know if you have any of these conditions: Addison's disease or low adrenal gland function High blood level of potassium Kidney disease Liver disease An unusual or allergic reaction to spironolactone, other medications, foods, dyes, or preservatives Pregnant or trying to get pregnant Breast-feeding How should I use this medication? Take this medication by mouth. Take it as directed on the prescription label at the same time every day. You can take it with or without food. You should always take it the same way. Keep taking it unless your care team tells you to stop. Talk to your care team about the use of this medication in children. Special care may be needed. Overdosage: If you think you have taken too much of this medicine contact a poison control center or emergency room at once. NOTE: This medicine is only for you. Do not share this medicine with others. What if I miss a dose? If you miss a dose, take it as soon as you can. If it is almost time for your next dose, take only that dose. Do not take double or extra doses. What may interact with this medication? Do not take this medication with any of the following: Cidofovir Eplerenone Tranylcypromine This medication may also interact with the following: Aspirin Certain medications for blood pressure or heart disease like benazepril, lisinopril, losartan, valsartan Certain medications that treat or prevent blood clots like heparin and  enoxaparin Cholestyramine Cyclosporine Digoxin Lithium Medications that relax muscles for surgery NSAIDs, medications for pain and inflammation, like ibuprofen or naproxen Other diuretics Potassium salts or supplements Steroid medications like prednisone or  cortisone Trimethoprim This list may not describe all possible interactions. Give your health care provider a list of all the medicines, herbs, non-prescription drugs, or dietary supplements you use. Also tell them if you smoke, drink alcohol, or use illegal drugs. Some items may interact with your medicine. What should I watch for while using this medication? Visit your care team for regular checks on your progress. Check your blood pressure as directed. Ask your care team what your blood pressure should be. Also, find out when you should contact him or her. Do not treat yourself for coughs, colds, or pain while you are using this medication without asking your care team for advice. Some medications may increase your blood pressure. Check with your care team if you have severe diarrhea, nausea, and vomiting, or if you sweat a lot. The loss of too much body fluid may make it dangerous for you to take this medication. You may need to be on a special diet while taking this medication. Ask your care team. Also, find out how many glasses of fluid you need to drink each day. You may get drowsy or dizzy. Do not drive, use machinery, or do anything that needs mental alertness until you know how this medication affects you. Do not stand or sit up quickly, especially if you are an older patient. This reduces the risk of dizzy or fainting spells. Alcohol may interfere with the effects of this medication. Avoid alcoholic drinks. Avoid salt substitutes unless you are told otherwise by your care team. What side effects may I notice from receiving this medication? Side effects that you should report to your care team as soon as possible: Allergic reactions--skin rash, itching, hives, swelling of the face, lips, tongue, or throat Dehydration--increased thirst, dry mouth, feeling faint or lightheaded, headache, dark yellow or brown urine High potassium level--muscle weakness, fast or irregular heartbeat Kidney  injury--decrease in the amount of urine, swelling of the ankles, hands, or feet Low blood pressure--dizziness, feeling faint or lightheaded, blurry vision Low sodium level--muscle weakness, fatigue, dizziness, headache, confusion Side effects that usually do not require medical attention (report to your care team if they continue or are bothersome): Breast pain or tenderness Changes in sex drive or performance Dizziness Headache Irregular menstrual cycles or spotting Unexpected breast tissue growth This list may not describe all possible side effects. Call your doctor for medical advice about side effects. You may report side effects to FDA at 1-800-FDA-1088. Where should I keep my medication? Keep out of the reach of children and pets. Store below 25 degrees C (77 degrees F). Get rid of any unused medication after the expiration date. To get rid of medications that are no longer needed or have expired: Take the medication to a medication take-back program. Check with your pharmacy or law enforcement to find a location. If you cannot return the medication, check the label or package insert to see if the medication should be thrown out in the garbage or flushed down the toilet. If you are not sure, ask your care team. If it is safe to put into the trash, take the medication out of the container. Mix the medication with cat litter, dirt, coffee grounds, or other unwanted substance. Seal the mixture in a bag or container. Put it in the trash. NOTE:  This sheet is a summary. It may not cover all possible information. If you have questions about this medicine, talk to your doctor, pharmacist, or health care provider.  2023 Elsevier/Gold Standard (2007-11-08 00:00:00)

## 2022-05-23 ENCOUNTER — Other Ambulatory Visit: Payer: Self-pay | Admitting: Internal Medicine

## 2022-05-23 DIAGNOSIS — Z1231 Encounter for screening mammogram for malignant neoplasm of breast: Secondary | ICD-10-CM

## 2022-05-24 ENCOUNTER — Encounter: Payer: Medicare Other | Admitting: *Deleted

## 2022-05-24 DIAGNOSIS — I5022 Chronic systolic (congestive) heart failure: Secondary | ICD-10-CM | POA: Diagnosis not present

## 2022-05-24 NOTE — Progress Notes (Signed)
Daily Session Note  Patient Details  Name: Robin Ortiz MRN: [id number] Date of Birth: [birth year] Referring Provider:   Flowsheet Row Pulmonary Rehab from 02/12/2022 in [facility] Cardiac and Pulmonary Rehab  Referring Provider Gollan       Encounter Date: 05/24/2022  Check In:  Session Check In - 05/24/22 0957       Check-In   Supervising physician immediately available to respond to emergencies See telemetry face sheet for immediately available ER MD    Location [facility]-Cardiac & Pulmonary Rehab    Staff Present Heath Lark, RN, BSN, CCRP;Melissa Flandreau, RDN, LDN;Joseph [place], RCP,RRT,BSRT;Noah Tickle, BS, Exercise Physiologist    Virtual Visit No    Medication changes reported     No    Fall or balance concerns reported    No    Warm-up and Cool-down Performed on first and last piece of equipment    Resistance Training Performed Yes    VAD Patient? No    PAD/SET Patient? No      Pain Assessment   Currently in Pain? No/denies                Social History   Tobacco Use  Smoking Status Never  Smokeless Tobacco Never    Goals Met:  Proper associated with RPD/PD & O2 Sat Independence with exercise equipment Exercise tolerated well No report of concerns or symptoms today  Goals Unmet:  Not Applicable  Comments: Pt able to follow exercise prescription today without complaint.  Will continue to monitor for progression.    Dr. Emily Filbert is Medical Director for [facility].  Dr. Ottie Glazier is Medical Director for [facility] Pulmonary Rehabilitation.

## 2022-05-29 ENCOUNTER — Encounter: Payer: Medicare Other | Admitting: *Deleted

## 2022-05-29 ENCOUNTER — Other Ambulatory Visit: Payer: Self-pay | Admitting: Nurse Practitioner

## 2022-05-29 DIAGNOSIS — I5022 Chronic systolic (congestive) heart failure: Secondary | ICD-10-CM

## 2022-05-29 NOTE — Telephone Encounter (Signed)
Prescription refill request for Eliquis received. Indication: AF Last office visit: 05/22/22  Concha Se MD Scr: 1.13 on 01/11/22 Age:  87 Weight: 63kg  Based on above findings Eliquis 5mg  twice daily is the appropriate dose.  Refill approved.

## 2022-05-29 NOTE — Progress Notes (Signed)
Daily Session Note  Patient Details  Name: Robin Ortiz MRN: [id number] Date of Birth: Sep 14, 1935 Referring Provider:   Flowsheet Row Pulmonary Rehab from 02/12/2022 in [facility] Cardiac and Pulmonary Rehab  Referring Provider Gollan       Encounter Date: 05/29/2022  Check In:  Session Check In - 05/29/22 1000       Check-In   Supervising physician immediately available to respond to emergencies See telemetry face sheet for immediately available ER MD    Location [facility]-Cardiac & Pulmonary Rehab    Staff Present Nyoka Cowden, RN, BSN, [place], BS, ACSM CEP, Exercise Physiologist;Jessica [place], MA, RCEP, CCRP, CCET    Virtual Visit No    Medication changes reported     Yes    Comments Pt states that she has started on Spironalactone (is unsure if that is the correct name), Uncertain of dosage and if she takes it once or twice a day. Asked to bring correct info to next class.    Fall or balance concerns reported    No    Tobacco Cessation No Change    Warm-up and Cool-down Performed on first and last piece of equipment    Resistance Training Performed Yes    VAD Patient? No    PAD/SET Patient? No      Pain Assessment   Currently in Pain? No/denies                Social History   Tobacco Use  Smoking Status Never  Smokeless Tobacco Never    Goals Met:  Independence with exercise equipment Exercise tolerated well No report of concerns or symptoms today  Goals Unmet:  Not Applicable  Comments: Pt able to follow exercise prescription today without complaint.  Will continue to monitor for progression.    Dr. Emily Filbert is Medical Director for [place].  Dr. Ottie Glazier is Medical Director for [facility] Pulmonary Rehabilitation.

## 2022-05-30 ENCOUNTER — Ambulatory Visit: Payer: Medicare Other | Admitting: Cardiovascular Disease

## 2022-05-31 ENCOUNTER — Encounter: Payer: Medicare Other | Admitting: *Deleted

## 2022-05-31 DIAGNOSIS — I5022 Chronic systolic (congestive) heart failure: Secondary | ICD-10-CM | POA: Diagnosis not present

## 2022-05-31 NOTE — Progress Notes (Signed)
Daily Session Note  Patient Details  Name: Robin Ortiz MRN: [id number] Date of Birth: [birth year] Referring Provider:   Flowsheet Row Pulmonary Rehab from 02/12/2022 in [facility] Cardiac and Pulmonary Rehab  Referring Provider Gollan       Encounter Date: 05/31/2022  Check In:  Session Check In - 05/31/22 1245       Check-In   Supervising physician immediately available to respond to emergencies See telemetry face sheet for immediately available ER MD    Location [facility]-Cardiac & Pulmonary Rehab    Staff Present Nyoka Cowden, RN, BSN, Lauretta Grill, RCP,RRT,BSRT;Melissa [place], [place], LDN    Virtual Visit No    Medication changes reported     Yes    Comments Pt updates information from last session-she has started Spironolactone 96m QD.    Fall or balance concerns reported    No    Tobacco Cessation No Change    Warm-up and Cool-down Performed on first and last piece of equipment    Resistance Training Performed Yes    VAD Patient? No    PAD/SET Patient? No      Pain Assessment   Currently in Pain? No/denies                Social History   Tobacco Use  Smoking Status Never  Smokeless Tobacco Never    Goals Met:  Independence with exercise equipment Exercise tolerated well No report of concerns or symptoms today  Goals Unmet:  Not Applicable  Comments: Pt able to follow exercise prescription today without complaint.  Will continue to monitor for progression.    Dr. [place]mily Filbertis Medical Director [place] [place]  Dr. FOttie Glazieris Medical Director [facility].

## 2022-06-05 ENCOUNTER — Encounter: Payer: Medicare Other | Attending: Cardiovascular Disease | Admitting: *Deleted

## 2022-06-05 DIAGNOSIS — I5022 Chronic systolic (congestive) heart failure: Secondary | ICD-10-CM | POA: Insufficient documentation

## 2022-06-05 NOTE — Progress Notes (Signed)
Daily Session Note  Patient Details  Name: Robin Ortiz MRN: [id number] Date of Birth: 01-13-1935 Referring Provider:   Flowsheet Row Pulmonary Rehab from 02/12/2022 in [facility] Cardiac and Pulmonary Rehab  Referring Provider Gollan       Encounter Date: 06/05/2022  Check In:  Session Check In - 06/05/22 1003       Check-In   Supervising physician immediately available to respond to emergencies See telemetry face sheet for immediately available ER MD    Location [facility]-Cardiac & Pulmonary Rehab    Staff Present Nyoka Cowden, RN, BSN, Fenton Foy, BS, Exercise Physiologist;Jessica Salvo, MA, RCEP, CCRP, CCET    Virtual Visit No    Medication changes reported     No    Fall or balance concerns reported    No    Tobacco Cessation No Change    Warm-up and Cool-down Performed on first and last piece of equipment    Resistance Training Performed Yes    VAD Patient? No    PAD/SET Patient? No      Pain Assessment   Currently in Pain? No/denies                Social History   Tobacco Use  Smoking Status Never  Smokeless Tobacco Never    Goals Met:  Independence with exercise equipment Exercise tolerated well No report of concerns or symptoms today  Goals Unmet:  Not Applicable  Comments: Pt able to follow exercise prescription today without complaint.  Will continue to monitor for progression.    Dr. Emily Filbert is Medical Director for [facility].  Dr. Ottie Glazier is Medical Director for [facility] Pulmonary Rehabilitation.

## 2022-06-06 ENCOUNTER — Encounter: Payer: Self-pay | Admitting: *Deleted

## 2022-06-06 DIAGNOSIS — I5022 Chronic systolic (congestive) heart failure: Secondary | ICD-10-CM

## 2022-06-06 NOTE — Progress Notes (Signed)
Pulmonary Individual Treatment Plan  Patient Details  Name: Nera C Martinique MRN: [id number] Date of Birth: 05/16/1935 Referring Provider:   Flowsheet Row Pulmonary Rehab from 02/12/2022 in [facility] Cardiac and Pulmonary Rehab  Referring Provider Gollan       Initial Encounter Date:  Flowsheet Row Pulmonary Rehab from 02/12/2022 in [facility] Cardiac and Pulmonary Rehab  Date 02/12/22       Visit Diagnosis: Heart failure, chronic systolic ([place])  Patient's Home Medications on Admission:  Current Outpatient Medications:    apixaban (ELIQUIS) 5 MG TABS tablet, Take 1 tablet (5 mg total) by mouth 2 (two) times daily., Disp: 60 tablet, Rfl: 5   Calcium-Vitamin D 600-200 MG-UNIT per tablet, Take 1 tablet by mouth daily., Disp: , Rfl:    celecoxib (CELEBREX) 200 MG capsule, Take 200 mg by mouth daily as needed for mild pain., Disp: , Rfl:    diclofenac Sodium (VOLTAREN) 1 % GEL, Apply 2 g topically daily as needed (Arthritis)., Disp: , Rfl:    diphenhydrAMINE-APAP, sleep, (TYLENOL PM EXTRA STRENGTH PO), Take 1 tablet by mouth at bedtime as needed (sleep). (Patient not taking: Reported on 04/30/2022), Disp: , Rfl:    furosemide (LASIX) 40 MG tablet, Take 0.5 tablets (20 mg total) by mouth as directed. Take half a tablet once daily and if weight gain of 3 pounds in 24 hours or 5 pounds in one week you may take additional 20 mg., Disp: 30 tablet, Rfl: 3   Misc Natural Products (OSTEO BI-FLEX ADV JOINT SHIELD PO), Take 1 tablet by mouth daily., Disp: , Rfl:    Multiple Vitamin (MULTIVITAMIN) tablet, Take 1 tablet by mouth daily. Alive, Disp: , Rfl:    raloxifene (EVISTA) 60 MG tablet, Take 60 mg by mouth daily. (Patient not taking: Reported on 05/14/2022), Disp: , Rfl:    sacubitril-valsartan (ENTRESTO) 97-103 MG, Take 1 tablet by mouth 2 (two) times daily., Disp: 60 tablet, Rfl: 3   simvastatin (ZOCOR) 20 MG tablet, Take 20 mg by mouth at bedtime., Disp: , Rfl:    spironolactone (ALDACTONE) 25 MG tablet,  Take 1 tablet (25 mg total) by mouth daily., Disp: 90 tablet, Rfl: 3  Past Medical History: Past Medical History:  Diagnosis Date   Anxiety    Cardiomegaly    Chronic fatigue    Chronic HFmrEF (heart failure with midrange ejection fraction) ([place])    a. 2002 Echo: EF 20-30%; b. 09/2015 Echo: EF 45-50%, diff HK. Mild MR. Mod dil LA; c. 10/2021 Echo: EF 35-40% (in setting of afib), no rwma, low-nl RV fxn, sev dil LA, mildly dil RA, mod MR, mild-mod TR.   Essential hypertension    Mixed hyperlipidemia    NICM (nonischemic cardiomyopathy) (HCC)    Osteoporosis    Persistent atrial fibrillation ([place])    a. Dx 10/2021. CHA2DS2VASc = 5-->Eliquis; b. 11/2021 s/p DCCV (120J).    Tobacco Use: Social History   Tobacco Use  Smoking Status Never  Smokeless Tobacco Never    Labs: Review Flowsheet        No data to display           Pulmonary Assessment Scores:  Pulmonary Assessment Scores     Row Name 02/12/22 1526         ADL UCSD   ADL Phase Entry     SOB Score total 53     Rest 3     Walk 2     Stairs 3     Bath 2  Dress 2     Shop 2       CAT Score   CAT Score 14       mMRC Score   mMRC Score 2              UCSD: Self-administered rating of dyspnea associated with activities of daily living (ADLs) 6-point scale (0 = "not at all" to 5 = "maximal or unable to do because of breathlessness")  Scoring Scores range from 0 to 120.  Minimally important difference is 5 units  CAT: CAT can identify the health impairment of COPD patients and is better correlated with disease progression.  CAT has a scoring range of zero to 40. The CAT score is classified into four groups of low (less than 10), medium (10 - 20), high (21-30) and very high (31-40) based on the impact level of disease on health status. A CAT score over 10 suggests significant symptoms.  A worsening CAT score could be explained by an exacerbation, poor medication adherence, poor inhaler technique, or  progression of COPD or comorbid conditions.  CAT MCID is 2 points  mMRC: mMRC (Modified Medical Research Council) Dyspnea Scale is used to assess the degree of baseline functional disability in patients of respiratory disease due to dyspnea. No minimal important difference is established. A decrease in score of 1 point or greater is considered a positive change.   Pulmonary Function Assessment:  Pulmonary Function Assessment - 02/01/22 0909       Breath   Shortness of Breath No             Exercise Target Goals: Exercise Program Goal: Individual exercise prescription set using results from initial 6 min walk test and THRR while considering  patient's activity barriers and safety.   Exercise Prescription Goal: Initial exercise prescription builds to 30-45 minutes a day of aerobic activity, 2-3 days per week.  Home exercise guidelines will be given to patient during program as part of exercise prescription that the participant will acknowledge.  Education: Aerobic Exercise: - Group verbal and visual presentation on the components of exercise prescription. Introduces F.I.T.T principle from ACSM for exercise prescriptions.  Reviews F.I.T.T. principles of aerobic exercise including progression. Written material given at graduation. Flowsheet Row Pulmonary Rehab from 05/10/2022 in [facility] Cardiac and Pulmonary Rehab  Education need identified 02/12/22       Education: Resistance Exercise: - Group verbal and visual presentation on the components of exercise prescription. Introduces F.I.T.T principle from ACSM for exercise prescriptions  Reviews F.I.T.T. principles of resistance exercise including progression. Written material given at graduation.    Education: Exercise & Equipment Safety: - Individual verbal instruction and demonstration of equipment use and safety with use of the equipment. Flowsheet Row Pulmonary Rehab from 05/10/2022 in [facility] Cardiac and Pulmonary Rehab  Date 02/12/22   Educator [facility]  Instruction Review Code 1- Verbalizes Understanding       Education: Exercise Physiology & General Exercise Guidelines: - Group verbal and written instruction with models to review the exercise physiology of the cardiovascular system and associated critical values. Provides general exercise guidelines with specific guidelines to those with heart or lung disease.    Education: Flexibility, Balance, Mind/Body Relaxation: - Group verbal and visual presentation with interactive activity on the components of exercise prescription. Introduces F.I.T.T principle from ACSM for exercise prescriptions. Reviews F.I.T.T. principles of flexibility and balance exercise training including progression. Also discusses the mind body connection.  Reviews various relaxation techniques to help reduce and manage  stress (i.e. Deep breathing, progressive muscle relaxation, and visualization). Balance handout provided to take home. Written material given at graduation. Flowsheet Row Pulmonary Rehab from 05/10/2022 in [facility] Cardiac and Pulmonary Rehab  Date 02/15/22  Educator [facility]  Instruction Review Code 1- Verbalizes Understanding       Activity Barriers & Risk Stratification:   6 Minute Walk:  6 Minute Walk     Row Name 02/12/22 1512         6 Minute Walk   Phase Initial     Distance 860 feet     Walk Time 6 minutes     # of Rest Breaks 0     MPH 1.63     METS 1.48     RPE 13     Perceived Dyspnea  2     VO2 Peak 5.17     Symptoms Yes (comment)     Comments left hip pain when walking 3/10     Resting HR 63 bpm     Resting BP 140/80     Resting Oxygen Saturation  98 %     Exercise Oxygen Saturation  during 6 min walk 95 %     Max Ex. HR 75 bpm     Max Ex. BP 166/88     2 Minute Post BP 158/84       Interval HR   1 Minute HR 70     2 Minute HR 75     3 Minute HR 75     4 Minute HR 75     5 Minute HR 75     6 Minute HR 76     2 Minute Post HR 59     Interval Heart Rate? Yes        Interval Oxygen   Interval Oxygen? Yes     Baseline Oxygen Saturation % 98 %     1 Minute Oxygen Saturation % 96 %     1 Minute Liters of Oxygen 0 L     2 Minute Oxygen Saturation % 95 %     2 Minute Liters of Oxygen 0 L     3 Minute Oxygen Saturation % 96 %     3 Minute Liters of Oxygen 0 L     4 Minute Oxygen Saturation % 97 %     4 Minute Liters of Oxygen 0 L     5 Minute Oxygen Saturation % 96 %     5 Minute Liters of Oxygen 0 L     6 Minute Oxygen Saturation % 96 %     6 Minute Liters of Oxygen 0 L     2 Minute Post Oxygen Saturation % 98 %     2 Minute Post Liters of Oxygen 0 L             Oxygen Initial Assessment:  Oxygen Initial Assessment - 02/12/22 1649       Home Oxygen   Home Oxygen Device None    Sleep Oxygen Prescription None    Home Exercise Oxygen Prescription None    Home Resting Oxygen Prescription None    Compliance with Home Oxygen Use Yes      Initial 6 min Walk   Oxygen Used None      Program Oxygen Prescription   Program Oxygen Prescription None      Intervention   Short Term Goals To learn and understand importance of monitoring SPO2 with pulse oximeter and demonstrate accurate  use of the pulse oximeter.;To learn and understand importance of maintaining oxygen saturations>88%;To learn and demonstrate proper pursed lip breathing techniques or other breathing techniques.     Long  Term Goals Maintenance of O2 saturations>88%;Exhibits proper breathing techniques, such as pursed lip breathing or other method taught during program session;Verbalizes importance of monitoring SPO2 with pulse oximeter and return demonstration             Oxygen Re-Evaluation:  Oxygen Re-Evaluation     Row Name 02/13/22 0953 02/27/22 1017 04/05/22 1017 04/12/22 1010 05/17/22 1001     Program Oxygen Prescription   Program Oxygen Prescription _0      Home Oxygen   Home Oxygen Device _1    Sleep Oxygen  Prescription _2    Home Exercise Oxygen Prescription _3    Home Resting Oxygen Prescription _4    Compliance with Home Oxygen Use _5      Goals/Expected Outcomes   Short Term Goals To learn and understand importance of monitoring SPO2 with pulse oximeter and demonstrate accurate use of the pulse oximeter.;To learn and understand importance of maintaining oxygen saturations>88%;To learn and demonstrate proper pursed lip breathing techniques or other breathing techniques.  To learn and understand importance of monitoring SPO2 with pulse oximeter and demonstrate accurate use of the pulse oximeter.;To learn and understand importance of maintaining oxygen saturations>88%;To learn and demonstrate proper pursed lip breathing techniques or other breathing techniques.  To learn and understand importance of monitoring SPO2 with pulse oximeter and demonstrate accurate use of the pulse oximeter.;To learn and understand importance of maintaining oxygen saturations>88%;To learn and demonstrate proper pursed lip breathing techniques or other breathing techniques.  To learn and understand importance of monitoring SPO2 with pulse oximeter and demonstrate accurate use of the pulse oximeter.;To learn and understand importance of maintaining oxygen saturations>88%;To learn and demonstrate proper pursed lip breathing techniques or other breathing techniques.  To learn and understand importance of monitoring SPO2 with pulse oximeter and demonstrate accurate use of the pulse oximeter.;To learn and understand importance of maintaining oxygen saturations>88%;To learn and demonstrate proper pursed lip breathing techniques or other breathing techniques.    Long  Term Goals Maintenance of O2 saturations>88%;Exhibits proper breathing techniques, such as pursed lip breathing or other method taught during program session;Verbalizes importance of monitoring SPO2  with pulse oximeter and return demonstration Maintenance of O2 saturations>88%;Exhibits proper breathing techniques, such as pursed lip breathing or other method taught during program session;Verbalizes importance of monitoring SPO2 with pulse oximeter and return demonstration Maintenance of O2 saturations>88%;Exhibits proper breathing techniques, such as pursed lip breathing or other method taught during program session;Verbalizes importance of monitoring SPO2 with pulse oximeter and return demonstration Maintenance of O2 saturations>88%;Exhibits proper breathing techniques, such as pursed lip breathing or other method taught during program session;Verbalizes importance of monitoring SPO2 with pulse oximeter and return demonstration Maintenance of O2 saturations>88%;Exhibits proper breathing techniques, such as pursed lip breathing or other method taught during program session;Verbalizes importance of monitoring SPO2 with pulse oximeter and return demonstration   Comments Reviewed PLB technique with pt.  Talked about how it works and it's importance in maintaining their exercise saturations. Patient reports using PLB to help control SOB during exercise. Patient was encouraged to monitor SaO2 during exercise while exercising at home. Patient reports continuing to use PLB to help control shortness of breath during exercise and reports using it frequently. Patient  was encouraged to monitor O2 during exercise while exercising at home. Patient reports continuing to use PLB to help control shortness of breath during exercise and reports using it frequently, she will take breaks as needed as well while doing her ADLs. She rpeorts having a pulse oximeter at home to use. Karn reports that her weight stable has been stable. She reports thats she has been short of breath, but will pause to catch her breath; this doesn't happen frequently. She reports continuing to use PLB to help control shortness of breath during  exercise and reports using it frequently. She has a pulse oximeter at home as well, she does not wear it; encouraged her to check her O2 when exercising.  When she is doing her activities of daily living sometimes she will have to pause or break due to fatigue.   Goals/Expected Outcomes Short: Become more profiecient at using PLB.   Long: Become independent at using PLB. Short: Continue to use PBL and start monitoring SaO2 at home during exercise. Long: Become independent with PLB and exercise program. Short: Continue to use PBL and start monitoring O2 at home during exercise. Long: Become independent with PLB and exercise program. Short: Continue to use PBL and start monitoring O2 at home during exercise. Long: Become independent with PLB and exercise program. Short: Continue to use PBL and start monitoring O2 at home during exercise. Long: Become independent with PLB and exercise program.            Oxygen Discharge (Final Oxygen Re-Evaluation):  Oxygen Re-Evaluation - 05/17/22 1001       Program Oxygen Prescription   Program Oxygen Prescription None      Home Oxygen   Home Oxygen Device None    Sleep Oxygen Prescription None    Home Exercise Oxygen Prescription None    Home Resting Oxygen Prescription None    Compliance with Home Oxygen Use Yes      Goals/Expected Outcomes   Short Term Goals To learn and understand importance of monitoring SPO2 with pulse oximeter and demonstrate accurate use of the pulse oximeter.;To learn and understand importance of maintaining oxygen saturations>88%;To learn and demonstrate proper pursed lip breathing techniques or other breathing techniques.     Long  Term Goals Maintenance of O2 saturations>88%;Exhibits proper breathing techniques, such as pursed lip breathing or other method taught during program session;Verbalizes importance of monitoring SPO2 with pulse oximeter and return demonstration    Comments Vaidehi reports that her weight stable has  been stable. She reports thats she has been short of breath, but will pause to catch her breath; this doesn't happen frequently. She reports continuing to use PLB to help control shortness of breath during exercise and reports using it frequently. She has a pulse oximeter at home as well, she does not wear it; encouraged her to check her O2 when exercising.  When she is doing her activities of daily living sometimes she will have to pause or break due to fatigue.    Goals/Expected Outcomes Short: Continue to use PBL and start monitoring O2 at home during exercise. Long: Become independent with PLB and exercise program.             Initial Exercise Prescription:  Initial Exercise Prescription - 02/12/22 1500       Date of Initial Exercise RX and Referring Provider   Date 02/12/22    Referring Provider Gollan      Oxygen   Maintain Oxygen Saturation 88% or higher  Treadmill   MPH 0.8    Grade 0    Minutes 15    METs 1.48      NuStep   Level 1    SPM 80    Minutes 15    METs 1.48      Biostep-RELP   Level 1    SPM 50    Minutes 15    METs 1.48      Prescription Details   Frequency (times per week) 2    Duration Progress to 30 minutes of continuous aerobic without signs/symptoms of physical distress      Intensity   THRR 40-80% of Max Heartrate 92-121    Ratings of Perceived Exertion 11-13    Perceived Dyspnea 0-4      Progression   Progression Continue to progress workloads to maintain intensity without signs/symptoms of physical distress.      Resistance Training   Training Prescription Yes    Weight 2    Reps 10-15             Perform Capillary Blood Glucose checks as needed.  Exercise Prescription Changes:   Exercise Prescription Changes     Row Name 02/12/22 1500 02/22/22 1200 02/27/22 1000 03/07/22 0800 03/19/22 1600     Response to Exercise   Blood Pressure (Admit) 140/80 124/62 -- 120/58 148/76   Blood Pressure (Exercise) 166/88 -- --  160/78 136/78   Blood Pressure (Exit) 120/60 132/68 -- 90/56 146/68   Heart Rate (Admit) 63 bpm 62 bpm -- 50 bpm 52 bpm   Heart Rate (Exercise) 76 bpm 65 bpm -- 83 bpm 69 bpm   Heart Rate (Exit) 49 bpm 60 bpm -- 60 bpm 52 bpm   Oxygen Saturation (Admit) 98 % 97 % -- 96 % 98 %   Oxygen Saturation (Exercise) 95 % 94 % -- 84 % 91 %   Oxygen Saturation (Exit) 96 % 97 % -- 98 % 97 %   Rating of Perceived Exertion (Exercise) 13 15 -- 13 13   Perceived Dyspnea (Exercise) 2 2 -- 1 0   Symptoms 3/10 hip pain with walking SOB -- none none   Comments 6 MWT results -- -- -- --   Duration -- Progress to 30 minutes of  aerobic without signs/symptoms of physical distress Progress to 30 minutes of  aerobic without signs/symptoms of physical distress Progress to 30 minutes of  aerobic without signs/symptoms of physical distress Continue with 30 min of aerobic exercise without signs/symptoms of physical distress.   Intensity -- THRR unchanged THRR unchanged THRR unchanged THRR unchanged     Progression   Progression -- Continue to progress workloads to maintain intensity without signs/symptoms of physical distress. Continue to progress workloads to maintain intensity without signs/symptoms of physical distress. Continue to progress workloads to maintain intensity without signs/symptoms of physical distress. Continue to progress workloads to maintain intensity without signs/symptoms of physical distress.   Average METs -- 1.86 1.86 1.81 1.9     Resistance Training   Training Prescription -- Yes Yes Yes Yes   Weight -- 2 lb 2 lb 2 lb 2 lb   Reps -- 10-15 10-15 10-15 10-15     Interval Training   Interval Training -- No No No No     Treadmill   MPH -- 0.8 0.8 1.4 0.9   Grade -- 0 0 0 0   Minutes -- _0 METs -- 1.6 1.6 2.07 1.69  NuStep   Level -- _0 Minutes -- _1 METs -- _2 2.4     Biostep-RELP   Level -- _3 --   Minutes -- _4 --   METs -- 2 2 1.2 --      Home Exercise Plan   Plans to continue exercise at -- -- Home (comment)  walking at home outside or on TM and continue to do PT exercises. Home (comment)  walking at home outside or on TM and continue to do PT exercises. --   Frequency -- -- Add 2 additional days to program exercise sessions. Add 2 additional days to program exercise sessions. --   Initial Home Exercises Provided -- -- 02/27/22 02/27/22 --     Oxygen   Maintain Oxygen Saturation -- 88% or higher 88% or higher 88% or higher 88% or higher    Row Name 04/02/22 0900 04/16/22 0900 05/01/22 1300 05/15/22 1400 05/29/22 1400     Response to Exercise   Blood Pressure (Admit) 118/70 122/62 132/70 120/64 104/62   Blood Pressure (Exercise) 140/68 132/64 -- -- --   Blood Pressure (Exit) 108/56 126/74 124/82 112/72 118/60   Heart Rate (Admit) 62 bpm 52 bpm 64 bpm 67 bpm 63 bpm   Heart Rate (Exercise) 76 bpm 73 bpm 73 bpm 79 bpm 82 bpm   Heart Rate (Exit) 56 bpm 54 bpm 56 bpm 66 bpm 60 bpm   Oxygen Saturation (Admit) 98 % 98 % 98 % 99 % 97 %   Oxygen Saturation (Exercise) 94 % 88 % 91 % 92 % 88 %   Oxygen Saturation (Exit) 97 % 94 % 99 % 99 % 97 %   Rating of Perceived Exertion (Exercise) _5 Perceived Dyspnea (Exercise) 0 1 0 1 1   Symptoms _6    Duration Continue with 30 min of aerobic exercise without signs/symptoms of physical distress. Continue with 30 min of aerobic exercise without signs/symptoms of physical distress. Continue with 30 min of aerobic exercise without signs/symptoms of physical distress. Continue with 30 min of aerobic exercise without signs/symptoms of physical distress. Continue with 30 min of aerobic exercise without signs/symptoms of physical distress.   Intensity _7      Progression   Progression Continue to progress workloads to maintain intensity without signs/symptoms of physical distress. Continue to  progress workloads to maintain intensity without signs/symptoms of physical distress. Continue to progress workloads to maintain intensity without signs/symptoms of physical distress. Continue to progress workloads to maintain intensity without signs/symptoms of physical distress. Continue to progress workloads to maintain intensity without signs/symptoms of physical distress.   Average METs 1.85 2.29 2.09 2.19 2.08     Resistance Training   Training Prescription _8    Weight 2 lb 2 lb 2 lb 2 lb 2 lb   Reps 10-15 10-15 10-15 10-15 10-15     Interval Training   Interval Training _9      Treadmill   MPH 1.3 1 1.5 2 1.5   Grade 0 0 0.5 0 0   Minutes _10 METs 2 1.77 2.26 2.53 2.15     Recumbant Bike   Level -- 2 -- -- --   Minutes -- 15 -- -- --   METs --  3.2 -- -- --     NuStep   Level _0 Minutes _1 METs 1.8 2._2 2.3     Biostep-RELP   Level 1 -- 1 -- 3   Minutes 15 -- 15 -- 15   METs 2 -- -- -- 2     Home Exercise Plan   Plans to continue exercise at Home (comment)  walking at home outside or on TM and continue to do PT exercises. Home (comment)  walking at home outside or on TM and continue to do PT exercises. Home (comment)  walking at home outside or on TM and continue to do PT exercises. Home (comment)  walking at home outside or on TM and continue to do PT exercises. Home (comment)  walking at home outside or on TM and continue to do PT exercises.   Frequency Add 2 additional days to program exercise sessions. Add 2 additional days to program exercise sessions. Add 2 additional days to program exercise sessions. Add 2 additional days to program exercise sessions. Add 2 additional days to program exercise sessions.   Initial Home Exercises Provided 02/27/22 02/27/22 02/27/22 02/27/22 02/27/22     Oxygen   Maintain Oxygen Saturation 88% or higher 88% or higher 88% or higher 88% or higher 88% or higher             Exercise Comments:   Exercise Comments     Row Name 02/13/22 6433           Exercise Comments First full day of exercise!  Patient was oriented to gym and equipment including functions, settings, policies, and procedures.  Patient's individual exercise prescription and treatment plan were reviewed.  All starting workloads were established based on the results of the 6 minute walk test done at initial orientation visit.  The plan for exercise progression was also introduced and progression will be customized based on patient's performance and goals.                Exercise Goals and Review:   Exercise Goals     Row Name 02/12/22 1519             Exercise Goals   Increase Physical Activity Yes       Intervention Provide advice, education, support and counseling about physical activity/exercise needs.;Develop an individualized exercise prescription for aerobic and resistive training based on initial evaluation findings, risk stratification, comorbidities and participant's personal goals.       Expected Outcomes Short Term: Attend rehab on a regular basis to increase amount of physical activity.;Long Term: Add in home exercise to make exercise part of routine and to increase amount of physical activity.;Long Term: Exercising regularly at least 3-5 days a week.       Increase Strength and Stamina Yes       Intervention Provide advice, education, support and counseling about physical activity/exercise needs.;Develop an individualized exercise prescription for aerobic and resistive training based on initial evaluation findings, risk stratification, comorbidities and participant's personal goals.       Expected Outcomes Short Term: Increase workloads from initial exercise prescription for resistance, speed, and METs.;Short Term: Perform resistance training exercises routinely during rehab and add in resistance training at home;Long Term: Improve cardiorespiratory fitness, muscular  endurance and strength as measured by increased METs and functional capacity (6MWT)       Able to understand and use rate of perceived exertion (RPE)  scale Yes       Intervention Provide education and explanation on how to use RPE scale       Expected Outcomes Short Term: Able to use RPE daily in rehab to express subjective intensity level;Long Term:  Able to use RPE to guide intensity level when exercising independently       Able to understand and use Dyspnea scale Yes       Intervention Provide education and explanation on how to use Dyspnea scale       Expected Outcomes Short Term: Able to use Dyspnea scale daily in rehab to express subjective sense of shortness of breath during exertion;Long Term: Able to use Dyspnea scale to guide intensity level when exercising independently       Knowledge and understanding of Target Heart Rate Range (THRR) Yes       Intervention Provide education and explanation of THRR including how the numbers were predicted and where they are located for reference       Expected Outcomes Short Term: Able to state/look up THRR;Long Term: Able to use THRR to govern intensity when exercising independently;Short Term: Able to use daily as guideline for intensity in rehab       Able to check pulse independently Yes       Intervention Provide education and demonstration on how to check pulse in carotid and radial arteries.;Review the importance of being able to check your own pulse for safety during independent exercise       Expected Outcomes Short Term: Able to explain why pulse checking is important during independent exercise;Long Term: Able to check pulse independently and accurately       Understanding of Exercise Prescription Yes       Intervention Provide education, explanation, and written materials on patient's individual exercise prescription       Expected Outcomes Short Term: Able to explain program exercise prescription;Long Term: Able to explain home exercise  prescription to exercise independently                Exercise Goals Re-Evaluation :  Exercise Goals Re-Evaluation     Row Name 02/13/22 8527 02/22/22 1241 02/27/22 1000 03/07/22 0847 03/19/22 1608     Exercise Goal Re-Evaluation   Exercise Goals Review Increase Physical Activity;Able to understand and use rate of perceived exertion (RPE) scale;Knowledge and understanding of Target Heart Rate Range (THRR);Understanding of Exercise Prescription;Able to understand and use Dyspnea scale;Able to check pulse independently;Increase Strength and Stamina Increase Physical Activity;Increase Strength and Stamina;Understanding of Exercise Prescription Increase Physical Activity;Increase Strength and Stamina;Able to understand and use rate of perceived exertion (RPE) scale;Able to understand and use Dyspnea scale;Knowledge and understanding of Target Heart Rate Range (THRR);Able to check pulse independently;Understanding of Exercise Prescription Increase Physical Activity;Increase Strength and Stamina;Understanding of Exercise Prescription Increase Physical Activity;Increase Strength and Stamina;Understanding of Exercise Prescription   Comments Reviewed RPE and dyspnea scales, THR and program prescription with pt today.  Pt voiced understanding and was given a copy of goals to take home. Capucine is off to a good start in rehab. She has completed her first four full days of exercise thus far.  We will continue to montior her progress. Reviewed home exercise with pt today.  Pt plans to walk and do PT exercises for exercise.  Reviewed THR, pulse, RPE, sign and symptoms, pulse oximetery and when to call 911 or MD.  Also discussed weather considerations and indoor options.  Pt voiced understanding. Shantoria has been doing well in  rehab. She improved to level 2 on the biostep machine, and the T4 NuStep. She has also increased her speed on the treadmill to 1.4 mph. She has tolerated using 2 lb hand weights for  resistance training. We will continue to monitor her progress in the program. Glendola continues to do well in rehab. She was out of town the last week and only came for a couple sessions during this review. Walking on the treadmill is a challenge for her and we hope to see her increase her speed over time. She would benefit from increasing her handweights to 3 lbs. Will continue to monitor.   Expected Outcomes Short: Use RPE daily to regulate intensity. Long: Follow program prescription in THR. Short: Continue to attend rehab regularly Long: Continue to follow program prescription Short: add 1-2 days of exercise at home on off days of class. Long: become independent with exercise routine. Short: continue to increase speed on treadmill. Long: Continue to increase strength and stamina. Short: Increase handweights to 3 lbs Long: Continue to increase overall MET level    Row Name 04/02/22 0940 04/05/22 1014 04/12/22 1010 04/16/22 0906 05/01/22 1403     Exercise Goal Re-Evaluation   Exercise Goals Review Increase Physical Activity;Increase Strength and Stamina;Understanding of Exercise Prescription Increase Physical Activity;Increase Strength and Stamina;Understanding of Exercise Prescription Increase Physical Activity;Increase Strength and Stamina;Understanding of Exercise Prescription Increase Physical Activity;Increase Strength and Stamina;Understanding of Exercise Prescription Increase Physical Activity;Increase Strength and Stamina;Understanding of Exercise Prescription   Comments Leyana is doing well in rehab. She improved to level 3 on the T4. She also worked her speed back up to 1.3 mph on the treadmill. Wendi is up to 2 METs on the BioStep as well. We will continue to monitor her progress in the program. Mignonne reports staying active, but not doing any exercise outside of rehab. She reports she is not as strong as she used to be. Reviewed that she could use her PT exercises and walking for  exercise outside of rehab as she discussed with EP when going over home exercise. She reports that she has used cans of soup in the past for weighted exercise. Amalee reports staying active and going in her yard, but not doing any exercise outside of rehab. She reports she is not as strong as she used to be and feels weak. Reviewed again that she could use her PT exercises and walking for exercise outside of rehab as she discussed with EP when going over home exercise. Kaytelyn is doing well in rehab. She started using the recumbent bike at level 2 and did well. She also improved her overall average MET level to 2.29 METs. She has also tolerated using 2 lb hand weights for resistance training and may benefit from trying 3 lbs. We will continue to monitor her progress. Brittin is doing well in rehab. She recently improved to 1.5 mph and an incline of 0.5% on the treadmill. She also went up to level 4 on the T4. She has also tolerated using 2 lb hand weights for resistance training as well. We will continue to monitor her progress in the program.   Expected Outcomes Short: Increase handweights to 3 lbs Long: Continue to improve strength and stamina. Short: begin structured exercise outside of rehab Long: Continue to improve strength and stamina. Short: begin structured exercise outside of rehab Long: Continue to improve strength and stamina. Short: try using 3 lb hand weights. Long: Continue to improve overall MET levels. Short: try using 3  lb hand weights. Long: Continue to improve overall MET levels.    [place] Name 05/15/22 1502 05/17/22 1018 05/29/22 1432         Exercise Goal Re-Evaluation   Exercise Goals Review Increase Physical Activity;Increase Strength and Stamina;Understanding of Exercise Prescription Increase Physical Activity;Increase Strength and Stamina;Understanding of Exercise Prescription Increase Physical Activity;Increase Strength and Stamina;Understanding of Exercise Prescription      Comments Deja continues to do well in rehab. She recently increased her overall average MET level back up to 2.19 METs. She also increased her speed on the treadmill to 2 mph. She also has tolerated 2 lb hand weights for resistance training as well. We will continue to monitor her progress in the program. Hildagarde reports staying active and going in her yard, but not doing any exercise outside of rehab, she has been struggling with fatigue and her ADLs. She has tried some of the exercises from the home exercise packet and her treadmill, but not consistently. She reports she is not as strong as she used to be and feels weak. She sees her doctor next week to follow up about fatigue. Laine has been doing well in rehab. She has consistently been walking at a speed of 1.5 mph on the treadmill, but would benefit from adding some incline. She has also tolerated level 4 on the T4. She has tolerated 2 lb hand weights for resistance training. We will continue to monitor her progress in the program.     Expected Outcomes Short: try using 3 lb hand weights. Long: Continue to increase strength and stamina. Short: follow up with MD about fatigue, strive to inlcude 1-2 days of structured exercise at home Long: Continue to increase strength and stamina. Short: Add incline on the treadmill. Long: Continue to improve strength and stamina.              Discharge Exercise Prescription (Final Exercise Prescription Changes):  Exercise Prescription Changes - 05/29/22 1400       Response to Exercise   Blood Pressure (Admit) 104/62    Blood Pressure (Exit) 118/60    Heart Rate (Admit) 63 bpm    Heart Rate (Exercise) 82 bpm    Heart Rate (Exit) 60 bpm    Oxygen Saturation (Admit) 97 %    Oxygen Saturation (Exercise) 88 %    Oxygen Saturation (Exit) 97 %    Rating of Perceived Exertion (Exercise) 14    Perceived Dyspnea (Exercise) 1    Symptoms none    Duration Continue with 30 min of aerobic exercise without  signs/symptoms of physical distress.    Intensity THRR unchanged      Progression   Progression Continue to progress workloads to maintain intensity without signs/symptoms of physical distress.    Average METs 2.08      Resistance Training   Training Prescription Yes    Weight 2 lb    Reps 10-15      Interval Training   Interval Training No      Treadmill   MPH 1.5    Grade 0    Minutes 15    METs 2.15      NuStep   Level 3    Minutes 15    METs 2.3      Biostep-RELP   Level 3    Minutes 15    METs 2      Home Exercise Plan   Plans to continue exercise at Home (comment)   walking at home outside or  on TM and continue to do PT exercises.   Frequency Add 2 additional days to program exercise sessions.    Initial Home Exercises Provided 02/27/22      Oxygen   Maintain Oxygen Saturation 88% or higher             Nutrition:  Target Goals: Understanding of nutrition guidelines, daily intake of sodium <1516m, cholesterol <2053m calories 30% from fat and 7% or less from saturated fats, daily to have 5 or more servings of fruits and vegetables.  Education: All About Nutrition: -Group instruction provided by verbal, written material, interactive activities, discussions, models, and posters to present general guidelines for heart healthy nutrition including fat, fiber, MyPlate, the role of sodium in heart healthy nutrition, utilization of the nutrition label, and utilization of this knowledge for meal planning. Follow up email sent as well. Written material given at graduation. [facility]  Education need identified 02/12/22  Date 02/22/22  Educ[place] 1- Verbalizes Understanding       Biometrics:  Pre Biometrics - 02/12/22 1520       Pre Biometrics   Height _0  (1.651 m)    Weight 143 lb 11.2 oz (65.2 kg)    BMI (Calculated) 23.91    Single Leg Stand 2.1 seconds               Nutrition Therapy Plan and Nutrition Goals:  Nutrition Therapy & Goals - 02/22/22 1215       Nutrition Therapy   Diet Heart healthy, low Na    Drug/Food Interactions Statins/Certain Fruits    Protein (specify units) 60-65g    Fiber 21 grams    Whole Grain Foods 3 servings    Saturated Fats 12 max. grams    Fruits and Vegetables 8 servings/day    Sodium 2 grams      Personal Nutrition Goals   Nutrition Goal ST: add protein/fat to snacks (peanut butter, cottage cheese, cheese cubes), try protein ball recipe LT: Maintain weight, meet calorie/protein needs    Comments [age].o. F admitted to rehab for chronic systolic heart failure. PMHx includes HTN, HLD, osteoporosis, NICM, anxiety, cardiomegaly, chronic fatigue. Relevant medications includes Ca-vit D, lasix, K+, MVI, evesta, zocor. PYP Score: 61. Vegetables & Fruits 8/12. Breads, Grains & Cereals 5/12. Red & Processed Meat 8/12. Poultry 2/2. Fish & Shellfish 1/4. Beans, Nuts & Seeds 2/4. Milk & Dairy Foods 3/6. Toppings, Oils, Seasonings & Salt 12/20. Sweets, Snacks & Restaurant Food 11/14. Beverages 9/10.  MaHannahgraceries to eat smaller, more frequent meals as her appetite has been lower. She reports her doctor told her not to lose anymore weight. She enjoys fruit and vegetables and keeps starchy vegetables and grains to a minimum on her plate. She enjoys chicken as her primary source of protein and is open to including other sources of protein for snacks such as peanut butter, cheese, and cottage cheese to add with her fruit to bump up the nutrition. She uses olive oil and some butter for cooking and she does not salt her food. For breakfast she enjoys having a bar or cream of wehat or oatmeal - encouraged her to have oatmeal more often and bulking up it with fruit like berries, and nuts/seeds. She enjoys sourdough bread, but does not eat it frequently. She used to enjoy sweets more, but has not had as much of a taste for them -  discussed  another snack option such as energy balls that include nut/seed butter, oats, some liquid sweetener or water to add mositure, and add ins like dried friet, chocolate chips, or cocoa powder - she was interested in this. She limits her fluid, but water is her drink of choice. Discussed general heart healthy eating.      Intervention Plan   Intervention Prescribe, educate and counsel regarding individualized specific dietary modifications aiming towards targeted core components such as weight, hypertension, lipid management, diabetes, heart failure and other comorbidities.    Expected Outcomes Short Term Goal: Understand basic principles of dietary content, such as calories, fat, sodium, cholesterol and nutrients.;Short Term Goal: A plan has been developed with personal nutrition goals set during dietitian appointment.;Long Term Goal: Adherence to prescribed nutrition plan.             Nutrition Assessments:  MEDIFICTS Score Key: ?70 Need to make dietary changes  40-70 Heart Healthy Diet ? 40 Therapeutic Level Cholesterol Diet  Flowsheet Row Pulmonary Rehab from 02/12/2022 in [facility]  Picture Your Plate Total Score on Admission 61      Picture Your Plate Scores: <84 Unhealthy dietary pattern with much room for improvement. 41-50 Dietary pattern unlikely to meet recommendations for good health and room for improvement. 51-60 More healthful dietary pattern, with some room for improvement.  >60 Healthy dietary pattern, although there may be some specific behaviors that could be improved.   Nutrition Goals Re-Evaluation:  Nutrition Goals Re-Evaluation     [place] Name 04/05/22 0957 04/12/22 1007 05/17/22 0957         Goals   Nutrition Goal ST: continue to add protein/fat to snacks (peanut butter, cottage cheese, cheese cubes), try protein ball recipe LT: Maintain weight, meet calorie/protein needs ST: continue to add protein/fat to snacks (peanut butter,  cottage cheese, cheese cubes), try protein ball recipe LT: Maintain weight, meet calorie/protein needs ST: continue to add protein/fat to snacks (peanut butter, cottage cheese, cheese cubes), continue practicing MyPLate structure and variety LT: Maintain weight, meet calorie/protein needs     Comment Lamari reports continuing to make herself eat - due to her chronic nausea. She continues to include fruits, vegetables, lean proteins, and heart healthy fats. She has been having cottage cheese and peanut butter during the day as well.  She has not tried the protein ball recipe yet, but would like to. Shaneal reports continuing to make herself eat - due to her chronic nausea. She continues to include fruits, vegetables, lean proteins, and heart healthy fats. She continues to have cottage cheese and peanut butter during the day as well.  She has still not tried the protein ball recipe yet, but would like to. Joslyn reports continuing to make herself eat even without the nausea; she just got off the amlodipine. She continues to include fruits, vegetables, lean proteins, and heart healthy fats. She continues to have cottage cheese and peanut butter during the day as well.  She has still not tried the protein ball recipe yet, but would like to; by the time she remembers it is after she went food shopping.     Expected Outcome ST: continue to add protein/fat to snacks (peanut butter, cottage cheese, cheese cubes), try protein ball recipe LT: Maintain weight, meet calorie/protein needs ST: continue to add protein/fat to snacks (peanut butter, cottage cheese, cheese cubes), try protein ball recipe LT: Maintain weight, meet calorie/protein needs ST: continue to add protein/fat to snacks (peanut butter, cottage cheese, cheese  cubes), continue practicing MyPLate structure and varietyLT: Maintain weight, meet calorie/protein needs              Nutrition Goals Discharge (Final Nutrition Goals Re-Evaluation):   Nutrition Goals Re-Evaluation - 05/17/22 0957       Goals   Nutrition Goal ST: continue to add protein/fat to snacks (peanut butter, cottage cheese, cheese cubes), continue practicing MyPLate structure and variety LT: Maintain weight, meet calorie/protein needs    Comment Shalva reports continuing to make herself eat even without the nausea; she just got off the amlodipine. She continues to include fruits, vegetables, lean proteins, and heart healthy fats. She continues to have cottage cheese and peanut butter during the day as well.  She has still not tried the protein ball recipe yet, but would like to; by the time she remembers it is after she went food shopping.    Expected Outcome ST: continue to add protein/fat to snacks (peanut butter, cottage cheese, cheese cubes), continue practicing MyPLate structure and varietyLT: Maintain weight, meet calorie/protein needs             Psychosocial: Target Goals: Acknowledge presence or absence of significant depression and/or stress, maximize coping skills, provide positive support system. Participant is able to verbalize types and ability to use techniques and skills needed for reducing stress and depression.   Education: Stress, Anxiety, and Depression - Group verbal and visual presentation to define topics covered.  Reviews how body is impacted by stress, anxiety, and depression.  Also discusses healthy ways to reduce stress and to treat/manage anxiety and depression.  Written material given at graduation. Flowsheet Row Pulmonary Rehab from 05/10/2022 in [facility] and Pulmonary Rehab  Date 03/22/22  Educator [facility]  Instruction Review Code 1- [employer] Understanding       Education: Sleep Hygiene -Provides group verbal and written instruction about how sleep can affect your health.  Define sleep hygiene, discuss sleep cycles and impact of sleep habits. Review good sleep hygiene tips.    Initial Review & Psychosocial Screening:  Initial  Psych Review & Screening - 02/01/22 0913       Initial Review   Current issues with None Identified      Family Dynamics   Good Support System? Yes    Comments Chery is in good spirtirs and has four kids that she can turn to for support. She has her husband that she looks after at times and has great support from him.      Barriers   Psychosocial barriers to participate in program The patient should benefit from training in stress management and relaxation.;There are no identifiable barriers or psychosocial needs.      Screening Interventions   Interventions Encouraged to exercise;Provide feedback about the scores to participant;To provide support and resources with identified psychosocial needs    Expected Outcomes Long Term Goal: Stressors or current issues are controlled or eliminated.;Short Term goal: Utilizing psychosocial counselor, staff and physician to assist with identification of specific Stressors or current issues interfering with healing process. Setting desired goal for each stressor or current issue identified.;Short Term goal: Identification and review with participant of any Quality of Life or Depression concerns found by scoring the questionnaire.;Long Term goal: The participant improves quality of Life and PHQ9 Scores as seen by post scores and/or verbalization of changes             Quality of Life Scores:  Quality of Life - 02/12/22 1523       Quality  of Life   Select Quality of Life      Quality of Life Scores   Health/Function Pre 22.23 %    Socioeconomic Pre 24 %    Psych/Spiritual Pre 23.36 %    Family Pre 25.2 %    GLOBAL Pre 23.31 %            Scores of 19 and below usually indicate a poorer quality of life in these areas.  A difference of  2-3 points is a clinically meaningful difference.  A difference of 2-3 points in the total score of the Quality of Life Index has been associated with significant improvement in overall quality of life,  self-image, physical symptoms, and general health in studies assessing change in quality of life.  PHQ-9: Review Flowsheet       04/05/2022 03/15/2022 02/12/2022  Depression screen PHQ 2/9  Decreased Interest _0 Down, Depressed, Hopeless 0 1 1  PHQ - 2 Score _1 Altered sleeping 0 1 1  Tired, decreased energy 0 1 1  Change in appetite 0 1 3  Feeling bad or failure about yourself  1 0 1  Trouble concentrating 0 0 0  Moving slowly or fidgety/restless 0 0 1  Suicidal thoughts 0 0 0  PHQ-9 Score _2 Difficult doing work/chores Somewhat difficult Somewhat difficult Somewhat difficult   Interpretation of Total Score  Total Score Depression Severity:  1-4 = Minimal depression, 5-9 = Mild depression, 10-14 = Moderate depression, 15-19 = Moderately severe depression, 20-27 = Severe depression   Psychosocial Evaluation and Intervention:  Psychosocial Evaluation - 02/01/22 0915       Psychosocial Evaluation & Interventions   Interventions Relaxation education;Stress management education;Encouraged to exercise with the program and follow exercise prescription    Comments Jovanka is in good spirtirs and has four kids that she can turn to for support. She has her husband that she looks after at times and has great support from him.    Expected Outcomes Short: Start [facility] to help with mood. Long: Maintain a healthy mental state.    Continue Psychosocial Services  Follow up required by staff             Psychosocial Re-Evaluation:  Psychosocial Re-Evaluation     [place] Name 02/27/22 1023 04/05/22 1000 04/12/22 1003 05/17/22 1007       Psychosocial Re-Evaluation   Current issues with None Identified Current Stress Concerns Current Stress Concerns;Current Sleep Concerns Current Stress Concerns;Current Sleep Concerns    Comments Patient reports that she does have some trouble getting back to sleep around 3 am, but for the most part sleeps well and has no new stress concerns. She  went up to her house by [facility] last weekend, she has missed going - her husband does not like to go and he needs her to help to remember to take his medications. She reports always having stress looking after her husband. She relies on "the good lord" for support as well her husbands Network engineer and daughters. She is also getting help right now from a maid. She reports feeling tired as she is always busy and she is more tired the day after coming to rehab. She has a poor appetite sometimes due to chronic nausea. She reports her friends have either passed or are in nursing homes - she will visit occassionally. She sometimes will take a nap, watch Murder She Wrote, sit and watch the birds, and play with  her dog to help to reduce stress. Michiko recently had a birthday and went to see a movie ([place]). She reports no major stressors at this time, but she always has things going on. This weekend she went to a family event and had a good time, but she is tired this week because of it. She continues to feel weak and get worn out easily from how busy she is. She reports always having stress looking after her husband. She continues to rely on "the good lord" for support as well her husbands Network engineer and daughters. She is also getting help right now from a maid which she reports has really changed her life. She sometimes will take a nap, enjoy mysteries, sit and watch the birds, and play with her dog to help to reduce stress. She continues to sleep "too well"; she gets about 7 hours of sleep a night and will take a nap as well. Ikeisha reports no major stressors at this time, but she always has things going on, but she feels like she has a lot to do and she has been very fatigued. She reports that her husband needs a fair amount of help as well which can cause her stress. Her friend is 60 y.o. and her daughter who usually takes care of her recently passed; Aila has been going over to help her  friends' other daughters take care of her. She continues to rely on "the good lord" for support as well her husbands Network engineer and daughters. She is also getting help right now from a maid which she reports has really changed her life. She sometimes will take a nap, enjoy mysteries, sit and watch the birds, and play with her dog to help to reduce stress. She continues to sleep "too well"; she gets about 7 hours of sleep a night and will take a nap as well.    Expected Outcomes Short: continue to attend pulmonary rehab for mental health benefits. Long: maintain good mental health habits independently ST: continue to attend pulmonary rehab for mental health benefits. LT: maintain good mental health habits independently ST: continue to attend pulmonary rehab for mental health benefits. LT: maintain good mental health habits independently ST: continue to attend pulmonary rehab for mental health benefits. LT: maintain good mental health habits independently    Interventions Encouraged to attend Pulmonary Rehabilitation for the exercise Encouraged to attend Pulmonary Rehabilitation for the exercise Encouraged to attend Pulmonary Rehabilitation for the exercise Encouraged to attend Pulmonary Rehabilitation for the exercise    Continue Psychosocial Services  Follow up required by staff Follow up required by staff Follow up required by staff Follow up required by staff      Initial Review   Source of Stress Concerns -- Family Family Family;Chronic Illness             Psychosocial Discharge (Final Psychosocial Re-Evaluation):  Psychosocial Re-Evaluation - 05/17/22 1007       Psychosocial Re-Evaluation   Current issues with Current Stress Concerns;Current Sleep Concerns    Comments Carra reports no major stressors at this time, but she always has things going on, but she feels like she has a lot to do and she has been very fatigued. She reports that her husband needs a fair amount of help as well which  can cause her stress. Her friend is 42 y.o. and her daughter who usually takes care of her recently passed; Arrayah has been going over to help her friends' other daughters  take care of her. She continues to rely on "the good lord" for support as well her husbands Network engineer and daughters. She is also getting help right now from a maid which she reports has really changed her life. She sometimes will take a nap, enjoy mysteries, sit and watch the birds, and play with her dog to help to reduce stress. She continues to sleep "too well"; she gets about 7 hours of sleep a night and will take a nap as well.    Expected Outcomes ST: continue to attend pulmonary rehab for mental health benefits. LT: maintain good mental health habits independently    Interventions Encouraged to attend Pulmonary Rehabilitation for the exercise    Continue Psychosocial Services  Follow up required by staff      Initial Review   Source of Stress Concerns Family;Chronic Illness             Education: Education Goals: Education classes will be provided on a weekly basis, covering required topics. Participant will state understanding/return demonstration of topics presented.  Learning Barriers/Preferences:  Learning Barriers/Preferences - 02/01/22 0909       Learning Barriers/Preferences   Learning Barriers None    Learning Preferences None             General Pulmonary Education Topics:  Infection Prevention: - Provides verbal and written material to individual with discussion of infection control including proper hand washing and proper equipment cleaning during exercise session. Flowsheet Row Pulmonary Rehab from 05/10/2022 in [facility] Cardiac and Pulmonary Rehab  Date 02/12/22  Educator [facility]  Instruction Review Code 1- Verbalizes Understanding       Falls Prevention: - Provides verbal and written material to individual with discussion of falls prevention and safety. Flowsheet Row Pulmonary Rehab from  05/10/2022 in [facility] Cardiac and Pulmonary Rehab  Date 02/12/22  Educator [facility]  Instruction Review Code 1- Verbalizes Understanding       Chronic Lung Disease Review: - Group verbal instruction with posters, models, PowerPoint presentations and videos,  to review new updates, new respiratory medications, new advancements in procedures and treatments. Providing information on websites and "800" numbers for continued self-education. Includes information about supplement oxygen, available portable oxygen systems, continuous and intermittent flow rates, oxygen safety, concentrators, and Medicare reimbursement for oxygen. Explanation of Pulmonary Drugs, including class, frequency, complications, importance of spacers, rinsing mouth after steroid MDI's, and proper cleaning methods for nebulizers. Review of basic lung anatomy and physiology related to function, structure, and complications of lung disease. Review of risk factors. Discussion about methods for diagnosing sleep apnea and types of masks and machines for OSA. Includes a review of the use of types of environmental controls: home humidity, furnaces, filters, dust mite/pet prevention, HEPA vacuums. Discussion about weather changes, air quality and the benefits of nasal washing. Instruction on Warning signs, infection symptoms, calling MD promptly, preventive modes, and value of vaccinations. Review of effective airway clearance, coughing and/or vibration techniques. Emphasizing that all should Create an Action Plan. Written material given at graduation. Flowsheet Row Pulmonary Rehab from 05/10/2022 in [facility] Cardiac and Pulmonary Rehab  Date 03/15/22  Educator [facility]  Instruction Review Code 1- Verbalizes Understanding       AED/CPR: - Group verbal and written instruction with the use of models to demonstrate the basic use of the AED with the basic ABC's of resuscitation.    Anatomy and Cardiac Procedures: - Group verbal and visual presentation and  models provide information about basic cardiac anatomy and function. Reviews  the testing methods done to diagnose heart disease and the outcomes of the test results. Describes the treatment choices: Medical Management, Angioplasty, or Coronary Bypass Surgery for treating various heart conditions including Myocardial Infarction, Angina, Valve Disease, and Cardiac Arrhythmias.  Written material given at graduation. Flowsheet Row Pulmonary Rehab from 05/10/2022 in [facility] Cardiac and Pulmonary Rehab  Education need identified 02/12/22  Date 04/12/22  Educator [facility]  Instruction Review Code 1- Verbalizes Understanding       Medication Safety: - Group verbal and visual instruction to review commonly prescribed medications for heart and lung disease. Reviews the medication, class of the drug, and side effects. Includes the steps to properly store meds and maintain the prescription regimen.  Written material given at graduation.   Other: -Provides group and verbal instruction on various topics (see comments)   Knowledge Questionnaire Score:  Knowledge Questionnaire Score - 02/12/22 1526       Knowledge Questionnaire Score   Pre Score 22/26              Core Components/Risk Factors/Patient Goals at Admission:  Personal Goals and Risk Factors at Admission - 02/12/22 1523       Core Components/Risk Factors/Patient Goals on Admission    Weight Management Yes;Weight Maintenance    Intervention Weight Management: Develop a combined nutrition and exercise program designed to reach desired caloric intake, while maintaining appropriate intake of nutrient and fiber, sodium and fats, and appropriate energy expenditure required for the weight goal.;Weight Management: Provide education and appropriate resources to help participant work on and attain dietary goals.;Weight Management/Obesity: Establish reasonable short term and long term weight goals.    Admit Weight 143 lb 11.2 oz (65.2 kg)    Goal  Weight: Short Term 143 lb 11.2 oz (65.2 kg)    Goal Weight: Long Term 143 lb 11.2 oz (65.2 kg)    Expected Outcomes Short Term: Continue to assess and modify interventions until short term weight is achieved;Long Term: Adherence to nutrition and physical activity/exercise program aimed toward attainment of established weight goal;Weight Maintenance: Understanding of the daily nutrition guidelines, which includes 25-35% calories from fat, 7% or less cal from saturated fats, less than 272m cholesterol, less than 1.5gm of sodium, & 5 or more servings of fruits and vegetables daily;Understanding recommendations for meals to include 15-35% energy as protein, 25-35% energy from fat, 35-60% energy from carbohydrates, less than 2055mof dietary cholesterol, 20-35 gm of total fiber daily;Understanding of distribution of calorie intake throughout the day with the consumption of 4-5 meals/snacks    Improve shortness of breath with ADL's Yes    Intervention Provide education, individualized exercise plan and daily activity instruction to help decrease symptoms of SOB with activities of daily living.    Expected Outcomes Short Term: Improve cardiorespiratory fitness to achieve a reduction of symptoms when performing ADLs;Long Term: Be able to perform more ADLs without symptoms or delay the onset of symptoms    Heart Failure Yes    Intervention Provide a combined exercise and nutrition program that is supplemented with education, support and counseling about heart failure. Directed toward relieving symptoms such as shortness of breath, decreased exercise tolerance, and extremity edema.    Expected Outcomes Improve functional capacity of life;Short term: Attendance in program 2-3 days a week with increased exercise capacity. Reported lower sodium intake. Reported increased fruit and vegetable intake. Reports medication compliance.;Short term: Daily weights obtained and reported for increase. Utilizing diuretic protocols  set by physician.;Long term: Adoption of self-care skills  and reduction of barriers for early signs and symptoms recognition and intervention leading to self-care maintenance.    Hypertension Yes    Intervention Provide education on lifestyle modifcations including regular physical activity/exercise, weight management, moderate sodium restriction and increased consumption of fresh fruit, vegetables, and low fat dairy, alcohol moderation, and smoking cessation.;Monitor prescription use compliance.    Expected Outcomes Short Term: Continued assessment and intervention until BP is < 140/9m HG in hypertensive participants. < 130/822mHG in hypertensive participants with diabetes, heart failure or chronic kidney disease.;Long Term: Maintenance of blood pressure at goal levels.    Lipids Yes    Intervention Provide education and support for participant on nutrition & aerobic/resistive exercise along with prescribed medications to achieve LDL <7010mHDL >9m107m  Expected Outcomes Short Term: Participant states understanding of desired cholesterol values and is compliant with medications prescribed. Participant is following exercise prescription and nutrition guidelines.;Long Term: Cholesterol controlled with medications as prescribed, with individualized exercise RX and with personalized nutrition plan. Value goals: LDL < 70mg18mL > 40 mg.             Education:Diabetes - Individual verbal and written instruction to review signs/symptoms of diabetes, desired ranges of glucose level fasting, after meals and with exercise. Acknowledge that pre and post exercise glucose checks will be done for 3 sessions at entry of program.   Know Your Numbers and Heart Failure: - Group verbal and visual instruction to discuss disease risk factors for cardiac and pulmonary disease and treatment options.  Reviews associated critical values for Overweight/Obesity, Hypertension, Cholesterol, and Diabetes.  Discusses  basics of heart failure: signs/symptoms and treatments.  Introduces Heart Failure Zone chart for action plan for heart failure.  Written material given at graduation. Flowsheet [facility]  Education need identified 02/12/22  Date 05/10/22  Educator SB  Instruction Review Code 1- Verbalizes Understanding       Core Components/Risk Factors/Patient Goals Review:   Goals and Risk Factor Review     Row Name 02/27/22 1021 04/05/22 1014 04/12/22 1008 05/17/22 0958       Core Components/Risk Factors/Patient Goals Review   Personal Goals Review Weight Management/Obesity;Heart Failure;Develop more efficient breathing techniques such as purse lipped breathing and diaphragmatic breathing and practicing self-pacing with activity.;Hypertension;Lipids;Improve shortness of breath with ADL's Weight Management/Obesity;Heart Failure;Develop more efficient breathing techniques such as purse lipped breathing and diaphragmatic breathing and practicing self-pacing with activity.;Hypertension;Lipids;Improve shortness of breath with ADL's Weight Management/Obesity;Heart Failure;Develop more efficient breathing techniques such as purse lipped breathing and diaphragmatic breathing and practicing self-pacing with activity.;Hypertension;Lipids;Improve shortness of breath with ADL's Weight Management/Obesity;Heart Failure;Develop more efficient breathing techniques such as purse lipped breathing and diaphragmatic breathing and practicing self-pacing with activity.;Hypertension;Lipids;Improve shortness of breath with ADL's    Review Patient reports that she monitors BP at home and it varies depending on the day. She does report taking all meds as prescribed. He weight has been consistent. MargaEthnerts losing about 8 pounds before coming here to her medications, but has now been weight stable. She reports thats she has been short of breath, but will pause to catch  her breath. She reports continuing to use PLB to help control shortness of breath during exercise and reports using it frequently. She has a pulse oximeter at home as well. She checks her BP daily - 124/60 this morning at home, it will get to be 140s/784O/962Xsystolic BP, but her HR is on  the lower end. She reports not seeing her cardiologist until the end of August and has an echocardiogram at the beginning of August. Lawson reports that has now been weight stable. She reports thats she has been short of breath, but will pause to catch her breath. She reports continuing to use PLB to help control shortness of breath during exercise and reports using it frequently. She has a pulse oximeter at home as well. She checks her BP daily -  122/62 this morning at rehb, she reports that is will rang from 300/923R for systolic BP, but her HR is on the lower end. She reports not seeing her cardiologist until the end of August and has an echocardiogram at the beginning of August. She continues to take her medications as prescribed, she reports no issues aside from chronic nausea which she has reported to her doctors. Encouraged her to talk with her doctor about getting tired more easily and feeling weak. Araceli reports that her weight stable has been stable. She checks her BP daily -  120/72 this morning at rehab, she reports that is will range from 007/622Q for systolic BP, but her HR is on the lower end. She reports she will see her cardiologist next week and has been referred to see Dr. Caryl Comes about her low HR and EKG in October. She continues to take her medications as prescribed; she is off of amlodopine and her nausea has left! She continues to have fatigue; she has let her MD know and he ordered an EKG and she will follow-up with him next week when she goes to the MD.    Expected Outcomes Short: contiue to monitor BP and weight at home. Long: continue healthy behaviors to control cardiac risk factors. Short: contiue  to monitor BP and weight at home. Long: continue healthy behaviors to control cardiac risk factors. Short: contiue to monitor BP and weight at home, soeak with doctor. Long: continue healthy behaviors to control cardiac risk factors. Short: contiue to monitor BP and weight at home, follow up with MD about fatigue Long: continue healthy behaviors to control cardiac risk factors.             Core Components/Risk Factors/Patient Goals at Discharge (Final Review):   Goals and Risk Factor Review - 05/17/22 0958       Core Components/Risk Factors/Patient Goals Review   Personal Goals Review Weight Management/Obesity;Heart Failure;Develop more efficient breathing techniques such as purse lipped breathing and diaphragmatic breathing and practicing self-pacing with activity.;Hypertension;Lipids;Improve shortness of breath with ADL's    Review Fleda reports that her weight stable has been stable. She checks her BP daily -  120/72 this morning at rehab, she reports that is will range from 333/545G for systolic BP, but her HR is on the lower end. She reports she will see her cardiologist next week and has been referred to see Dr. Caryl Comes about her low HR and EKG in October. She continues to take her medications as prescribed; she is off of amlodopine and her nausea has left! She continues to have fatigue; she has let her MD know and he ordered an EKG and she will follow-up with him next week when she goes to the MD.    Expected Outcomes Short: contiue to monitor BP and weight at home, follow up with MD about fatigue Long: continue healthy behaviors to control cardiac risk factors.             ITP Comments:  ITP Comments  [place] Name 02/01/22 [phone] 02/12/22 1507 02/13/22 0952 02/14/22 0842 02/22/22 1149   ITP Comments Virtual Visit completed. Patient informed on EP and RD appointment and 6 Minute walk test. Patient also informed of patient health questionnaires on My Chart. Patient Verbalizes  understanding. Visit diagnosis can be found in [facility] 01/11/2022. Completed 6MWT and gym orientation. Initial ITP created and sent for review to Dr. Zetta Bills, Medical Director. First full day of exercise!  Patient was oriented to gym and equipment including functions, settings, policies, and procedures.  Patient's individual exercise prescription and treatment plan were reviewed.  All starting workloads were established based on the results of the 6 minute walk test done at initial orientation visit.  The plan for exercise progression was also introduced and progression will be customized based on patient's performance and goals. 30 Day review completed. Medical Director ITP review done, changes made as directed, and signed approval by Medical Director.  NEW Completed initial RD consultation    [place] Name 03/14/22 1253 04/11/22 0936 05/09/22 0735 06/06/22 1327     ITP Comments 30 Day review completed. Medical Director ITP review done, changes made as directed, and signed approval by Medical Director. 30 Day review completed. Medical Director ITP review done, changes made as directed, and signed approval by Medical Director. 30 Day review completed. Medical Director ITP review done, changes made as directed, and signed approval by Medical Director. 30 Day review completed. Medical Director ITP review done, changes made as directed, and signed approval by Medical Director.             Comments:

## 2022-06-07 ENCOUNTER — Encounter: Payer: Medicare Other | Admitting: *Deleted

## 2022-06-07 DIAGNOSIS — I5022 Chronic systolic (congestive) heart failure: Secondary | ICD-10-CM | POA: Diagnosis not present

## 2022-06-07 NOTE — Progress Notes (Signed)
Daily Session Note  Patient Details  Name: Robin Ortiz MRN: [id number] Date of Birth: [birth year] Referring Provider:   Flowsheet Row Pulmonary Rehab from 02/12/2022 in [facility] Cardiac and Pulmonary Rehab  Referring Provider Gollan       Encounter Date: 06/07/2022  Check In:  Session Check In - 06/07/22 1036       Check-In   Supervising physician immediately available to respond to emergencies See telemetry face sheet for immediately available ER MD    Location [facility]-Cardiac & Pulmonary Rehab    Staff Present Heath Lark, RN, BSN, CCRP;Joseph [place], RCP,RRT,BSRT;Kelly [place], [place], ACSM CEP, Exercise Physiologist    Virtual Visit No    Medication changes reported     No    Fall or balance concerns reported    No    Warm-up and Cool-down Performed on first and last piece of equipment    Resistance Training Performed Yes    VAD Patient? No    PAD/SET Patient? No      Pain Assessment   Currently in Pain? No/denies                Social History   Tobacco Use  Smoking Status Never  Smokeless Tobacco Never    Goals Met:  Proper associated with RPD/PD & O2 Sat Independence with exercise equipment Exercise tolerated well No report of concerns or symptoms today  Goals Unmet:  Not Applicable  Comments: Pt able to follow exercise prescription today without complaint.  Will continue to monitor for progression.    Dr. Emily Filbert is Medical Director for [place].  Dr. Ottie Glazier is Medical Director for [facility] Pulmonary Rehabilitation.

## 2022-06-12 ENCOUNTER — Encounter: Payer: Medicare Other | Admitting: *Deleted

## 2022-06-12 VITALS — Wt 136.2 lb

## 2022-06-12 DIAGNOSIS — I5022 Chronic systolic (congestive) heart failure: Secondary | ICD-10-CM

## 2022-06-12 NOTE — Progress Notes (Signed)
Daily Session Note  Patient Details  Name: Robin Ortiz MRN: [id number] Date of Birth: Feb 13, 1935 Referring Provider:   Flowsheet Row Pulmonary Rehab from 02/12/2022 in [facility] Cardiac and Pulmonary Rehab  Referring Provider Gollan       Encounter Date: 06/12/2022  Check In:  Session Check In - 06/12/22 1025       Check-In   Supervising physician immediately available to respond to emergencies See telemetry face sheet for immediately available ER MD    Location [facility]-Cardiac & Pulmonary Rehab    Staff Present Heath Lark, RN, BSN, CCRP;Noah Tickle, BS, Exercise Physiologist;Kara Eliezer Bottom, MS, ASCM CEP, Exercise Physiologist;Meredith Sherryll Burger, RN BSN    Virtual Visit No    Medication changes reported     No    Fall or balance concerns reported    No    Tobacco Cessation No Change    Warm-up and Cool-down Performed on first and last piece of equipment    Resistance Training Performed Yes    VAD Patient? No    PAD/SET Patient? No      Pain Assessment   Currently in Pain? No/denies    Multiple Pain Sites No              6 Minute Walk     Row Name 02/12/22 1512 06/12/22 1019       6 Minute Walk   Phase Initial Discharge    Distance 860 feet 1015 feet    Distance % Change -- 18 %    Distance Feet Change -- 155 ft    Walk Time 6 minutes 6 minutes    # of Rest Breaks 0 0    MPH 1.63 1.92    METS 1.48 1.59    RPE 13 11    Perceived Dyspnea  2 1    VO2 Peak 5.17 5.56    Symptoms Yes (comment) No    Comments left hip pain when walking 3/10 --    Resting HR 63 bpm 63 bpm    Resting BP 140/80 124/62    Resting Oxygen Saturation  98 % 95 %    Exercise Oxygen Saturation  during 6 min walk 95 % 88 %    Max Ex. HR 75 bpm 79 bpm    Max Ex. BP 166/88 128/74    2 Minute Post BP 158/84 126/70      Interval HR   1 Minute HR 70 68    2 Minute HR 75 75    3 Minute HR 75 74    4 Minute HR 75 76    5 Minute HR 75 77    6 Minute HR 76 79    2 Minute Post HR 59 72     Interval Heart Rate? Yes Yes      Interval Oxygen   Interval Oxygen? Yes Yes    Baseline Oxygen Saturation % 98 % 95 %    1 Minute Oxygen Saturation % 96 % 88 %    1 Minute Liters of Oxygen 0 L 0 L    2 Minute Oxygen Saturation % 95 % 89 %    2 Minute Liters of Oxygen 0 L 0 L    3 Minute Oxygen Saturation % 96 % 90 %    3 Minute Liters of Oxygen 0 L 0 L    4 Minute Oxygen Saturation % 97 % 93 %    4 Minute Liters of Oxygen 0 L 0 L  5 Minute Oxygen Saturation % 96 % 90 %    5 Minute Liters of Oxygen 0 L 0 L    6 Minute Oxygen Saturation % 96 % 93 %    6 Minute Liters of Oxygen 0 L 0 L    2 Minute Post Oxygen Saturation % 98 % 96 %    2 Minute Post Liters of Oxygen 0 L 0 L                Social History   Tobacco Use  Smoking Status Never  Smokeless Tobacco Never    Goals Met:  Proper associated with RPD/PD & O2 Sat Independence with exercise equipment Exercise tolerated well No report of concerns or symptoms today  Goals Unmet:  Not Applicable  Comments: Pt able to follow exercise prescription today without complaint.  Will continue to monitor for progression.    Dr. Emily Filbert is Medical Director for [place].  Dr. Ottie Glazier is Medical Director for [facility] Pulmonary Rehabilitation.

## 2022-06-12 NOTE — Patient Instructions (Signed)
Discharge Patient Instructions  Patient Details  Name: Robin Ortiz MRN: [id number] Date of Birth: May 16, 1935 Referring Provider:  Minna Merritts, MD   Number of Visits: 31  Reason for Discharge:  Patient reached a stable level of exercise. Patient independent in their exercise. Patient has met program and personal goals.  Diagnosis:  Heart failure, chronic systolic (HCC)  Initial Exercise Prescription:  Initial Exercise Prescription - 02/12/22 1500       Date of Initial Exercise RX and Referring Provider   Date 02/12/22    Referring Provider Gollan      Oxygen   Maintain Oxygen Saturation 88% or higher      Treadmill   MPH 0.8    Grade 0    Minutes 15    METs 1.48      NuStep   Level 1    SPM 80    Minutes 15    METs 1.48      Biostep-RELP   Level 1    SPM 50    Minutes 15    METs 1.48      Prescription Details   Frequency (times per week) 2    Duration Progress to 30 minutes of continuous aerobic without signs/symptoms of physical distress      Intensity   THRR 40-80% of Max Heartrate 92-121    Ratings of Perceived Exertion 11-13    Perceived Dyspnea 0-4      Progression   Progression Continue to progress workloads to maintain intensity without signs/symptoms of physical distress.      Resistance Training   Training Prescription Yes    Weight 2    Reps 10-15             Discharge Exercise Prescription (Final Exercise Prescription Changes):  Exercise Prescription Changes - 05/29/22 1400       Response to Exercise   Blood Pressure (Admit) 104/62    Blood Pressure (Exit) 118/60    Heart Rate (Admit) 63 bpm    Heart Rate (Exercise) 82 bpm    Heart Rate (Exit) 60 bpm    Oxygen Saturation (Admit) 97 %    Oxygen Saturation (Exercise) 88 %    Oxygen Saturation (Exit) 97 %    Rating of Perceived Exertion (Exercise) 14    Perceived Dyspnea (Exercise) 1    Symptoms none    Duration Continue with 30 min of aerobic exercise without  signs/symptoms of physical distress.    Intensity THRR unchanged      Progression   Progression Continue to progress workloads to maintain intensity without signs/symptoms of physical distress.    Average METs 2.08      Resistance Training   Training Prescription Yes    Weight 2 lb    Reps 10-15      Interval Training   Interval Training No      Treadmill   MPH 1.5    Grade 0    Minutes 15    METs 2.15      NuStep   Level 3    Minutes 15    METs 2.3      Biostep-RELP   Level 3    Minutes 15    METs 2      Home Exercise Plan   Plans to continue exercise at Home (comment)   walking at home outside or on TM and continue to do PT exercises.   Frequency Add 2 additional days to program exercise sessions.  Initial Home Exercises Provided 02/27/22      Oxygen   Maintain Oxygen Saturation 88% or higher             Functional Capacity:  6 Minute Walk     Row Name 02/12/22 1512 06/12/22 1019       6 Minute Walk   Phase Initial Discharge    Distance 860 feet 1015 feet    Distance % Change -- 18 %    Distance Feet Change -- 155 ft    Walk Time 6 minutes 6 minutes    # of Rest Breaks 0 0    MPH 1.63 1.92    METS 1.48 1.59    RPE 13 11    Perceived Dyspnea  2 1    VO2 Peak 5.17 5.56    Symptoms Yes (comment) No    Comments left hip pain when walking 3/10 --    Resting HR 63 bpm 63 bpm    Resting BP 140/80 124/62    Resting Oxygen Saturation  98 % 95 %    Exercise Oxygen Saturation  during 6 min walk 95 % 88 %    Max Ex. HR 75 bpm 79 bpm    Max Ex. BP 166/88 128/74    2 Minute Post BP 158/84 126/70      Interval HR   1 Minute HR 70 68    2 Minute HR 75 75    3 Minute HR 75 74    4 Minute HR 75 76    5 Minute HR 75 77    6 Minute HR 76 79    2 Minute Post HR 59 72    Interval Heart Rate? Yes Yes      Interval Oxygen   Interval Oxygen? Yes Yes    Baseline Oxygen Saturation % 98 % 95 %    1 Minute Oxygen Saturation % 96 % 88 %    1 Minute Liters  of Oxygen 0 L 0 L    2 Minute Oxygen Saturation % 95 % 89 %    2 Minute Liters of Oxygen 0 L 0 L    3 Minute Oxygen Saturation % 96 % 90 %    3 Minute Liters of Oxygen 0 L 0 L    4 Minute Oxygen Saturation % 97 % 93 %    4 Minute Liters of Oxygen 0 L 0 L    5 Minute Oxygen Saturation % 96 % 90 %    5 Minute Liters of Oxygen 0 L 0 L    6 Minute Oxygen Saturation % 96 % 93 %    6 Minute Liters of Oxygen 0 L 0 L    2 Minute Post Oxygen Saturation % 98 % 96 %    2 Minute Post Liters of Oxygen 0 L 0 L             Nutrition & Weight - Outcomes:  Pre Biometrics - 02/12/22 1520       Pre Biometrics   Height 5' 5" (1.651 m)    Weight 143 lb 11.2 oz (65.2 kg)    BMI (Calculated) 23.91    Single Leg Stand 2.1 seconds             Post Biometrics - 06/12/22 1023        Post  Biometrics   Weight 136 lb 3.2 oz (61.8 kg)    BMI (Calculated) 22.66    Single Leg  Stand 2.73 seconds             Nutrition:  Nutrition Therapy & Goals - 02/22/22 1215       Nutrition Therapy   Diet Heart healthy, low Na    Drug/Food Interactions Statins/Certain Fruits    Protein (specify units) 60-65g    Fiber 21 grams    Whole Grain Foods 3 servings    Saturated Fats 12 max. grams    Fruits and Vegetables 8 servings/day    Sodium 2 grams      Personal Nutrition Goals   Nutrition Goal ST: add protein/fat to snacks (peanut butter, cottage cheese, cheese cubes), try protein ball recipe LT: Maintain weight, meet calorie/protein needs    Comments 87 y.o. F admitted to rehab for chronic systolic heart failure. PMHx includes HTN, HLD, osteoporosis, NICM, anxiety, cardiomegaly, chronic fatigue. Relevant medications includes Ca-vit D, lasix, K+, MVI, evesta, zocor. PYP Score: 61. Vegetables & Fruits 8/12. Breads, Grains & Cereals 5/12. Red & Processed Meat 8/12. Poultry 2/2. Fish & Shellfish 1/4. Beans, Nuts & Seeds 2/4. Milk & Dairy Foods 3/6. Toppings, Oils, Seasonings & Salt 12/20. Sweets, Snacks  & Restaurant Food 11/14. Beverages 9/10.  Dahiana tries to eat smaller, more frequent meals as her appetite has been lower. She reports her doctor told her not to lose anymore weight. She enjoys fruit and vegetables and keeps starchy vegetables and grains to a minimum on her plate. She enjoys chicken as her primary source of protein and is open to including other sources of protein for snacks such as peanut butter, cheese, and cottage cheese to add with her fruit to bump up the nutrition. She uses olive oil and some butter for cooking and she does not salt her food. For breakfast she enjoys having a bar or cream of wehat or oatmeal - encouraged her to have oatmeal more often and bulking up it with fruit like berries, and nuts/seeds. She enjoys sourdough bread, but does not eat it frequently. She used to enjoy sweets more, but has not had as much of a taste for them - discussed another snack option such as energy balls that include nut/seed butter, oats, some liquid sweetener or water to add mositure, and add ins like dried friet, chocolate chips, or cocoa powder - she was interested in this. She limits her fluid, but water is her drink of choice. Discussed general heart healthy eating.      Intervention Plan   Intervention Prescribe, educate and counsel regarding individualized specific dietary modifications aiming towards targeted core components such as weight, hypertension, lipid management, diabetes, heart failure and other comorbidities.    Expected Outcomes Short Term Goal: Understand basic principles of dietary content, such as calories, fat, sodium, cholesterol and nutrients.;Short Term Goal: A plan has been developed with personal nutrition goals set during dietitian appointment.;Long Term Goal: Adherence to prescribed nutrition plan.

## 2022-06-14 ENCOUNTER — Encounter: Payer: Medicare Other | Admitting: *Deleted

## 2022-06-14 DIAGNOSIS — I5022 Chronic systolic (congestive) heart failure: Secondary | ICD-10-CM

## 2022-06-14 NOTE — Progress Notes (Signed)
Daily Session Note  Patient Details  Name: Robin Ortiz MRN: [id number] Date of Birth: 28-Mar-1935 Referring Provider:   Flowsheet Row Pulmonary Rehab from 02/12/2022 in [facility] Cardiac and Pulmonary Rehab  Referring Provider Gollan       Encounter Date: 06/14/2022  Check In:  Session Check In - 06/14/22 1247       Check-In   Supervising physician immediately available to respond to emergencies See telemetry face sheet for immediately available ER MD    Location [facility]-Cardiac & Pulmonary Rehab    Staff Present Heath Lark, RN, BSN, CCRP;Joseph [place], RCP,RRT,BSRT;Kelly Carnot-Moon, BS, ACSM CEP, Exercise Physiologist;Other   Darlyne Russian RN ,[place]   Virtual Visit No    Medication changes reported     No    Fall or balance concerns reported    No    Warm-up and Cool-down Performed on first and last piece of equipment    Resistance Training Performed Yes    VAD Patient? No    PAD/SET Patient? No      Pain Assessment   Currently in Pain? No/denies                Social History   Tobacco Use  Smoking Status Never  Smokeless Tobacco Never    Goals Met:  Proper associated with RPD/PD & O2 Sat Independence with exercise equipment Exercise tolerated well No report of concerns or symptoms today  Goals Unmet:  Not Applicable  Comments: Pt able to follow exercise prescription today without complaint.  Will continue to monitor for progression.    Dr. Emily Filbert is Medical Director for [place].  Dr. Ottie Glazier is Medical Director for [facility] Pulmonary Rehabilitation.

## 2022-06-15 ENCOUNTER — Other Ambulatory Visit
Admission: RE | Admit: 2022-06-15 | Discharge: 2022-06-15 | Disposition: A | Discharge status: Home / Self Care | Payer: Medicare Other | Attending: Cardiovascular Disease | Admitting: Cardiovascular Disease

## 2022-06-15 DIAGNOSIS — Z79899 Other long term (current) drug therapy: Secondary | ICD-10-CM | POA: Diagnosis present

## 2022-06-15 DIAGNOSIS — I428 Other cardiomyopathies: Secondary | ICD-10-CM | POA: Diagnosis present

## 2022-06-15 DIAGNOSIS — I5022 Chronic systolic (congestive) heart failure: Secondary | ICD-10-CM | POA: Diagnosis present

## 2022-06-15 LAB — BASIC METABOLIC PANEL
Anion gap: 8 (ref 5–15)
BUN: 33 mg/dL — ABNORMAL HIGH (ref 8–23)
CO2: 28 mmol/L (ref 22–32)
Calcium: 10.2 mg/dL (ref 8.9–10.3)
Chloride: 103 mmol/L (ref 98–111)
Creatinine, Ser: 1.47 mg/dL — ABNORMAL HIGH (ref 0.44–1.00)
GFR, Estimated: 35 mL/min — ABNORMAL LOW (ref >60)
Glucose, Bld: 107 mg/dL — ABNORMAL HIGH (ref 70–99)
Potassium: 4.3 mmol/L (ref 3.5–5.1)
Sodium: 139 mmol/L (ref 135–145)

## 2022-06-19 ENCOUNTER — Encounter: Payer: Medicare Other | Admitting: *Deleted

## 2022-06-19 DIAGNOSIS — I5022 Chronic systolic (congestive) heart failure: Secondary | ICD-10-CM

## 2022-06-19 NOTE — Progress Notes (Signed)
Daily Session Note  Patient Details  Name: Robin Ortiz MRN: [id number] Date of Birth: 1935/04/01 Referring Provider:   Flowsheet Row Pulmonary Rehab from 02/12/2022 in [facility] Cardiac and Pulmonary Rehab  Referring Provider Gollan       Encounter Date: 06/19/2022  Check In:  Session Check In - 06/19/22 1115       Check-In   Supervising physician immediately available to respond to emergencies See telemetry face sheet for immediately available ER MD    Location [facility]-Cardiac & Pulmonary Rehab    Staff Present Heath Lark, RN, BSN, CCRP;Jessica [place], MA, RCEP, CCRP, CCET;Noah Tickle, BS, Exercise Physiologist;Meredith Sherryll Burger, RN BSN    Virtual Visit No    Medication changes reported     No    Fall or balance concerns reported    No    Warm-up and Cool-down Performed on first and last piece of equipment    Resistance Training Performed Yes    VAD Patient? No    PAD/SET Patient? No      Pain Assessment   Currently in Pain? No/denies                Social History   Tobacco Use  Smoking Status Never  Smokeless Tobacco Never    Goals Met:  Proper associated with RPD/PD & O2 Sat Independence with exercise equipment Exercise tolerated well No report of concerns or symptoms today  Goals Unmet:  Not Applicable  Comments: Pt able to follow exercise prescription today without complaint.  Will continue to monitor for progression.    Dr. Emily Filbert is Medical Director for [place].  Dr. Ottie Glazier is Medical Director for [facility] Pulmonary Rehabilitation.

## 2022-06-21 ENCOUNTER — Encounter: Payer: Medicare Other | Admitting: *Deleted

## 2022-06-21 DIAGNOSIS — I5022 Chronic systolic (congestive) heart failure: Secondary | ICD-10-CM

## 2022-06-21 NOTE — Progress Notes (Signed)
Daily Session Note  Patient Details  Name: Robin Ortiz MRN: [id number] Date of Birth: 1934-11-24 Referring Provider:   Flowsheet Row Pulmonary Rehab from 02/12/2022 in [facility] Cardiac and Pulmonary Rehab  Referring Provider Gollan       Encounter Date: 06/21/2022  Check In:  Session Check In - 06/21/22 1435       Check-In   Supervising physician immediately available to respond to emergencies See telemetry face sheet for immediately available ER MD    Location [facility]-Cardiac & Pulmonary Rehab    Staff Present Other;Joseph Tessie Fass, Lorre Nick, MA, [place], CCRP, CCET   Darlyne Russian, RN   Virtual Visit No    Medication changes reported     No    Fall or balance concerns reported    No    Tobacco Cessation No Change    Warm-up and Cool-down Performed on first and last piece of equipment    Resistance Training Performed Yes    VAD Patient? No    PAD/SET Patient? No      Pain Assessment   Currently in Pain? No/denies                Social History   Tobacco Use  Smoking Status Never  Smokeless Tobacco Never    Goals Met:  Independence with exercise equipment Exercise tolerated well No report of concerns or symptoms today Strength training completed today  Goals Unmet:  Not Applicable  Comments: Pt able to follow exercise prescription today without complaint.  Will continue to monitor for progression.    Dr. Emily Filbert is Medical Director for [place].  Dr. Ottie Glazier is Medical Director for [facility] Pulmonary Rehabilitation.

## 2022-06-25 ENCOUNTER — Telehealth: Payer: Self-pay | Admitting: Cardiovascular Disease

## 2022-06-25 MED ORDER — FUROSEMIDE 40 MG PO TABS: ORAL_TABLET | ORAL | 3 refills | Status: DC

## 2022-06-25 NOTE — Telephone Encounter (Signed)
I spoke with the patient regarding her lab results and Dr. Donivan Scull recommendations to: 1) decrease lasix 20 mg to 2 times per week 2) increase fluids slightly  The patient voices understanding of these results and recommendations and is agreeable.  She did also provide me with BP (HR) readings per Dr. Gwenyth Ober request from her OV on 05/22/22 after starting spironolactone 25 mg once daily.  9/16: AM- 217/65 (59)  PM- 141/77 (61) 9/17: AM- 154/79 (60)  PM- 142/82 (63) 9/18: AM- 144/84 (60)  PM- 138/76 (59) 9/19: AM- 130/70 (51)  PM- 133/72 (55) 9/20: AM- 114/67 (63)  PM- 113/61 (56) 9/21: AM- 124/58 (?)    PM- 135/70 (31) 9/22: AM- 117/66 (65)  PM- 111/63 (63) 9/23: AM- 109/64 (50)  PM- 138/72 (63) 9/24: AM- 105/62 (53)  PM- no reading 9/25: AM- 95/56 (59)   I advised the patient that her BP readings are progressively going down. She denies any dizziness/ lightheadedness today. She did advise that last week (~ 9/17) she did not feel "quite right." She was supposed to stay for a shower after church, but ended up going home to rest. She denies any symptoms since that time.  The patient confirms she took her lasix today (typically on M/W/F). I have asked her to not take her lasix again until Friday. She is encouraged to increase her fluids slightly.  I have advised her I will forward her BP/ HR readings to Dr. Rockey Situ to review and advise further if needed. She will continue to monitor her BP/ HR readings. She is aware if readings continue to run low, we may need to cut back on her spironolactone/ entresto doses.  The patient again voiced understanding and was appreciative of the call today.

## 2022-06-25 NOTE — Telephone Encounter (Signed)
Robin Merritts, MD  06/23/2022 11:21 AM EDT     BMP Slight climb in renal function with Lasix 3 times a week and spironolactone daily Would recommend decreasing Lasix down to twice a week Increase fluids slightly

## 2022-06-26 ENCOUNTER — Encounter: Payer: Medicare Other | Admitting: *Deleted

## 2022-06-26 DIAGNOSIS — I5022 Chronic systolic (congestive) heart failure: Secondary | ICD-10-CM | POA: Diagnosis not present

## 2022-06-26 NOTE — Progress Notes (Signed)
Daily Session Note  Patient Details  Name: Beatryce C Martinique MRN: [id number] Date of Birth: 1934/11/11 Referring Provider:   Flowsheet Row Pulmonary Rehab from 02/12/2022 in [facility] Cardiac and Pulmonary Rehab  Referring Provider Gollan       Encounter Date: 06/26/2022  Check In:  Session Check In - 06/26/22 1045       Check-In   Supervising physician immediately available to respond to emergencies See telemetry face sheet for immediately available ER MD    Location [facility]-Cardiac & Pulmonary Rehab    Staff Present Heath Lark, RN, BSN, CCRP;Noah Tickle, BS, Exercise Physiologist;Jessica [place], MA, RCEP, CCRP, CCET;Meredith [place], RN BSN    Virtual Visit No    Medication changes reported     No    Fall or balance concerns reported    No    Warm-up and Cool-down Performed on first and last piece of Teacher, music Performed Yes    VAD Patient? No    PAD/SET Patient? No      Pain Assessment   Currently in Pain? No/denies                Social History   Tobacco Use  Smoking Status Never  Smokeless Tobacco Never    Goals Met:  Proper associated with RPD/PD & O2 Sat Independence with exercise equipment Exercise tolerated well No report of concerns or symptoms today  Goals Unmet:  Not Applicable  Comments: Pt able to follow exercise prescription today without complaint.  Will continue to monitor for progression.    Dr. Emily Filbert is Medical Director for [place].  Dr. Ottie Glazier is Medical Director for [facility] Pulmonary Rehabilitation.

## 2022-06-28 ENCOUNTER — Encounter: Payer: Medicare Other | Admitting: *Deleted

## 2022-06-28 DIAGNOSIS — I5022 Chronic systolic (congestive) heart failure: Secondary | ICD-10-CM

## 2022-06-28 NOTE — Progress Notes (Signed)
Discharge Summary: Robin Ortiz (DOB 2035-02-09)   [name] graduated today from  rehab with 36 sessions completed.  Details of the patient's exercise prescription and what She needs to do in order to continue the prescription and progress were discussed with patient.  Patient was given a copy of prescription and goals.  Patient verbalized understanding.  [name] plans to continue to exercise by walking and continuing home PT exercises.   [name] 02/12/22 1512 06/12/22 1019       6 Minute Walk   Phase Initial Discharge    Distance 860 feet 1015 feet    Distance % Change -- 18 %    Distance Feet Change -- 155 ft    Walk Time 6 minutes 6 minutes    # of Rest Breaks 0 0    MPH 1.63 1.92    METS 1.48 1.59    RPE 13 11    Perceived Dyspnea  2 1    VO2 Peak 5.17 5.56    Symptoms Yes (comment) No    Comments left hip pain when walking 3/10 --    Resting HR 63 bpm 63 bpm    Resting BP 140/80 124/62    Resting Oxygen Saturation  98 % 95 %    Exercise Oxygen Saturation  during 6 min walk 95 % 88 %    Max Ex. HR 75 bpm 79 bpm    Max Ex. BP 166/88 128/74    2 Minute Post BP 158/84 126/70      Interval HR   1 Minute HR 70 68    2 Minute HR 75 75    3 Minute HR 75 74    4 Minute HR 75 76    5 Minute HR 75 77    6 Minute HR 76 79    2 Minute Post HR 59 72    Interval Heart Rate? Yes Yes      Interval Oxygen   Interval Oxygen? Yes Yes    Baseline Oxygen Saturation % 98 % 95 %    1 Minute Oxygen Saturation % 96 % 88 %    1 Minute Liters of Oxygen 0 L 0 L    2 Minute Oxygen Saturation % 95 % 89 %    2 Minute Liters of Oxygen 0 L 0 L    3 Minute Oxygen Saturation % 96 % 90 %    3 Minute Liters of Oxygen 0 L 0 L    4 Minute Oxygen Saturation % 97 % 93 %    4 Minute Liters of Oxygen 0 L 0 L    5 Minute Oxygen Saturation % 96 % 90 %    5 Minute Liters of Oxygen 0 L 0 L    6 Minute Oxygen Saturation % 96 % 93 %    6 Minute Liters of Oxygen 0 L 0 L    2 Minute  Post Oxygen Saturation % 98 % 96 %    2 Minute Post Liters of Oxygen 0 L 0 L

## 2022-06-28 NOTE — Progress Notes (Signed)
Pulmonary Individual Treatment Plan  Patient Details  Name: Robin Ortiz MRN: [id number] Date of Birth: 22-Aug-1935 Referring Provider:   Flowsheet Row Pulmonary Rehab from 02/12/2022 in [facility] Cardiac and Pulmonary Rehab  Referring Provider Gollan       Initial Encounter Date:  Flowsheet Row Pulmonary Rehab from 02/12/2022 in [facility] Cardiac and Pulmonary Rehab  Date 02/12/22       Visit Diagnosis: Heart failure, chronic systolic ([place])  Patient's Home Medications on Admission:  Current Outpatient Medications:    apixaban (ELIQUIS) 5 MG TABS tablet, Take 1 tablet (5 mg total) by mouth 2 (two) times daily., Disp: 60 tablet, Rfl: 5   Calcium-Vitamin D 600-200 MG-UNIT per tablet, Take 1 tablet by mouth daily., Disp: , Rfl:    celecoxib (CELEBREX) 200 MG capsule, Take 200 mg by mouth daily as needed for mild pain., Disp: , Rfl:    diclofenac Sodium (VOLTAREN) 1 % GEL, Apply 2 g topically daily as needed (Arthritis)., Disp: , Rfl:    diphenhydrAMINE-APAP, sleep, (TYLENOL PM EXTRA STRENGTH PO), Take 1 tablet by mouth at bedtime as needed (sleep). (Patient not taking: Reported on 04/30/2022), Disp: , Rfl:    furosemide (LASIX) 40 MG tablet, Take 0.5 tablet (20 mg) by mouth on Mondays & Fridays., Disp: 30 tablet, Rfl: 3   Misc Natural Products (OSTEO BI-FLEX ADV JOINT SHIELD PO), Take 1 tablet by mouth daily., Disp: , Rfl:    Multiple Vitamin (MULTIVITAMIN) tablet, Take 1 tablet by mouth daily. Alive, Disp: , Rfl:    raloxifene (EVISTA) 60 MG tablet, Take 60 mg by mouth daily. (Patient not taking: Reported on 05/14/2022), Disp: , Rfl:    sacubitril-valsartan (ENTRESTO) 97-103 MG, Take 1 tablet by mouth 2 (two) times daily., Disp: 60 tablet, Rfl: 3   simvastatin (ZOCOR) 20 MG tablet, Take 20 mg by mouth at bedtime., Disp: , Rfl:    spironolactone (ALDACTONE) 25 MG tablet, Take 1 tablet (25 mg total) by mouth daily., Disp: 90 tablet, Rfl: 3  Past Medical History: Past Medical History:   Diagnosis Date   Anxiety    Cardiomegaly    Chronic fatigue    Chronic HFmrEF (heart failure with midrange ejection fraction) ([place])    a. 2002 Echo: EF 20-30%; b. 09/2015 Echo: EF 45-50%, diff HK. Mild MR. Mod dil LA; c. 10/2021 Echo: EF 35-40% (in setting of afib), no rwma, low-nl RV fxn, sev dil LA, mildly dil RA, mod MR, mild-mod TR.   Essential hypertension    Mixed hyperlipidemia    NICM (nonischemic cardiomyopathy) (HCC)    Osteoporosis    Persistent atrial fibrillation ([place])    a. Dx 10/2021. CHA2DS2VASc = 5-->Eliquis; b. 11/2021 s/p DCCV (120J).    Tobacco Use: Social History   Tobacco Use  Smoking Status Never  Smokeless Tobacco Never    Labs: Review Flowsheet        No data to display           Pulmonary Assessment Scores:  Pulmonary Assessment Scores     Row Name 02/12/22 1526 06/19/22 1116       ADL UCSD   ADL Phase Entry --    SOB Score total 53 27    Rest 3 1    Walk 2 1    Stairs 3 1    Bath 2 0    Dress 2 1    Shop 2 1      CAT Score   CAT Score 14 8  mMRC Score   mMRC Score 2 --             UCSD: Self-administered rating of dyspnea associated with activities of daily living (ADLs) 6-point scale (0 = "not at all" to 5 = "maximal or unable to do because of breathlessness")  Scoring Scores range from 0 to 120.  Minimally important difference is 5 units  CAT: CAT can identify the health impairment of COPD patients and is better correlated with disease progression.  CAT has a scoring range of zero to 40. The CAT score is classified into four groups of low (less than 10), medium (10 - 20), high (21-30) and very high (31-40) based on the impact level of disease on health status. A CAT score over 10 suggests significant symptoms.  A worsening CAT score could be explained by an exacerbation, poor medication adherence, poor inhaler technique, or progression of COPD or comorbid conditions.  CAT MCID is 2 points  mMRC: mMRC (Modified  Medical Research Council) Dyspnea Scale is used to assess the degree of baseline functional disability in patients of respiratory disease due to dyspnea. No minimal important difference is established. A decrease in score of 1 point or greater is considered a positive change.   Pulmonary Function Assessment:  Pulmonary Function Assessment - 02/01/22 0909       Breath   Shortness of Breath No             Exercise Target Goals: Exercise Program Goal: Individual exercise prescription set using results from initial 6 min walk test and THRR while considering  patient's activity barriers and safety.   Exercise Prescription Goal: Initial exercise prescription builds to 30-45 minutes a day of aerobic activity, 2-3 days per week.  Home exercise guidelines will be given to patient during program as part of exercise prescription that the participant will acknowledge.  Education: Aerobic Exercise: - Group verbal and visual presentation on the components of exercise prescription. Introduces F.I.T.T principle from ACSM for exercise prescriptions.  Reviews F.I.T.T. principles of aerobic exercise including progression. Written material given at graduation. Flowsheet Row Pulmonary Rehab from 05/10/2022 in [facility] Cardiac and Pulmonary Rehab  Education need identified 02/12/22       Education: Resistance Exercise: - Group verbal and visual presentation on the components of exercise prescription. Introduces F.I.T.T principle from ACSM for exercise prescriptions  Reviews F.I.T.T. principles of resistance exercise including progression. Written material given at graduation.    Education: Exercise & Equipment Safety: - Individual verbal instruction and demonstration of equipment use and safety with use of the equipment. Flowsheet Row Pulmonary Rehab from 05/10/2022 in [facility] Cardiac and Pulmonary Rehab  Date 02/12/22  Educator [facility]  Instruction Review Code 1- Verbalizes Understanding       Education:  Exercise Physiology & General Exercise Guidelines: - Group verbal and written instruction with models to review the exercise physiology of the cardiovascular system and associated critical values. Provides general exercise guidelines with specific guidelines to those with heart or lung disease.    Education: Flexibility, Balance, Mind/Body Relaxation: - Group verbal and visual presentation with interactive activity on the components of exercise prescription. Introduces F.I.T.T principle from ACSM for exercise prescriptions. Reviews F.I.T.T. principles of flexibility and balance exercise training including progression. Also discusses the mind body connection.  Reviews various relaxation techniques to help reduce and manage stress (i.e. Deep breathing, progressive muscle relaxation, and visualization). Balance handout provided to take home. Written material given at graduation. Flowsheet Row Pulmonary Rehab from 05/10/2022 in  [place] Cardiac and Pulmonary Rehab  Date 02/15/22  Educator [facility]  Instruction Review Code 1- Verbalizes Understanding       Activity Barriers & Risk Stratification:   6 Minute Walk:  6 Minute Walk     Row Name 02/12/22 1512 06/12/22 1019       6 Minute Walk   Phase Initial Discharge    Distance 860 feet 1015 feet    Distance % Change -- 18 %    Distance Feet Change -- 155 ft    Walk Time 6 minutes 6 minutes    # of Rest Breaks 0 0    MPH 1.63 1.92    METS 1.48 1.59    RPE 13 11    Perceived Dyspnea  2 1    VO2 Peak 5.17 5.56    Symptoms Yes (comment) No    Comments left hip pain when walking 3/10 --    Resting HR 63 bpm 63 bpm    Resting BP 140/80 124/62    Resting Oxygen Saturation  98 % 95 %    Exercise Oxygen Saturation  during 6 min walk 95 % 88 %    Max Ex. HR 75 bpm 79 bpm    Max Ex. BP 166/88 128/74    2 Minute Post BP 158/84 126/70      Interval HR   1 Minute HR 70 68    2 Minute HR 75 75    3 Minute HR 75 74    4 Minute HR 75 76    5 Minute HR  75 77    6 Minute HR 76 79    2 Minute Post HR 59 72    Interval Heart Rate? Yes Yes      Interval Oxygen   Interval Oxygen? Yes Yes    Baseline Oxygen Saturation % 98 % 95 %    1 Minute Oxygen Saturation % 96 % 88 %    1 Minute Liters of Oxygen 0 L 0 L    2 Minute Oxygen Saturation % 95 % 89 %    2 Minute Liters of Oxygen 0 L 0 L    3 Minute Oxygen Saturation % 96 % 90 %    3 Minute Liters of Oxygen 0 L 0 L    4 Minute Oxygen Saturation % 97 % 93 %    4 Minute Liters of Oxygen 0 L 0 L    5 Minute Oxygen Saturation % 96 % 90 %    5 Minute Liters of Oxygen 0 L 0 L    6 Minute Oxygen Saturation % 96 % 93 %    6 Minute Liters of Oxygen 0 L 0 L    2 Minute Post Oxygen Saturation % 98 % 96 %    2 Minute Post Liters of Oxygen 0 L 0 L            Oxygen Initial Assessment:  Oxygen Initial Assessment - 02/12/22 1649       Home Oxygen   Home Oxygen Device None    Sleep Oxygen Prescription None    Home Exercise Oxygen Prescription None    Home Resting Oxygen Prescription None    Compliance with Home Oxygen Use Yes      Initial 6 min Walk   Oxygen Used None      Program Oxygen Prescription   Program Oxygen Prescription None      Intervention   Short Term Goals To learn  and understand importance of monitoring SPO2 with pulse oximeter and demonstrate accurate use of the pulse oximeter.;To learn and understand importance of maintaining oxygen saturations>88%;To learn and demonstrate proper pursed lip breathing techniques or other breathing techniques.     Long  Term Goals Maintenance of O2 saturations>88%;Exhibits proper breathing techniques, such as pursed lip breathing or other method taught during program session;Verbalizes importance of monitoring SPO2 with pulse oximeter and return demonstration             Oxygen Re-Evaluation:  Oxygen Re-Evaluation     Row Name 02/13/22 0953 02/27/22 1017 04/05/22 1017 04/12/22 1010 05/17/22 1001     Program Oxygen Prescription    Program Oxygen Prescription None None None None None     Home Oxygen   Home Oxygen Device None None None None None   Sleep Oxygen Prescription None None None None None   Home Exercise Oxygen Prescription None None None None None   Home Resting Oxygen Prescription None None None None None   Compliance with Home Oxygen Use Yes Yes Yes Yes Yes     Goals/Expected Outcomes   Short Term Goals To learn and understand importance of monitoring SPO2 with pulse oximeter and demonstrate accurate use of the pulse oximeter.;To learn and understand importance of maintaining oxygen saturations>88%;To learn and demonstrate proper pursed lip breathing techniques or other breathing techniques.  To learn and understand importance of monitoring SPO2 with pulse oximeter and demonstrate accurate use of the pulse oximeter.;To learn and understand importance of maintaining oxygen saturations>88%;To learn and demonstrate proper pursed lip breathing techniques or other breathing techniques.  To learn and understand importance of monitoring SPO2 with pulse oximeter and demonstrate accurate use of the pulse oximeter.;To learn and understand importance of maintaining oxygen saturations>88%;To learn and demonstrate proper pursed lip breathing techniques or other breathing techniques.  To learn and understand importance of monitoring SPO2 with pulse oximeter and demonstrate accurate use of the pulse oximeter.;To learn and understand importance of maintaining oxygen saturations>88%;To learn and demonstrate proper pursed lip breathing techniques or other breathing techniques.  To learn and understand importance of monitoring SPO2 with pulse oximeter and demonstrate accurate use of the pulse oximeter.;To learn and understand importance of maintaining oxygen saturations>88%;To learn and demonstrate proper pursed lip breathing techniques or other breathing techniques.    Long  Term Goals Maintenance of O2 saturations>88%;Exhibits proper  breathing techniques, such as pursed lip breathing or other method taught during program session;Verbalizes importance of monitoring SPO2 with pulse oximeter and return demonstration Maintenance of O2 saturations>88%;Exhibits proper breathing techniques, such as pursed lip breathing or other method taught during program session;Verbalizes importance of monitoring SPO2 with pulse oximeter and return demonstration Maintenance of O2 saturations>88%;Exhibits proper breathing techniques, such as pursed lip breathing or other method taught during program session;Verbalizes importance of monitoring SPO2 with pulse oximeter and return demonstration Maintenance of O2 saturations>88%;Exhibits proper breathing techniques, such as pursed lip breathing or other method taught during program session;Verbalizes importance of monitoring SPO2 with pulse oximeter and return demonstration Maintenance of O2 saturations>88%;Exhibits proper breathing techniques, such as pursed lip breathing or other method taught during program session;Verbalizes importance of monitoring SPO2 with pulse oximeter and return demonstration   Comments Reviewed PLB technique with pt.  Talked about how it works and it's importance in maintaining their exercise saturations. Patient reports using PLB to help control SOB during exercise. Patient was encouraged to monitor SaO2 during exercise while exercising at home. Patient reports continuing to use PLB to help  control shortness of breath during exercise and reports using it frequently. Patient was encouraged to monitor O2 during exercise while exercising at home. Patient reports continuing to use PLB to help control shortness of breath during exercise and reports using it frequently, she will take breaks as needed as well while doing her ADLs. She rpeorts having a pulse oximeter at home to use. Robin Ortiz reports that her weight stable has been stable. She reports thats she has been short of breath, but will  pause to catch her breath; this doesn't happen frequently. She reports continuing to use PLB to help control shortness of breath during exercise and reports using it frequently. She has a pulse oximeter at home as well, she does not wear it; encouraged her to check her O2 when exercising.  When she is doing her activities of daily living sometimes she will have to pause or break due to fatigue.   Goals/Expected Outcomes Short: Become more profiecient at using PLB.   Long: Become independent at using PLB. Short: Continue to use PBL and start monitoring SaO2 at home during exercise. Long: Become independent with PLB and exercise program. Short: Continue to use PBL and start monitoring O2 at home during exercise. Long: Become independent with PLB and exercise program. Short: Continue to use PBL and start monitoring O2 at home during exercise. Long: Become independent with PLB and exercise program. Short: Continue to use PBL and start monitoring O2 at home during exercise. Long: Become independent with PLB and exercise program.    Row Name 06/19/22 1034             Program Oxygen Prescription   Program Oxygen Prescription None         Home Oxygen   Home Oxygen Device None       Sleep Oxygen Prescription None       Home Exercise Oxygen Prescription None       Home Resting Oxygen Prescription None       Compliance with Home Oxygen Use Yes         Goals/Expected Outcomes   Short Term Goals To learn and understand importance of monitoring SPO2 with pulse oximeter and demonstrate accurate use of the pulse oximeter.;To learn and understand importance of maintaining oxygen saturations>88%;To learn and demonstrate proper pursed lip breathing techniques or other breathing techniques.        Long  Term Goals Maintenance of O2 saturations>88%;Exhibits proper breathing techniques, such as pursed lip breathing or other method taught during program session;Verbalizes importance of monitoring SPO2 with pulse  oximeter and return demonstration       Comments She reports continuing to use PLB to help control shortness of breath during exercise and reports using it frequently. She has a pulse oximeter at home as well, she was encouraged to check her O2 when exercising.  When she is doing her activities of daily living sometimes she will have to pause or break due to fatigue.       Goals/Expected Outcomes Short: Continue to use PLB and start monitoring O2 at home during exercise. Long: Become independent with PLB and exercise program.                Oxygen Discharge (Final Oxygen Re-Evaluation):  Oxygen Re-Evaluation - 06/19/22 1034       Program Oxygen Prescription   Program Oxygen Prescription None      Home Oxygen   Home Oxygen Device None    Sleep Oxygen Prescription None  Home Exercise Oxygen Prescription None    Home Resting Oxygen Prescription None    Compliance with Home Oxygen Use Yes      Goals/Expected Outcomes   Short Term Goals To learn and understand importance of monitoring SPO2 with pulse oximeter and demonstrate accurate use of the pulse oximeter.;To learn and understand importance of maintaining oxygen saturations>88%;To learn and demonstrate proper pursed lip breathing techniques or other breathing techniques.     Long  Term Goals Maintenance of O2 saturations>88%;Exhibits proper breathing techniques, such as pursed lip breathing or other method taught during program session;Verbalizes importance of monitoring SPO2 with pulse oximeter and return demonstration    Comments She reports continuing to use PLB to help control shortness of breath during exercise and reports using it frequently. She has a pulse oximeter at home as well, she was encouraged to check her O2 when exercising.  When she is doing her activities of daily living sometimes she will have to pause or break due to fatigue.    Goals/Expected Outcomes Short: Continue to use PLB and start monitoring O2 at home during  exercise. Long: Become independent with PLB and exercise program.             Initial Exercise Prescription:  Initial Exercise Prescription - 02/12/22 1500       Date of Initial Exercise RX and Referring Provider   Date 02/12/22    Referring Provider Gollan      Oxygen   Maintain Oxygen Saturation 88% or higher      Treadmill   MPH 0.8    Grade 0    Minutes 15    METs 1.48      NuStep   Level 1    SPM 80    Minutes 15    METs 1.48      Biostep-RELP   Level 1    SPM 50    Minutes 15    METs 1.48      Prescription Details   Frequency (times per week) 2    Duration Progress to 30 minutes of continuous aerobic without signs/symptoms of physical distress      Intensity   THRR 40-80% of Max Heartrate 92-121    Ratings of Perceived Exertion 11-13    Perceived Dyspnea 0-4      Progression   Progression Continue to progress workloads to maintain intensity without signs/symptoms of physical distress.      Resistance Training   Training Prescription Yes    Weight 2    Reps 10-15             Perform Capillary Blood Glucose checks as needed.  Exercise Prescription Changes:   Exercise Prescription Changes     Row Name 02/12/22 1500 02/22/22 1200 02/27/22 1000 03/07/22 0800 03/19/22 1600     Response to Exercise   Blood Pressure (Admit) 140/80 124/62 -- 120/58 148/76   Blood Pressure (Exercise) 166/88 -- -- 160/78 136/78   Blood Pressure (Exit) 120/60 132/68 -- 90/56 146/68   Heart Rate (Admit) 63 bpm 62 bpm -- 50 bpm 52 bpm   Heart Rate (Exercise) 76 bpm 65 bpm -- 83 bpm 69 bpm   Heart Rate (Exit) 49 bpm 60 bpm -- 60 bpm 52 bpm   Oxygen Saturation (Admit) 98 % 97 % -- 96 % 98 %   Oxygen Saturation (Exercise) 95 % 94 % -- 84 % 91 %   Oxygen Saturation (Exit) 96 % 97 % -- 98 % 97 %  Rating of Perceived Exertion (Exercise) 13 15 -- 13 13   Perceived Dyspnea (Exercise) 2 2 -- 1 0   Symptoms 3/10 hip pain with walking SOB -- none none   Comments 6 MWT  results -- -- -- --   Duration -- Progress to 30 minutes of  aerobic without signs/symptoms of physical distress Progress to 30 minutes of  aerobic without signs/symptoms of physical distress Progress to 30 minutes of  aerobic without signs/symptoms of physical distress Continue with 30 min of aerobic exercise without signs/symptoms of physical distress.   Intensity -- THRR unchanged THRR unchanged THRR unchanged THRR unchanged     Progression   Progression -- Continue to progress workloads to maintain intensity without signs/symptoms of physical distress. Continue to progress workloads to maintain intensity without signs/symptoms of physical distress. Continue to progress workloads to maintain intensity without signs/symptoms of physical distress. Continue to progress workloads to maintain intensity without signs/symptoms of physical distress.   Average METs -- 1.86 1.86 1.81 1.9     Resistance Training   Training Prescription -- Yes Yes Yes Yes   Weight -- 2 lb 2 lb 2 lb 2 lb   Reps -- 10-15 10-15 10-15 10-15     Interval Training   Interval Training -- No No No No     Treadmill   MPH -- 0.8 0.8 1.4 0.9   Grade -- 0 0 0 0   Minutes -- 15 15 15 15    METs -- 1.6 1.6 2.07 1.69     NuStep   Level -- 1 1 2 2    Minutes -- 15 15 15 15    METs -- 2 2 2  2.4     Biostep-RELP   Level -- 1 1 2  --   Minutes -- 15 15 15  --   METs -- 2 2 1.2 --     Home Exercise Plan   Plans to continue exercise at -- -- Home (comment)  walking at home outside or on TM and continue to do PT exercises. Home (comment)  walking at home outside or on TM and continue to do PT exercises. --   Frequency -- -- Add 2 additional days to program exercise sessions. Add 2 additional days to program exercise sessions. --   Initial Home Exercises Provided -- -- 02/27/22 02/27/22 --     Oxygen   Maintain Oxygen Saturation -- 88% or higher 88% or higher 88% or higher 88% or higher    Row Name 04/02/22 0900 04/16/22 0900  05/01/22 1300 05/15/22 1400 05/29/22 1400     Response to Exercise   Blood Pressure (Admit) 118/70 122/62 132/70 120/64 104/62   Blood Pressure (Exercise) 140/68 132/64 -- -- --   Blood Pressure (Exit) 108/56 126/74 124/82 112/72 118/60   Heart Rate (Admit) 62 bpm 52 bpm 64 bpm 67 bpm 63 bpm   Heart Rate (Exercise) 76 bpm 73 bpm 73 bpm 79 bpm 82 bpm   Heart Rate (Exit) 56 bpm 54 bpm 56 bpm 66 bpm 60 bpm   Oxygen Saturation (Admit) 98 % 98 % 98 % 99 % 97 %   Oxygen Saturation (Exercise) 94 % 88 % 91 % 92 % 88 %   Oxygen Saturation (Exit) 97 % 94 % 99 % 99 % 97 %   Rating of Perceived Exertion (Exercise) 13 14 15 14 14    Perceived Dyspnea (Exercise) 0 1 0 1 1   Symptoms none none none none none   Duration Continue  with 30 min of aerobic exercise without signs/symptoms of physical distress. Continue with 30 min of aerobic exercise without signs/symptoms of physical distress. Continue with 30 min of aerobic exercise without signs/symptoms of physical distress. Continue with 30 min of aerobic exercise without signs/symptoms of physical distress. Continue with 30 min of aerobic exercise without signs/symptoms of physical distress.   Intensity THRR unchanged THRR unchanged THRR unchanged THRR unchanged THRR unchanged     Progression   Progression Continue to progress workloads to maintain intensity without signs/symptoms of physical distress. Continue to progress workloads to maintain intensity without signs/symptoms of physical distress. Continue to progress workloads to maintain intensity without signs/symptoms of physical distress. Continue to progress workloads to maintain intensity without signs/symptoms of physical distress. Continue to progress workloads to maintain intensity without signs/symptoms of physical distress.   Average METs 1.85 2.29 2.09 2.19 2.08     Resistance Training   Training Prescription Yes Yes Yes Yes Yes   Weight 2 lb 2 lb 2 lb 2 lb 2 lb   Reps 10-15 10-15 10-15 10-15  10-15     Interval Training   Interval Training No No No No No     Treadmill   MPH 1.3 1 1.5 2 1.5   Grade 0 0 0.5 0 0   Minutes 15 15 15 15 15    METs 2 1.77 2.26 2.53 2.15     Recumbant Bike   Level -- 2 -- -- --   Minutes -- 15 -- -- --   METs -- 3.2 -- -- --     NuStep   Level 3 3 4 3 3    Minutes 15 30 15 15 15    METs 1.8 2.3 2 2  2.3     Biostep-RELP   Level 1 -- 1 -- 3   Minutes 15 -- 15 -- 15   METs 2 -- -- -- 2     Home Exercise Plan   Plans to continue exercise at Home (comment)  walking at home outside or on TM and continue to do PT exercises. Home (comment)  walking at home outside or on TM and continue to do PT exercises. Home (comment)  walking at home outside or on TM and continue to do PT exercises. Home (comment)  walking at home outside or on TM and continue to do PT exercises. Home (comment)  walking at home outside or on TM and continue to do PT exercises.   Frequency Add 2 additional days to program exercise sessions. Add 2 additional days to program exercise sessions. Add 2 additional days to program exercise sessions. Add 2 additional days to program exercise sessions. Add 2 additional days to program exercise sessions.   Initial Home Exercises Provided 02/27/22 02/27/22 02/27/22 02/27/22 02/27/22     Oxygen   Maintain Oxygen Saturation 88% or higher 88% or higher 88% or higher 88% or higher 88% or higher    Row Name 06/12/22 1400 06/27/22 1600           Response to Exercise   Blood Pressure (Admit) 112/80 124/58      Blood Pressure (Exit) 116/68 110/60      Heart Rate (Admit) 9 bpm 63 bpm      Heart Rate (Exercise) 102 bpm 84 bpm      Heart Rate (Exit) 65 bpm 59 bpm      Oxygen Saturation (Admit) 96 % 95 %      Oxygen Saturation (Exercise) 93 % 90 %  Oxygen Saturation (Exit) 96 % 92 %      Rating of Perceived Exertion (Exercise) 11 11      Perceived Dyspnea (Exercise) 0 2      Symptoms none SOB      Duration Continue with 30 min of aerobic  exercise without signs/symptoms of physical distress. Continue with 30 min of aerobic exercise without signs/symptoms of physical distress.      Intensity THRR unchanged THRR unchanged        Progression   Progression Continue to progress workloads to maintain intensity without signs/symptoms of physical distress. Continue to progress workloads to maintain intensity without signs/symptoms of physical distress.      Average METs 2.09 2.53        Resistance Training   Training Prescription Yes Yes      Weight 2 lb 2 lb      Reps 10-15 10-15        Interval Training   Interval Training No No        Treadmill   MPH 1.8 2      Grade 0 0      Minutes 15 15      METs 2.38 2.53        NuStep   Level 3 4      Minutes 15 15      METs 1.9 --        REL-XR   Level -- 1      Minutes -- 15        Biostep-RELP   Level 2 --      Minutes 15 --      METs 2 --        Home Exercise Plan   Plans to continue exercise at Home (comment)  walking at home outside or on TM and continue to do PT exercises. Home (comment)  walking at home outside or on TM and continue to do PT exercises.      Frequency Add 2 additional days to program exercise sessions. Add 2 additional days to program exercise sessions.      Initial Home Exercises Provided 02/27/22 02/27/22        Oxygen   Maintain Oxygen Saturation 88% or higher 88% or higher               Exercise Comments:   Exercise Comments     Row Name 02/13/22 6962           Exercise Comments First full day of exercise!  Patient was oriented to gym and equipment including functions, settings, policies, and procedures.  Patient's individual exercise prescription and treatment plan were reviewed.  All starting workloads were established based on the results of the 6 minute walk test done at initial orientation visit.  The plan for exercise progression was also introduced and progression will be customized based on patient's performance and goals.                 Exercise Goals and Review:   Exercise Goals     Row Name 02/12/22 1519             Exercise Goals   Increase Physical Activity Yes       Intervention Provide advice, education, support and counseling about physical activity/exercise needs.;Develop an individualized exercise prescription for aerobic and resistive training based on initial evaluation findings, risk stratification, comorbidities and participant's personal goals.       Expected Outcomes Short Term: Attend rehab on  a regular basis to increase amount of physical activity.;Long Term: Add in home exercise to make exercise part of routine and to increase amount of physical activity.;Long Term: Exercising regularly at least 3-5 days a week.       Increase Strength and Stamina Yes       Intervention Provide advice, education, support and counseling about physical activity/exercise needs.;Develop an individualized exercise prescription for aerobic and resistive training based on initial evaluation findings, risk stratification, comorbidities and participant's personal goals.       Expected Outcomes Short Term: Increase workloads from initial exercise prescription for resistance, speed, and METs.;Short Term: Perform resistance training exercises routinely during rehab and add in resistance training at home;Long Term: Improve cardiorespiratory fitness, muscular endurance and strength as measured by increased METs and functional capacity (6MWT)       Able to understand and use rate of perceived exertion (RPE) scale Yes       Intervention Provide education and explanation on how to use RPE scale       Expected Outcomes Short Term: Able to use RPE daily in rehab to express subjective intensity level;Long Term:  Able to use RPE to guide intensity level when exercising independently       Able to understand and use Dyspnea scale Yes       Intervention Provide education and explanation on how to use Dyspnea scale        Expected Outcomes Short Term: Able to use Dyspnea scale daily in rehab to express subjective sense of shortness of breath during exertion;Long Term: Able to use Dyspnea scale to guide intensity level when exercising independently       Knowledge and understanding of Target Heart Rate Range (THRR) Yes       Intervention Provide education and explanation of THRR including how the numbers were predicted and where they are located for reference       Expected Outcomes Short Term: Able to state/look up THRR;Long Term: Able to use THRR to govern intensity when exercising independently;Short Term: Able to use daily as guideline for intensity in rehab       Able to check pulse independently Yes       Intervention Provide education and demonstration on how to check pulse in carotid and radial arteries.;Review the importance of being able to check your own pulse for safety during independent exercise       Expected Outcomes Short Term: Able to explain why pulse checking is important during independent exercise;Long Term: Able to check pulse independently and accurately       Understanding of Exercise Prescription Yes       Intervention Provide education, explanation, and written materials on patient's individual exercise prescription       Expected Outcomes Short Term: Able to explain program exercise prescription;Long Term: Able to explain home exercise prescription to exercise independently                Exercise Goals Re-Evaluation :  Exercise Goals Re-Evaluation     Row Name 02/13/22 7867 02/22/22 1241 02/27/22 1000 03/07/22 0847 03/19/22 1608     Exercise Goal Re-Evaluation   Exercise Goals Review Increase Physical Activity;Able to understand and use rate of perceived exertion (RPE) scale;Knowledge and understanding of Target Heart Rate Range (THRR);Understanding of Exercise Prescription;Able to understand and use Dyspnea scale;Able to check pulse independently;Increase Strength and Stamina  Increase Physical Activity;Increase Strength and Stamina;Understanding of Exercise Prescription Increase Physical Activity;Increase Strength and Stamina;Able to understand and use  rate of perceived exertion (RPE) scale;Able to understand and use Dyspnea scale;Knowledge and understanding of Target Heart Rate Range (THRR);Able to check pulse independently;Understanding of Exercise Prescription Increase Physical Activity;Increase Strength and Stamina;Understanding of Exercise Prescription Increase Physical Activity;Increase Strength and Stamina;Understanding of Exercise Prescription   Comments Reviewed RPE and dyspnea scales, THR and program prescription with pt today.  Pt voiced understanding and was given a copy of goals to take home. Robin Ortiz is off to a good start in rehab. She has completed her first four full days of exercise thus far.  We will continue to montior her progress. Reviewed home exercise with pt today.  Pt plans to walk and do PT exercises for exercise.  Reviewed THR, pulse, RPE, sign and symptoms, pulse oximetery and when to call 911 or MD.  Also discussed weather considerations and indoor options.  Pt voiced understanding. Robin Ortiz has been doing well in rehab. She improved to level 2 on the biostep machine, and the T4 NuStep. She has also increased her speed on the treadmill to 1.4 mph. She has tolerated using 2 lb hand weights for resistance training. We will continue to monitor her progress in the program. Robin Ortiz continues to do well in rehab. She was out of town the last week and only came for a couple sessions during this review. Walking on the treadmill is a challenge for her and we hope to see her increase her speed over time. She would benefit from increasing her handweights to 3 lbs. Will continue to monitor.   Expected Outcomes Short: Use RPE daily to regulate intensity. Long: Follow program prescription in THR. Short: Continue to attend rehab regularly Long: Continue to follow  program prescription Short: add 1-2 days of exercise at home on off days of class. Long: become independent with exercise routine. Short: continue to increase speed on treadmill. Long: Continue to increase strength and stamina. Short: Increase handweights to 3 lbs Long: Continue to increase overall MET level    Row Name 04/02/22 0940 04/05/22 1014 04/12/22 1010 04/16/22 0906 05/01/22 1403     Exercise Goal Re-Evaluation   Exercise Goals Review Increase Physical Activity;Increase Strength and Stamina;Understanding of Exercise Prescription Increase Physical Activity;Increase Strength and Stamina;Understanding of Exercise Prescription Increase Physical Activity;Increase Strength and Stamina;Understanding of Exercise Prescription Increase Physical Activity;Increase Strength and Stamina;Understanding of Exercise Prescription Increase Physical Activity;Increase Strength and Stamina;Understanding of Exercise Prescription   Comments Samariah is doing well in rehab. She improved to level 3 on the T4. She also worked her speed back up to 1.3 mph on the treadmill. Robin Ortiz is up to 2 METs on the BioStep as well. We will continue to monitor her progress in the program. Montoya reports staying active, but not doing any exercise outside of rehab. She reports she is not as strong as she used to be. Reviewed that she could use her PT exercises and walking for exercise outside of rehab as she discussed with EP when going over home exercise. She reports that she has used cans of soup in the past for weighted exercise. Robin Ortiz reports staying active and going in her yard, but not doing any exercise outside of rehab. She reports she is not as strong as she used to be and feels weak. Reviewed again that she could use her PT exercises and walking for exercise outside of rehab as she discussed with EP when going over home exercise. Quincey is doing well in rehab. She started using the recumbent bike at level 2 and did well.  She  also improved her overall average MET level to 2.29 METs. She has also tolerated using 2 lb hand weights for resistance training and may benefit from trying 3 lbs. We will continue to monitor her progress. Robin Ortiz is doing well in rehab. She recently improved to 1.5 mph and an incline of 0.5% on the treadmill. She also went up to level 4 on the T4. She has also tolerated using 2 lb hand weights for resistance training as well. We will continue to monitor her progress in the program.   Expected Outcomes Short: Increase handweights to 3 lbs Long: Continue to improve strength and stamina. Short: begin structured exercise outside of rehab Long: Continue to improve strength and stamina. Short: begin structured exercise outside of rehab Long: Continue to improve strength and stamina. Short: try using 3 lb hand weights. Long: Continue to improve overall MET levels. Short: try using 3 lb hand weights. Long: Continue to improve overall MET levels.    [place] Name 05/15/22 1502 05/17/22 1018 05/29/22 1432 06/12/22 1423 06/19/22 1019     Exercise Goal Re-Evaluation   Exercise Goals Review Increase Physical Activity;Increase Strength and Stamina;Understanding of Exercise Prescription Increase Physical Activity;Increase Strength and Stamina;Understanding of Exercise Prescription Increase Physical Activity;Increase Strength and Stamina;Understanding of Exercise Prescription Increase Physical Activity;Increase Strength and Stamina;Understanding of Exercise Prescription Increase Physical Activity;Increase Strength and Stamina;Understanding of Exercise Prescription   Comments Robin Ortiz continues to do well in rehab. She recently increased her overall average MET level back up to 2.19 METs. She also increased her speed on the treadmill to 2 mph. She also has tolerated 2 lb hand weights for resistance training as well. We will continue to monitor her progress in the program. Robin Ortiz reports staying active and going in her yard,  but not doing any exercise outside of rehab, she has been struggling with fatigue and her ADLs. She has tried some of the exercises from the home exercise packet and her treadmill, but not consistently. She reports she is not as strong as she used to be and feels weak. She sees her doctor next week to follow up about fatigue. Robin Ortiz has been doing well in rehab. She has consistently been walking at a speed of 1.5 mph on the treadmill, but would benefit from adding some incline. She has also tolerated level 4 on the T4. She has tolerated 2 lb hand weights for resistance training. We will continue to monitor her progress in the program. Robin Ortiz improved her post 6MWT by 155 ft!!  She is up to 1.8 mph on the treadmill as well.  We will continue to monitor her progress as she works toward graduation. Robin Ortiz improved her post 6MWT by 155 ft!! She also expressed that through the program she can tell that her breathing and endurance has improved. She has also stated that she would like to try water aerobics. She was also encouraged to walk at home for exercise. She also states that she has some hand weights at home she could use for resistance training.   Expected Outcomes Short: try using 3 lb hand weights. Long: Continue to increase strength and stamina. Short: follow up with MD about fatigue, strive to inlcude 1-2 days of structured exercise at home Long: Continue to increase strength and stamina. Short: Add incline on the treadmill. Long: Continue to improve strength and stamina. Short: Continue to attend to graduate Long: Continue to exercise independently Short: Continue to attend to graduate Long: Continue to exercise independently    Row  Name 06/27/22 1611             Exercise Goal Re-Evaluation   Exercise Goals Review Increase Physical Activity;Increase Strength and Stamina;Understanding of Exercise Prescription       Comments Robin Ortiz is doing well in rehab and is close to graduating. She recently  improved on her post 6MWT by 18%. She also increased her speed on the treadmill to 2 mph. She also increased her overall average MET level to 2.53 METs. We will continue to monitor her progress until she graduates from the program.       Expected Outcomes Short: Graduate Long: Continue to exercise independently                Discharge Exercise Prescription (Final Exercise Prescription Changes):  Exercise Prescription Changes - 06/27/22 1600       Response to Exercise   Blood Pressure (Admit) 124/58    Blood Pressure (Exit) 110/60    Heart Rate (Admit) 63 bpm    Heart Rate (Exercise) 84 bpm    Heart Rate (Exit) 59 bpm    Oxygen Saturation (Admit) 95 %    Oxygen Saturation (Exercise) 90 %    Oxygen Saturation (Exit) 92 %    Rating of Perceived Exertion (Exercise) 11    Perceived Dyspnea (Exercise) 2    Symptoms SOB    Duration Continue with 30 min of aerobic exercise without signs/symptoms of physical distress.    Intensity THRR unchanged      Progression   Progression Continue to progress workloads to maintain intensity without signs/symptoms of physical distress.    Average METs 2.53      Resistance Training   Training Prescription Yes    Weight 2 lb    Reps 10-15      Interval Training   Interval Training No      Treadmill   MPH 2    Grade 0    Minutes 15    METs 2.53      NuStep   Level 4    Minutes 15      REL-XR   Level 1    Minutes 15      Home Exercise Plan   Plans to continue exercise at Home (comment)   walking at home outside or on TM and continue to do PT exercises.   Frequency Add 2 additional days to program exercise sessions.    Initial Home Exercises Provided 02/27/22      Oxygen   Maintain Oxygen Saturation 88% or higher             Nutrition:  Target Goals: Understanding of nutrition guidelines, daily intake of sodium <1580m, cholesterol <2081m calories 30% from fat and 7% or less from saturated fats, daily to have 5 or more  servings of fruits and vegetables.  Education: All About Nutrition: -Group instruction provided by verbal, written material, interactive activities, discussions, models, and posters to present general guidelines for heart healthy nutrition including fat, fiber, MyPlate, the role of sodium in heart healthy nutrition, utilization of the nutrition label, and utilization of this knowledge for meal planning. Follow up email sent as well. Written material given at graduation. [facility]  Education need identified 02/12/22  Date 02/22/22  [facility] 1- Verbalizes Understanding       Biometrics:  Pre Biometrics - 02/12/22 1520       Pre Biometrics   Height  5' 5"  (1.651 m)    Weight 143 lb 11.2 oz (65.2 kg)    BMI (Calculated) 23.91    Single Leg Stand 2.1 seconds             Post Biometrics - 06/12/22 1023        Post  Biometrics   Weight 136 lb 3.2 oz (61.8 kg)    BMI (Calculated) 22.66    Single Leg Stand 2.73 seconds             Nutrition Therapy Plan and Nutrition Goals:  Nutrition Therapy & Goals - 02/22/22 1215       Nutrition Therapy   Diet Heart healthy, low Na    Drug/Food Interactions Statins/Certain Fruits    Protein (specify units) 60-65g    Fiber 21 grams    Whole Grain Foods 3 servings    Saturated Fats 12 max. grams    Fruits and Vegetables 8 servings/day    Sodium 2 grams      Personal Nutrition Goals   Nutrition Goal ST: add protein/fat to snacks (peanut butter, cottage cheese, cheese cubes), try protein ball recipe LT: Maintain weight, meet calorie/protein needs    Comments 87 y.o. F admitted to rehab for chronic systolic heart failure. PMHx includes HTN, HLD, osteoporosis, NICM, anxiety, cardiomegaly, chronic fatigue. Relevant medications includes Ca-vit D, lasix, K+, MVI, evesta, zocor. PYP Score: 61. Vegetables & Fruits 8/12. Breads, Grains & Cereals  5/12. Red & Processed Meat 8/12. Poultry 2/2. Fish & Shellfish 1/4. Beans, Nuts & Seeds 2/4. Milk & Dairy Foods 3/6. Toppings, Oils, Seasonings & Salt 12/20. Sweets, Snacks & Restaurant Food 11/14. Beverages 9/10.  Anabella tries to eat smaller, more frequent meals as her appetite has been lower. She reports her doctor told her not to lose anymore weight. She enjoys fruit and vegetables and keeps starchy vegetables and grains to a minimum on her plate. She enjoys chicken as her primary source of protein and is open to including other sources of protein for snacks such as peanut butter, cheese, and cottage cheese to add with her fruit to bump up the nutrition. She uses olive oil and some butter for cooking and she does not salt her food. For breakfast she enjoys having a bar or cream of wehat or oatmeal - encouraged her to have oatmeal more often and bulking up it with fruit like berries, and nuts/seeds. She enjoys sourdough bread, but does not eat it frequently. She used to enjoy sweets more, but has not had as much of a taste for them - discussed another snack option such as energy balls that include nut/seed butter, oats, some liquid sweetener or water to add mositure, and add ins like dried friet, chocolate chips, or cocoa powder - she was interested in this. She limits her fluid, but water is her drink of choice. Discussed general heart healthy eating.      Intervention Plan   Intervention Prescribe, educate and counsel regarding individualized specific dietary modifications aiming towards targeted core components such as weight, hypertension, lipid management, diabetes, heart failure and other comorbidities.    Expected Outcomes Short Term Goal: Understand basic principles of dietary content, such as calories, fat, sodium, cholesterol and nutrients.;Short Term Goal: A plan has been developed with personal nutrition goals set during dietitian appointment.;Long Term Goal: Adherence to prescribed nutrition  plan.             Nutrition Assessments:  MEDIFICTS Score Key: ?70 Need to make dietary changes  40-70 Heart Healthy Diet ? 40 Therapeutic Level Cholesterol Diet  Flowsheet Row Pulmonary Rehab from 06/19/2022 in [facility]  Picture Your Plate Total Score on Admission 61  Picture Your Plate Total Score on Discharge 73      Picture Your Plate Scores: <16 Unhealthy dietary pattern with much room for improvement. 41-50 Dietary pattern unlikely to meet recommendations for good health and room for improvement. 51-60 More healthful dietary pattern, with some room for improvement.  >60 Healthy dietary pattern, although there may be some specific behaviors that could be improved.   Nutrition Goals Re-Evaluation:  Nutrition Goals Re-Evaluation     [place] Name 04/05/22 0957 04/12/22 1007 05/17/22 0957 06/19/22 1023       Goals   Nutrition Goal ST: continue to add protein/fat to snacks (peanut butter, cottage cheese, cheese cubes), try protein ball recipe LT: Maintain weight, meet calorie/protein needs ST: continue to add protein/fat to snacks (peanut butter, cottage cheese, cheese cubes), try protein ball recipe LT: Maintain weight, meet calorie/protein needs ST: continue to add protein/fat to snacks (peanut butter, cottage cheese, cheese cubes), continue practicing MyPLate structure and variety LT: Maintain weight, meet calorie/protein needs --    Comment Kabella reports continuing to make herself eat - due to her chronic nausea. She continues to include fruits, vegetables, lean proteins, and heart healthy fats. She has been having cottage cheese and peanut butter during the day as well.  She has not tried the protein ball recipe yet, but would like to. Yavonne reports continuing to make herself eat - due to her chronic nausea. She continues to include fruits, vegetables, lean proteins, and heart healthy fats. She continues to have cottage cheese and peanut butter during  the day as well.  She has still not tried the protein ball recipe yet, but would like to. Inika reports continuing to make herself eat even without the nausea; she just got off the amlodipine. She continues to include fruits, vegetables, lean proteins, and heart healthy fats. She continues to have cottage cheese and peanut butter during the day as well.  She has still not tried the protein ball recipe yet, but would like to; by the time she remembers it is after she went food shopping. Tonea reports continuing to make herself eat even without the nausea; she just got off the amlodipine. She continues to include fruits, vegetables, lean proteins, and heart healthy fats. She continues to have cottage cheese and peanut butter during the day as well.  She has still not tried the protein ball recipe yet, but would like to; by the time she remembers it is after she went food shopping.    Expected Outcome ST: continue to add protein/fat to snacks (peanut butter, cottage cheese, cheese cubes), try protein ball recipe LT: Maintain weight, meet calorie/protein needs ST: continue to add protein/fat to snacks (peanut butter, cottage cheese, cheese cubes), try protein ball recipe LT: Maintain weight, meet calorie/protein needs ST: continue to add protein/fat to snacks (peanut butter, cottage cheese, cheese cubes), continue practicing MyPLate structure and varietyLT: Maintain weight, meet calorie/protein needs ST: continue to add protein/fat to snacks (peanut butter, cottage cheese, cheese cubes), continue practicing MyPLate structure and varietyLT: Maintain weight, meet calorie/protein needs             Nutrition Goals Discharge (Final Nutrition Goals Re-Evaluation):  Nutrition Goals Re-Evaluation - 06/19/22 1023       Goals   Comment Kismet reports continuing to make herself eat even  without the nausea; she just got off the amlodipine. She continues to include fruits, vegetables, lean proteins, and heart  healthy fats. She continues to have cottage cheese and peanut butter during the day as well.  She has still not tried the protein ball recipe yet, but would like to; by the time she remembers it is after she went food shopping.    Expected Outcome ST: continue to add protein/fat to snacks (peanut butter, cottage cheese, cheese cubes), continue practicing MyPLate structure and varietyLT: Maintain weight, meet calorie/protein needs             Psychosocial: Target Goals: Acknowledge presence or absence of significant depression and/or stress, maximize coping skills, provide positive support system. Participant is able to verbalize types and ability to use techniques and skills needed for reducing stress and depression.   Education: Stress, Anxiety, and Depression - Group verbal and visual presentation to define topics covered.  Reviews how body is impacted by stress, anxiety, and depression.  Also discusses healthy ways to reduce stress and to treat/manage anxiety and depression.  Written material given at graduation. Flowsheet Row Pulmonary Rehab from 05/10/2022 in [facility] Card[facility] and Pulmonary Rehab  Date 03/22/22  Educator [facility]  Instruction Review Code 1- [employer] Understanding       Education: Sleep Hygiene -Provides group verbal and written instruction about how sleep can affect your health.  Define sleep hygiene, discuss sleep cycles and impact of sleep habits. Review good sleep hygiene tips.    Initial Review & Psychosocial Screening:  Initial Psych Review & Screening - 02/01/22 0913       Initial Review   Current issues with None Identified      Family Dynamics   Good Support System? Yes    Comments Winona is in good spirtirs and has four kids that she can turn to for support. She has her husband that she looks after at times and has great support from him.      Barriers   Psychosocial barriers to participate in program The patient should benefit from training in stress  management and relaxation.;There are no identifiable barriers or psychosocial needs.      Screening Interventions   Interventions Encouraged to exercise;Provide feedback about the scores to participant;To provide support and resources with identified psychosocial needs    Expected Outcomes Long Term Goal: Stressors or current issues are controlled or eliminated.;Short Term goal: Utilizing psychosocial counselor, staff and physician to assist with identification of specific Stressors or current issues interfering with healing process. Setting desired goal for each stressor or current issue identified.;Short Term goal: Identification and review with participant of any Quality of Life or Depression concerns found by scoring the questionnaire.;Long Term goal: The participant improves quality of Life and PHQ9 Scores as seen by post scores and/or verbalization of changes             Quality of Life Scores:  Quality of Life - 02/12/22 1523       Quality of Life   Select Quality of Life      Quality of Life Scores   Health/Function Pre 22.23 %    Socioeconomic Pre 24 %    Psych/Spiritual Pre 23.36 %    Family Pre 25.2 %    GLOBAL Pre 23.31 %            Scores of 19 and below usually indicate a poorer quality of life in these areas.  A difference of  2-3 points is a clinically  meaningful difference.  A difference of 2-3 points in the total score of the Quality of Life Index has been associated with significant improvement in overall quality of life, self-image, physical symptoms, and general health in studies assessing change in quality of life.  PHQ-9: Review Flowsheet       06/19/2022 04/05/2022 03/15/2022 02/12/2022  Depression screen PHQ 2/9  Decreased Interest 1 1 1 1   Down, Depressed, Hopeless 1 0 1 1  PHQ - 2 Score 2 1 2 2   Altered sleeping 1 0 1 1  Tired, decreased energy 1 0 1 1  Change in appetite 1 0 1 3  Feeling bad or failure about yourself  0 1 0 1  Trouble concentrating  0 0 0 0  Moving slowly or fidgety/restless 0 0 0 1  Suicidal thoughts 0 0 0 0  PHQ-9 Score 5 2 5 9   Difficult doing work/chores Not difficult at all Somewhat difficult Somewhat difficult Somewhat difficult   Interpretation of Total Score  Total Score Depression Severity:  1-4 = Minimal depression, 5-9 = Mild depression, 10-14 = Moderate depression, 15-19 = Moderately severe depression, 20-27 = Severe depression   Psychosocial Evaluation and Intervention:  Psychosocial Evaluation - 02/01/22 0915       Psychosocial Evaluation & Interventions   Interventions Relaxation education;Stress management education;Encouraged to exercise with the program and follow exercise prescription    Comments Tameyah is in good spirtirs and has four kids that she can turn to for support. She has her husband that she looks after at times and has great support from him.    Expected Outcomes Short: Start [facility] to help with mood. Long: Maintain a healthy mental state.    Continue Psychosocial Services  Follow up required by staff             Psychosocial Re-Evaluation:  Psychosocial Re-Evaluation     [place] Name 02/27/22 1023 04/05/22 1000 04/12/22 1003 05/17/22 1007 06/19/22 1024     Psychosocial Re-Evaluation   Current issues with None Identified Current Stress Concerns Current Stress Concerns;Current Sleep Concerns Current Stress Concerns;Current Sleep Concerns Current Stress Concerns;Current Depression   Comments Patient reports that she does have some trouble getting back to sleep around 3 am, but for the most part sleeps well and has no new stress concerns. She went up to her house by [facility] last weekend, she has missed going - her husband does not like to go and he needs her to help to remember to take his medications. She reports always having stress looking after her husband. She relies on "the good lord" for support as well her husbands Network engineer and daughters. She is also getting help  right now from a maid. She reports feeling tired as she is always busy and she is more tired the day after coming to rehab. She has a poor appetite sometimes due to chronic nausea. She reports her friends have either passed or are in nursing homes - she will visit occassionally. She sometimes will take a nap, watch Murder She Wrote, sit and watch the birds, and play with her dog to help to reduce stress. Keni recently had a birthday and went to see a movie ([place]). She reports no major stressors at this time, but she always has things going on. This weekend she went to a family event and had a good time, but she is tired this week because of it. She continues to feel weak and get worn out  easily from how busy she is. She reports always having stress looking after her husband. She continues to rely on "the good lord" for support as well her husbands Network engineer and daughters. She is also getting help right now from a maid which she reports has really changed her life. She sometimes will take a nap, enjoy mysteries, sit and watch the birds, and play with her dog to help to reduce stress. She continues to sleep "too well"; she gets about 7 hours of sleep a night and will take a nap as well. Manar reports no major stressors at this time, but she always has things going on, but she feels like she has a lot to do and she has been very fatigued. She reports that her husband needs a fair amount of help as well which can cause her stress. Her friend is 47 y.o. and her daughter who usually takes care of her recently passed; Yobana has been going over to help her friends' other daughters take care of her. She continues to rely on "the good lord" for support as well her husbands Network engineer and daughters. She is also getting help right now from a maid which she reports has really changed her life. She sometimes will take a nap, enjoy mysteries, sit and watch the birds, and play with her dog to help to reduce  stress. She continues to sleep "too well"; she gets about 7 hours of sleep a night and will take a nap as well. Robin Ortiz states that she is dealing with some stress that comes and goes. She also expressed that she is dealing with some depression related to not being as healthy as she used to be. She states that she usually will relax or take a nap when she is feeling stress or depressive symptoms. She also claims that her faith is helpful for overcoming stress as well.   Expected Outcomes Short: continue to attend pulmonary rehab for mental health benefits. Long: maintain good mental health habits independently ST: continue to attend pulmonary rehab for mental health benefits. LT: maintain good mental health habits independently ST: continue to attend pulmonary rehab for mental health benefits. LT: maintain good mental health habits independently ST: continue to attend pulmonary rehab for mental health benefits. LT: maintain good mental health habits independently Short: Continue to relieve stress through exercise or relaxation techniques. Long: Continue to maintain positive outlook.   Interventions Encouraged to attend Pulmonary Rehabilitation for the exercise Encouraged to attend Pulmonary Rehabilitation for the exercise Encouraged to attend Pulmonary Rehabilitation for the exercise Encouraged to attend Pulmonary Rehabilitation for the exercise Encouraged to attend Pulmonary Rehabilitation for the exercise   Continue Psychosocial Services  Follow up required by staff Follow up required by staff Follow up required by staff Follow up required by staff Follow up required by staff     Initial Review   Source of Stress Concerns -- Family Family Family;Chronic Illness --            Psychosocial Discharge (Final Psychosocial Re-Evaluation):  Psychosocial Re-Evaluation - 06/19/22 1024       Psychosocial Re-Evaluation   Current issues with Current Stress Concerns;Current Depression    Comments  Robin Ortiz states that she is dealing with some stress that comes and goes. She also expressed that she is dealing with some depression related to not being as healthy as she used to be. She states that she usually will relax or take a nap when she is feeling stress or depressive symptoms. She  also claims that her faith is helpful for overcoming stress as well.    Expected Outcomes Short: Continue to relieve stress through exercise or relaxation techniques. Long: Continue to maintain positive outlook.    Interventions Encouraged to attend Pulmonary Rehabilitation for the exercise    Continue Psychosocial Services  Follow up required by staff             Education: Education Goals: Education classes will be provided on a weekly basis, covering required topics. Participant will state understanding/return demonstration of topics presented.  Learning Barriers/Preferences:  Learning Barriers/Preferences - 02/01/22 0909       Learning Barriers/Preferences   Learning Barriers None    Learning Preferences None             General Pulmonary Education Topics:  Infection Prevention: - Provides verbal and written material to individual with discussion of infection control including proper hand washing and proper equipment cleaning during exercise session. Flowsheet Row Pulmonary Rehab from 05/10/2022 in [facility] Card[facility] and Pulmonary Rehab  Date 02/12/22  Educator [facility]  Instruction Review Code 1- Verbalizes Understanding       Falls Prevention: - Provides verbal and written material to individual with discussion of falls prevention and safety. Flowsheet Row Pulmonary Rehab from 05/10/2022 in [facility] Card[facility] and Pulmonary Rehab  Date 02/12/22  Educator [facility]  Instruction Review Code 1- Verbalizes Understanding       Chronic Lung Disease Review: - Group verbal instruction with posters, models, PowerPoint presentations and videos,  to review new updates, new respiratory medications, new  advancements in procedures and treatments. Providing information on websites and "800" numbers for continued self-education. Includes information about supplement oxygen, available portable oxygen systems, continuous and intermittent flow rates, oxygen safety, concentrators, and Medicare reimbursement for oxygen. Explanation of Pulmonary Drugs, including class, frequency, complications, importance of spacers, rinsing mouth after steroid MDI's, and proper cleaning methods for nebulizers. Review of basic lung anatomy and physiology related to function, structure, and complications of lung disease. Review of risk factors. Discussion about methods for diagnosing sleep apnea and types of masks and machines for OSA. Includes a review of the use of types of environmental controls: home humidity, furnaces, filters, dust mite/pet prevention, HEPA vacuums. Discussion about weather changes, air quality and the benefits of nasal washing. Instruction on Warning signs, infection symptoms, calling MD promptly, preventive modes, and value of vaccinations. Review of effective airway clearance, coughing and/or vibration techniques. Emphasizing that all should Create an Action Plan. Written material given at graduation. Flowsheet Row Pulmonary Rehab from 05/10/2022 in [facility] Cardiac and Pulmonary Rehab  Date 03/15/22  Educator [facility]  Instruction Review Code 1- Verbalizes Understanding       AED/CPR: - Group verbal and written instruction with the use of models to demonstrate the basic use of the AED with the basic ABC's of resuscitation.    Anatomy and Cardiac Procedures: - Group verbal and visual presentation and models provide information about basic cardiac anatomy and function. Reviews the testing methods done to diagnose heart disease and the outcomes of the test results. Describes the treatment choices: Medical Management, Angioplasty, or Coronary Bypass Surgery for treating various heart conditions including Myocardial  Infarction, Angina, Valve Disease, and Cardiac Arrhythmias.  Written material given at graduation. Flowsheet Row Pulmonary Rehab from 05/10/2022 in [facility] Cardiac and Pulmonary Rehab  Education need identified 02/12/22  Date 04/12/22  Educator [facility]  Instruction Review Code 1- Verbalizes Understanding       Medication Safety: - Group verbal and  visual instruction to review commonly prescribed medications for heart and lung disease. Reviews the medication, class of the drug, and side effects. Includes the steps to properly store meds and maintain the prescription regimen.  Written material given at graduation.   Other: -Provides group and verbal instruction on various topics (see comments)   Knowledge Questionnaire Score:  Knowledge Questionnaire Score - 06/19/22 1117       Knowledge Questionnaire Score   Pre Score 22/26              Core Components/Risk Factors/Patient Goals at Admission:  Personal Goals and Risk Factors at Admission - 02/12/22 1523       Core Components/Risk Factors/Patient Goals on Admission    Weight Management Yes;Weight Maintenance    Intervention Weight Management: Develop a combined nutrition and exercise program designed to reach desired caloric intake, while maintaining appropriate intake of nutrient and fiber, sodium and fats, and appropriate energy expenditure required for the weight goal.;Weight Management: Provide education and appropriate resources to help participant work on and attain dietary goals.;Weight Management/Obesity: Establish reasonable short term and long term weight goals.    Admit Weight 143 lb 11.2 oz (65.2 kg)    Goal Weight: Short Term 143 lb 11.2 oz (65.2 kg)    Goal Weight: Long Term 143 lb 11.2 oz (65.2 kg)    Expected Outcomes Short Term: Continue to assess and modify interventions until short term weight is achieved;Long Term: Adherence to nutrition and physical activity/exercise program aimed toward attainment of established  weight goal;Weight Maintenance: Understanding of the daily nutrition guidelines, which includes 25-35% calories from fat, 7% or less cal from saturated fats, less than 239m cholesterol, less than 1.5gm of sodium, & 5 or more servings of fruits and vegetables daily;Understanding recommendations for meals to include 15-35% energy as protein, 25-35% energy from fat, 35-60% energy from carbohydrates, less than 2014mof dietary cholesterol, 20-35 gm of total fiber daily;Understanding of distribution of calorie intake throughout the day with the consumption of 4-5 meals/snacks    Improve shortness of breath with ADL's Yes    Intervention Provide education, individualized exercise plan and daily activity instruction to help decrease symptoms of SOB with activities of daily living.    Expected Outcomes Short Term: Improve cardiorespiratory fitness to achieve a reduction of symptoms when performing ADLs;Long Term: Be able to perform more ADLs without symptoms or delay the onset of symptoms    Heart Failure Yes    Intervention Provide a combined exercise and nutrition program that is supplemented with education, support and counseling about heart failure. Directed toward relieving symptoms such as shortness of breath, decreased exercise tolerance, and extremity edema.    Expected Outcomes Improve functional capacity of life;Short term: Attendance in program 2-3 days a week with increased exercise capacity. Reported lower sodium intake. Reported increased fruit and vegetable intake. Reports medication compliance.;Short term: Daily weights obtained and reported for increase. Utilizing diuretic protocols set by physician.;Long term: Adoption of self-care skills and reduction of barriers for early signs and symptoms recognition and intervention leading to self-care maintenance.    Hypertension Yes    Intervention Provide education on lifestyle modifcations including regular physical activity/exercise, weight management,  moderate sodium restriction and increased consumption of fresh fruit, vegetables, and low fat dairy, alcohol moderation, and smoking cessation.;Monitor prescription use compliance.    Expected Outcomes Short Term: Continued assessment and intervention until BP is < 140/9034mG in hypertensive participants. < 130/34m47m in hypertensive participants with diabetes, heart failure  or chronic kidney disease.;Long Term: Maintenance of blood pressure at goal levels.    Lipids Yes    Intervention Provide education and support for participant on nutrition & aerobic/resistive exercise along with prescribed medications to achieve LDL <32m, HDL >489m    Expected Outcomes Short Term: Participant states understanding of desired cholesterol values and is compliant with medications prescribed. Participant is following exercise prescription and nutrition guidelines.;Long Term: Cholesterol controlled with medications as prescribed, with individualized exercise RX and with personalized nutrition plan. Value goals: LDL < 7053mHDL > 40 mg.             Education:Diabetes - Individual verbal and written instruction to review signs/symptoms of diabetes, desired ranges of glucose level fasting, after meals and with exercise. Acknowledge that pre and post exercise glucose checks will be done for 3 sessions at entry of program.   Know Your Numbers and Heart Failure: - Group verbal and visual instruction to discuss disease risk factors for cardiac and pulmonary disease and treatment options.  Reviews associated critical values for Overweight/Obesity, Hypertension, Cholesterol, and Diabetes.  Discusses basics of heart failure: signs/symptoms and treatments.  Introduces Heart Failure Zone chart for action plan for heart failure.  Written material given at graduation. Flowsheet Row [facility]  Education need identified 02/12/22  Date 05/10/22  Educator [facility]   Instruction Review Code 1- Verbalizes Understanding       Core Components/Risk Factors/Patient Goals Review:   Goals and Risk Factor Review     Row Name 02/27/22 1021 04/05/22 1014 04/12/22 1008 05/17/22 0958 06/19/22 1030     Core Components/Risk Factors/Patient Goals Review   Personal Goals Review Weight Management/Obesity;Heart Failure;Develop more efficient breathing techniques such as purse lipped breathing and diaphragmatic breathing and practicing self-pacing with activity.;Hypertension;Lipids;Improve shortness of breath with ADL's Weight Management/Obesity;Heart Failure;Develop more efficient breathing techniques such as purse lipped breathing and diaphragmatic breathing and practicing self-pacing with activity.;Hypertension;Lipids;Improve shortness of breath with ADL's Weight Management/Obesity;Heart Failure;Develop more efficient breathing techniques such as purse lipped breathing and diaphragmatic breathing and practicing self-pacing with activity.;Hypertension;Lipids;Improve shortness of breath with ADL's Weight Management/Obesity;Heart Failure;Develop more efficient breathing techniques such as purse lipped breathing and diaphragmatic breathing and practicing self-pacing with activity.;Hypertension;Lipids;Improve shortness of breath with ADL's Weight Management/Obesity;Heart Failure;Develop more efficient breathing techniques such as purse lipped breathing and diaphragmatic breathing and practicing self-pacing with activity.;Hypertension;Lipids;Improve shortness of breath with ADL's   Review Patient reports that she monitors BP at home and it varies depending on the day. She does report taking all meds as prescribed. He weight has been consistent. Robin Ortiz losing about 8 pounds before coming here to her medications, but has now been weight stable. She reports thats she has been short of breath, but will pause to catch her breath. She reports continuing to use PLB to help control  shortness of breath during exercise and reports using it frequently. She has a pulse oximeter at home as well. She checks her BP daily - 124/60 this morning at home, it will get to be 140248G/500Br systolic BP, but her HR is on the lower end. She reports not seeing her cardiologist until the end of August and has an echocardiogram at the beginning of August. [place] that has now been weight stable. She reports thats she has been short of breath, but will pause to catch her breath. She reports continuing to use PLB to help control shortness of breath during exercise and reports using  it frequently. She has a pulse oximeter at home as well. She checks her BP daily -  122/62 this morning at rehb, she reports that is will rang from 416/384T for systolic BP, but her HR is on the lower end. She reports not seeing her cardiologist until the end of August and has an echocardiogram at the beginning of August. She continues to take her medications as prescribed, she reports no issues aside from chronic nausea which she has reported to her doctors. Encouraged her to talk with her doctor about getting tired more easily and feeling weak. Vauda reports that her weight stable has been stable. She checks her BP daily -  120/72 this morning at rehab, she reports that is will range from 364/680H for systolic BP, but her HR is on the lower end. She reports she will see her cardiologist next week and has been referred to see Dr. Caryl Comes about her low HR and EKG in October. She continues to take her medications as prescribed; she is off of amlodopine and her nausea has left! She continues to have fatigue; she has let her MD know and he ordered an EKG and she will follow-up with him next week when she goes to the MD. Robin Ortiz states that she is happy with where her weight is at. She has lost between 7-10 lbs and would like to maintain her current weight. She checks her BP at home and states that it does fluctuate but stays  within normal ranges. She has also been taking her meds as prescribed. We went over PLB again and she was encouraged to practice that technique.   Expected Outcomes Short: contiue to monitor BP and weight at home. Long: continue healthy behaviors to control cardiac risk factors. Short: contiue to monitor BP and weight at home. Long: continue healthy behaviors to control cardiac risk factors. Short: contiue to monitor BP and weight at home, soeak with doctor. Long: continue healthy behaviors to control cardiac risk factors. Short: contiue to monitor BP and weight at home, follow up with MD about fatigue Long: continue healthy behaviors to control cardiac risk factors. Short: contiue to monitor BP and weight at home. Long: continue to monitor lifestlye risk factors.            Core Components/Risk Factors/Patient Goals at Discharge (Final Review):   Goals and Risk Factor Review - 06/19/22 1030       Core Components/Risk Factors/Patient Goals Review   Personal Goals Review Weight Management/Obesity;Heart Failure;Develop more efficient breathing techniques such as purse lipped breathing and diaphragmatic breathing and practicing self-pacing with activity.;Hypertension;Lipids;Improve shortness of breath with ADL's    Review Wynette states that she is happy with where her weight is at. She has lost between 7-10 lbs and would like to maintain her current weight. She checks her BP at home and states that it does fluctuate but stays within normal ranges. She has also been taking her meds as prescribed. We went over PLB again and she was encouraged to practice that technique.    Expected Outcomes Short: contiue to monitor BP and weight at home. Long: continue to monitor lifestlye risk factors.             ITP Comments:  ITP Comments     Row Name 02/01/22 0907 02/12/22 1507 02/13/22 0952 02/14/22 0842 02/22/22 1149   ITP Comments Virtual Visit completed. Patient informed on EP and RD appointment  and 6 Minute walk test. Patient also informed of patient health questionnaires  on My Chart. Patient Verbalizes understanding. Visit diagnosis can be found in [facility] 01/11/2022. Completed 6MWT and gym orientation. Initial ITP created and sent for review to Dr. Zetta Bills, Medical Director. First full day of exercise!  Patient was oriented to gym and equipment including functions, settings, policies, and procedures.  Patient's individual exercise prescription and treatment plan were reviewed.  All starting workloads were established based on the results of the 6 minute walk test done at initial orientation visit.  The plan for exercise progression was also introduced and progression will be customized based on patient's performance and goals. 30 Day review completed. Medical Director ITP review done, changes made as directed, and signed approval by Medical Director.  NEW Completed initial RD consultation    Row Name 03/14/22 1253 04/11/22 0936 05/09/22 0735 06/06/22 1327 06/28/22 1042   ITP Comments 30 Day review completed. Medical Director ITP review done, changes made as directed, and signed approval by Medical Director. 30 Day review completed. Medical Director ITP review done, changes made as directed, and signed approval by Medical Director. 30 Day review completed. Medical Director ITP review done, changes made as directed, and signed approval by Medical Director. 30 Day review completed. Medical Director ITP review done, changes made as directed, and signed approval by Medical Director. Treazure graduated today from  rehab with 36 sessions completed.  Details of the patient's exercise prescription and what She needs to do in order to continue the prescription and progress were discussed with patient.  Patient was given a copy of prescription and goals.  Patient verbalized understanding.  Wileen plans to continue to exercise by walking and continuing home PT exercises.            Comments: Discharge  ITP

## 2022-06-28 NOTE — Progress Notes (Signed)
Daily Session Note  Patient Details  Name: Robin Ortiz MRN: [id number] Date of Birth: 08/19/1935 Referring Provider:   Flowsheet Row Pulmonary Rehab from 02/12/2022 in [facility]  Referring Provider Gollan       Encounter Date: 06/28/2022  Check In:  Session Check In - 06/28/22 0959       Check-In   Supervising physician immediately available to respond to emergencies See telemetry face sheet for immediately available ER MD    Location [facility]-Cardiac & Pulmonary Rehab    Staff Present Earlean Shawl, BS, ACSM CEP, Exercise Physiologist;Joseph Rosebud Poles, RN, [place]    Virtual Visit No    Medication changes reported     No    Fall or balance concerns reported    No    Warm-up and Cool-down Performed on first and last piece of equipment    Resistance Training Performed Yes    VAD Patient? No    PAD/SET Patient? No      Pain Assessment   Currently in Pain? No/denies                Social History   Tobacco Use  Smoking Status Never  Smokeless Tobacco Never    Goals Met:  Independence with exercise equipment Exercise tolerated well No report of concerns or symptoms today Strength training completed today  Goals Unmet:  Not Applicable  Comments:  Lilianne graduated today from  rehab with 36 sessions completed.  Details of the patient's exercise prescription and what She needs to do in order to continue the prescription and progress were discussed with patient.  Patient was given a copy of prescription and goals.  Patient verbalized understanding.  Dajanay plans to continue to exercise by walking and continuing home PT exercises.     Dr. Emily Filbert is Medical Director for [facility].  Dr. Ottie Glazier is Medical Director for [facility] Pulmonary Rehabilitation.

## 2022-06-29 NOTE — Telephone Encounter (Signed)
Minna Merritts, MD  Cv [facility] Triage 14 hours ago (5:54 PM)    Somewhat labile blood pressures but trending lower  Suspect that less Lasix and slightly increased fluid intake may help with the low pressures  Would recommend she continue to monitor for now  Thx  Tgollan     Dr. Donivan Scull recommendation given to the patient on 06/25/22 by Nira Conn, [place]

## 2022-07-02 ENCOUNTER — Other Ambulatory Visit: Payer: Self-pay | Admitting: Cardiovascular Disease

## 2022-07-02 ENCOUNTER — Ambulatory Visit
Admission: RE | Admit: 2022-07-02 | Discharge: 2022-07-02 | Disposition: A | Discharge status: Home / Self Care | Payer: Medicare Other | Source: Ambulatory Visit | Attending: Internal Medicine | Admitting: Internal Medicine

## 2022-07-02 DIAGNOSIS — Z1231 Encounter for screening mammogram for malignant neoplasm of breast: Secondary | ICD-10-CM | POA: Insufficient documentation

## 2022-07-12 ENCOUNTER — Institutional Professional Consult (permissible substitution): Payer: Medicare Other | Admitting: Internal Medicine

## 2022-08-20 NOTE — Progress Notes (Unsigned)
Cardiology Office Note  Date:  08/22/2022   ID:  Robin Ortiz, DOB [birth year], MRN [id number]  PCP:  Robin Shaggy, MD   Chief Complaint  Patient presents with   1 month follow up     "Doing much better off the amiodarone/amlodipine; doesn't feel nausea anymore. Medications reviewed by the patient verbally.     HPI:  [name] is a pleasant 87 year-old woman with a history of  hypertension,  hyperlipidemia,  remote history of nonischemic cardiomyopathy with initial ejection fraction 20-30% in 2002 that has improved to 50-55% on echocardiogram in March 2009,  status post bilateral knee replacement  Anxiety/gets stressed, chronic fatigue  CHA2DS2-VASc of 5. Ejection fraction 35 to 40% in atrial fibrillation Jan 2023 Cardioversion December 14, 2021 for fib flutter, on amiodarone ejection fraction up to 45 to 50% in 04/18/2022 Of beta-blockers in the setting of junctional bradycardia who presents for routine follow up of her cardiomyopathy, atrial fib/flutter  Last seen in clinic by myself July 2023 On that visit, documented Junctional bradycardia rate 39 bpm Carvedilol and amiodarone held  In follow-up today she reports that she feels much better on current medications Denies shortness of breath, no leg swelling, no orthostasis symptoms Labile blood pressure but in general relatively well controlled Nausea better off amiodarone  Currently taking Lasix 20 two times a week  no edema, no ABD or leg swelling On higher dose Lasix had renal dysfunction  No regular exercise program apart from cardiac rehab Reports taking her Sherryll Burger,  Reports that she feels more like herself again  EKG personally reviewed by myself on todays visit Sinus bradycardia rate 59 bpm, PAC, junctional escape beat  Other past medical history reviewed Stress at home, husband with dementia  Echocardiogram April 18, 2022  Ejection fraction up to 45 to 50%, previously 35 to 40%  Seen by one of our  providers January 11, 2022 Bradycardia noted,carvedilol dosing decreased down to 6.25 twice daily, amlodipine held  x-ray January 11, 2022, resolution of pleural effusions  Prior history of GERD, arthritic pain left leg, neuropathy  Seen by one of our providers October 17, 2021, in atrial fibrillation  Prior echocardiogram history EF previous as low as 20 to 30% in 2002  45 to 50% in December 2016.  Echo 10/18/2021 ef 35 to 40% Moderate mitral valve  regurgitation.  Left atrial size was severely dilated.  Tricuspid valve regurgitation is mild to moderate.  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  PSH:    Past Surgical History:  Procedure Laterality Date   CARDIOVERSION N/A 12/14/2021   Procedure: CARDIOVERSION;  Surgeon: Antonieta Iba, MD;  Location: [facility] ORS;  Service: Cardiovascular;  Laterality: N/A;   ROTATOR CUFF REPAIR     left shoulder   ruptured tendon right foot Right    surgical repair   TOTAL KNEE ARTHROPLASTY Bilateral     Current Outpatient Medications  Medication Sig Dispense Refill   apixaban (ELIQUIS) 5 MG TABS tablet Take 1 tablet (5 mg total) by mouth 2 (two) times daily. 60 tablet 5   Calcium-Vitamin D 600-200 MG-UNIT per tablet Take 1 tablet by mouth daily.     celecoxib (CELEBREX) 200 MG capsule Take 200 mg by mouth daily as needed for mild pain.     diclofenac Sodium (VOLTAREN) 1 %  GEL Apply 2 g topically daily as needed (Arthritis).     diphenhydrAMINE-APAP, sleep, (TYLENOL PM EXTRA STRENGTH PO) Take 1 tablet by mouth at bedtime as needed (sleep).     furosemide (LASIX) 40 MG tablet Take 0.5 tablet (20 mg) by mouth on Mondays & Fridays. 30 tablet 3   Misc Natural Products (OSTEO BI-FLEX ADV JOINT SHIELD PO) Take  1 tablet by mouth daily.     Multiple Vitamin (MULTIVITAMIN) tablet Take 1 tablet by mouth daily. Alive     raloxifene (EVISTA) 60 MG tablet Take 60 mg by mouth daily. (Patient not taking: Reported on 05/14/2022)     sacubitril-valsartan (ENTRESTO) 97-103 MG Take 1 tablet by mouth 2 (two) times daily. Please keep upcoming appointment for future refills. Thank you. 180 tablet 3   simvastatin (ZOCOR) 20 MG tablet Take 1 tablet (20 mg total) by mouth at bedtime. 90 tablet 3   spironolactone (ALDACTONE) 25 MG tablet Take 1 tablet (25 mg total) by mouth daily. 90 tablet 3   No current facility-administered medications for this visit.    Allergies:   Amiodarone   Social History:  The patient  reports that she has never smoked. She has never used smokeless tobacco. She reports current alcohol use of about 2.0 - 3.0 standard drinks of alcohol per week. She reports that she does not use drugs.   Family History:   family history includes Breast cancer in her cousin; Breast cancer (age of onset: 53) in her maternal aunt; Breast cancer (age of onset: 55) in her maternal grandmother; Heart disease in her father.   Review of Systems: Review of Systems  Constitutional: Negative.   HENT: Negative.    Respiratory: Negative.    Cardiovascular: Negative.   Gastrointestinal: Negative.   Musculoskeletal: Negative.   Neurological: Negative.   All other systems reviewed and are negative.  PHYSICAL EXAM: VS:  BP 126/70 (BP Location: Left Arm, Patient Position: Sitting, Cuff Size: Normal)   Pulse (!) 59   Ht 5' 5.5" (1.664 m)   Wt 137 lb 8 oz (62.4 kg)   SpO2 98%   BMI 22.53 kg/m  , BMI Body mass index is 22.53 kg/m.  Constitutional:  oriented to person, place, and time. No distress.  HENT:  Head: Grossly normal Eyes:  no discharge. No scleral icterus.  Neck: No JVD, no carotid bruits  Cardiovascular: Regular rate and rhythm, no murmurs appreciated Pulmonary/Chest: Clear to auscultation bilaterally,  no wheezes or rails Abdominal: Soft.  no distension.  no tenderness.  Musculoskeletal: Normal range of motion Neurological:  normal muscle tone. Coordination normal. No atrophy Skin: Skin warm and dry Psychiatric: normal affect, pleasant  Recent Labs: 10/17/2021: Magnesium 2.2; TSH 1.850 12/12/2021: Hemoglobin 11.8; Platelets 242 12/19/2021: ALT 22 06/15/2022: BUN 33; Creatinine, Ser 1.47; Potassium 4.3; Sodium 139    Lipid Panel No results found for: "CHOL", "HDL", "LDLCALC", "TRIG"    Wt Readings from Last 3 Encounters:  08/22/22 137 lb 8 oz (62.4 kg)  06/12/22 136 lb 3.2 oz (61.8 kg)  05/22/22 139 lb (63 kg)     ASSESSMENT AND PLAN:  Persistent atrial fibrillation Prior successful cardioversion, maintaining normal sinus rhythm All beta-blocker and amiodarone held for nausea, junctional bradycardia Maintaining NSR, Off b-blocker For recurrent atrial fibrillation would likely need help from EP  Chronic diastolic and systolic CHF continue Lasix 20 mg 2 days a week Euvolemic on today's visit  Mixed hyperlipidemia Continue statin, simvaststin 20 daily  Essential hypertension Labile pressure  at home, 100 to 130 No med changes  Chronic fatigue/anxiety Exacerbated by bradycardia  Anxiety Stable, managed by primary care  Cardiomyopathy Long history of cardiomyopathy dating back to 2002 Continue Entresto 97/103 mg twice daily Off carvedilol secondary to bradycardia On spironolactone 25 daily  euvolemic   Total encounter time more than 30  minutes  Greater than 50% was spent in counseling and coordination of care with the patient   Orders Placed This Encounter  Procedures   EKG 12-Lead     Signed, Dossie Arbour, M.D., Ph.D. 08/22/2022  [facility] [place], [place] [phone]

## 2022-08-22 ENCOUNTER — Ambulatory Visit: Payer: Medicare Other | Attending: Cardiovascular Disease | Admitting: Cardiovascular Disease

## 2022-08-22 ENCOUNTER — Encounter: Payer: Self-pay | Admitting: Cardiovascular Disease

## 2022-08-22 VITALS — BP 126/70 | HR 59 | Ht 65.5 in | Wt 137.5 lb

## 2022-08-22 DIAGNOSIS — I5022 Chronic systolic (congestive) heart failure: Secondary | ICD-10-CM

## 2022-08-22 DIAGNOSIS — I10 Essential (primary) hypertension: Secondary | ICD-10-CM

## 2022-08-22 DIAGNOSIS — I4819 Other persistent atrial fibrillation: Secondary | ICD-10-CM | POA: Diagnosis present

## 2022-08-22 DIAGNOSIS — I428 Other cardiomyopathies: Secondary | ICD-10-CM

## 2022-08-22 DIAGNOSIS — E782 Mixed hyperlipidemia: Secondary | ICD-10-CM

## 2022-08-22 DIAGNOSIS — R001 Bradycardia, unspecified: Secondary | ICD-10-CM

## 2022-08-22 MED ORDER — SIMVASTATIN 20 MG PO TABS: 20.0000 mg | ORAL_TABLET | Freq: Every day | ORAL | 3 refills | Status: DC

## 2022-08-22 MED ORDER — ENTRESTO 97-103 MG PO TABS: 1.0000 | ORAL_TABLET | Freq: Two times a day (BID) | ORAL | 3 refills | Status: DC

## 2022-08-22 MED ORDER — SPIRONOLACTONE 25 MG PO TABS: 25.0000 mg | ORAL_TABLET | Freq: Every day | ORAL | 3 refills | Status: DC

## 2022-08-22 NOTE — Patient Instructions (Signed)
Medication Instructions:  No changes  If you need a refill on your cardiac medications before your next appointment, please call your pharmacy.    Lab work: No new labs needed   Testing/Procedures: No new testing needed   Follow-Up: At [facility], you and your health needs are our priority.  As part of our continuing mission to provide you with exceptional heart care, we have created designated Provider Care Teams.  These Care Teams include your primary Cardiologist (physician) and Advanced Practice Providers (APPs -  Physician Assistants and Nurse Practitioners) who all work together to provide you with the care you need, when you need it.  You will need a follow up appointment in 6 months  Providers on your designated Care Team:   Christopher Berge, NP Ryan Dunn, PA-C Cadence Furth, PA-C  COVID-19 Vaccine Information can be found at: [email or web address] For questions related to vaccine distribution or appointments, please email [email or web address] or call [phone].   

## 2022-11-27 ENCOUNTER — Other Ambulatory Visit: Payer: Self-pay | Admitting: Cardiovascular Disease

## 2022-11-27 NOTE — Telephone Encounter (Signed)
Prescription refill request for Eliquis received. Indication: afib  Last office visit:Gollan, 08/22/2022 Scr: 1.47, 06/15/2022 Age: 88  Weight: 62.4 kg

## 2022-11-27 NOTE — Telephone Encounter (Signed)
Refill request

## 2023-03-03 NOTE — Progress Notes (Unsigned)
Cardiology Office Note  Date:  03/04/2023   ID:  Robin Ortiz, DOB 05/21/1935, MRN [id number]  PCP:  Jaclyn Shaggy, MD   Chief Complaint  Patient presents with   6 month follow up     "Doing well." Medications reviewed by the patient verbally.     HPI:  Robin Ortiz is a pleasant 88 year-old woman with a history of  hypertension,  hyperlipidemia,  remote history of nonischemic cardiomyopathy with initial ejection fraction 20-30% in 2002 that has improved to 50-55% on echocardiogram in March 2009,  status post bilateral knee replacement  Anxiety/gets stressed, chronic fatigue  CHA2DS2-VASc of 5. Ejection fraction 35 to 40% in atrial fibrillation Jan 2023 Cardioversion December 14, 2021 for fib flutter, on amiodarone ejection fraction up to 45 to 50% in 04/18/2022 Of beta-blockers in the setting of junctional bradycardia who presents for routine follow up of her cardiomyopathy, atrial fib/flutter  Last seen in clinic by myself November 2023 On that visit, documented Junctional bradycardia rate 39 bpm Carvedilol and amiodarone held  Echocardiogram July 2023 EF 45 to 50% up from 35 to 40% in January 2023  Feeling well, Felt unwell April 2024, tired, slept a lot, lost taste Husband with covid April 2024  Feeling well,  BP well controlled , high 90s to 130 systolic  On entresto, aldactone,  Lasix 1/2 pill  2x a week Denies significant lower extremity edema  Does some activity in the garden, pulling vines Hurt her left hip  EKG personally reviewed by myself on todays visit Nsr rate 60 bpm no ST or T wave changes  In follow-up today she reports that she feels much better on current medications Denies shortness of breath, no leg swelling, no orthostasis symptoms Labile blood pressure but in general relatively well controlled Nausea better off amiodarone  Currently taking Lasix 20 two times a week  no edema, no ABD or leg swelling On higher dose Lasix had renal  dysfunction  No regular exercise program apart from cardiac rehab Reports taking her Sherryll Burger,  Reports that she feels more like herself again  EKG personally reviewed by myself on todays visit Sinus bradycardia rate 59 bpm, PAC, junctional escape beat  Other past medical history reviewed Stress at home, husband with dementia  Echocardiogram April 18, 2022  Ejection fraction up to 45 to 50%, previously 35 to 40%  Seen by one of our providers January 11, 2022 Bradycardia noted,carvedilol dosing decreased down to 6.25 twice daily, amlodipine held  x-ray January 11, 2022, resolution of pleural effusions  Prior history of GERD, arthritic pain left leg, neuropathy  Seen by one of our providers October 17, 2021, in atrial fibrillation  Prior echocardiogram history EF previous as low as 20 to 30% in 2002  45 to 50% in December 2016.  Echo 10/18/2021 ef 35 to 40% Moderate mitral valve  regurgitation.  Left atrial size was severely dilated.  Tricuspid valve regurgitation is mild to moderate.  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  PSH:    Past Surgical History:  Procedure Laterality Date   CARDIOVERSION N/A 12/14/2021   Procedure: CARDIOVERSION;  Surgeon: Antonieta Iba, MD;  Location: [facility] ORS;  Service: Cardiovascular;  Laterality: N/A;   ROTATOR CUFF REPAIR     left shoulder   ruptured tendon right foot Right  surgical repair   TOTAL KNEE ARTHROPLASTY Bilateral     Current Outpatient Medications  Medication Sig Dispense Refill   Calcium-Vitamin D 600-200 MG-UNIT per tablet Take 1 tablet by mouth daily.     celecoxib (CELEBREX) 200 MG capsule Take 200 mg by mouth daily as needed for mild pain.     diclofenac Sodium (VOLTAREN) 1  % GEL Apply 2 g topically daily as needed (Arthritis).     diphenhydrAMINE-APAP, sleep, (TYLENOL PM EXTRA STRENGTH PO) Take 1 tablet by mouth at bedtime as needed (sleep).     ELIQUIS 5 MG TABS tablet Take 1 tablet (5 mg total) by mouth 2 (two) times daily. 60 tablet 5   furosemide (LASIX) 40 MG tablet Take 0.5 tablet (20 mg) by mouth on Mondays & Fridays. 30 tablet 3   Misc Natural Products (OSTEO BI-FLEX ADV JOINT SHIELD PO) Take 1 tablet by mouth daily.     Multiple Vitamin (MULTIVITAMIN) tablet Take 1 tablet by mouth daily. Alive     sacubitril-valsartan (ENTRESTO) 97-103 MG Take 1 tablet by mouth 2 (two) times daily. Please keep upcoming appointment for future refills. Thank you. 180 tablet 3   simvastatin (ZOCOR) 20 MG tablet Take 1 tablet (20 mg total) by mouth at bedtime. 90 tablet 3   spironolactone (ALDACTONE) 25 MG tablet Take 1 tablet (25 mg total) by mouth daily. 90 tablet 3   raloxifene (EVISTA) 60 MG tablet Take 60 mg by mouth daily. (Patient not taking: Reported on 03/04/2023)     No current facility-administered medications for this visit.    Allergies:   Amiodarone   Social History:  The patient  reports that she has never smoked. She has never used smokeless tobacco. She reports current alcohol use of about 2.0 - 3.0 standard drinks of alcohol per week. She reports that she does not use drugs.   Family History:   family history includes Breast cancer in her cousin; Breast cancer (age of onset: 12) in her maternal aunt; Breast cancer (age of onset: 5) in her maternal grandmother; Heart disease in her father.   Review of Systems: Review of Systems  Constitutional: Negative.   HENT: Negative.    Respiratory: Negative.    Cardiovascular: Negative.   Gastrointestinal: Negative.   Musculoskeletal: Negative.   Neurological: Negative.   All other systems reviewed and are negative.  PHYSICAL EXAM: VS:  BP (!) 110/56 (BP Location: Left Arm, Patient Position: Sitting, Cuff  Size: Normal)   Pulse 60   Ht 5' 5.5" (1.664 m)   Wt 141 lb 6 oz (64.1 kg)   SpO2 97%   BMI 23.17 kg/m  , BMI Body mass index is 23.17 kg/m.  Constitutional:  oriented to person, place, and time. No distress.  HENT:  Head: Grossly normal Eyes:  no discharge. No scleral icterus.  Neck: No JVD, no carotid bruits  Cardiovascular: Regular rate and rhythm, no murmurs appreciated Pulmonary/Chest: Clear to auscultation bilaterally, no wheezes or rails Abdominal: Soft.  no distension.  no tenderness.  Musculoskeletal: Normal range of motion Neurological:  normal muscle tone. Coordination normal. No atrophy Skin: Skin warm and dry Psychiatric: normal affect, pleasant  Recent Labs: 06/15/2022: BUN 33; Creatinine, Ser 1.47; Potassium 4.3; Sodium 139    Lipid Panel No results found for: "CHOL", "HDL", "LDLCALC", "TRIG"    Wt Readings from Last 3 Encounters:  03/04/23 141 lb 6 oz (64.1 kg)  08/22/22 137 lb 8 oz (62.4 kg)  06/12/22 136 lb 3.2 oz (  61.8 kg)     ASSESSMENT AND PLAN:  Persistent atrial fibrillation Prior successful cardioversion, maintaining normal sinus rhythm beta-blocker and amiodarone held for nausea, junctional bradycardia Maintaining NSR,Off b-blocker For recurrent atrial fibrillation would likely need help from EP Denies tachypalpitations concerning for paroxysmal arrhythmia  Chronic diastolic and systolic CHF continue Lasix 20 mg 2 days a week Continue Entresto, spironolactone Reports that she feels well Could consider SGLT2 inhibitor  Mixed hyperlipidemia Continue statin, simvaststin 20 daily  Essential hypertension Labile pressure at home, 100 to 130 No med changes  Chronic fatigue/anxiety Exacerbated by bradycardia  Anxiety Stable, managed by primary care  Cardiomyopathy Long history of cardiomyopathy dating back to 2002 Continue Entresto 97/103 mg twice daily Off carvedilol secondary to bradycardia On spironolactone 25 daily     Total  encounter time more than 30  minutes  Greater than 50% was spent in counseling and coordination of care with the patient   No orders of the defined types were placed in this encounter.    Signed, Dossie Arbour, M.D., Ph.D. 03/04/2023  [facility] [place], [place] [phone]

## 2023-03-04 ENCOUNTER — Ambulatory Visit: Payer: Medicare Other | Attending: Cardiovascular Disease | Admitting: Cardiovascular Disease

## 2023-03-04 ENCOUNTER — Encounter: Payer: Self-pay | Admitting: Cardiovascular Disease

## 2023-03-04 VITALS — BP 110/56 | HR 60 | Ht 65.5 in | Wt 141.4 lb

## 2023-03-04 DIAGNOSIS — R0602 Shortness of breath: Secondary | ICD-10-CM | POA: Insufficient documentation

## 2023-03-04 DIAGNOSIS — I10 Essential (primary) hypertension: Secondary | ICD-10-CM | POA: Diagnosis present

## 2023-03-04 DIAGNOSIS — R001 Bradycardia, unspecified: Secondary | ICD-10-CM | POA: Insufficient documentation

## 2023-03-04 DIAGNOSIS — I428 Other cardiomyopathies: Secondary | ICD-10-CM

## 2023-03-04 DIAGNOSIS — I4819 Other persistent atrial fibrillation: Secondary | ICD-10-CM

## 2023-03-04 DIAGNOSIS — E782 Mixed hyperlipidemia: Secondary | ICD-10-CM | POA: Diagnosis present

## 2023-03-04 DIAGNOSIS — I5022 Chronic systolic (congestive) heart failure: Secondary | ICD-10-CM | POA: Insufficient documentation

## 2023-03-04 MED ORDER — ENTRESTO 97-103 MG PO TABS: 1.0000 | ORAL_TABLET | Freq: Two times a day (BID) | ORAL | 3 refills | Status: DC

## 2023-03-04 MED ORDER — APIXABAN 5 MG PO TABS: 5.0000 mg | ORAL_TABLET | Freq: Two times a day (BID) | ORAL | 3 refills | Status: DC

## 2023-03-04 MED ORDER — SIMVASTATIN 20 MG PO TABS: 20.0000 mg | ORAL_TABLET | Freq: Every day | ORAL | 3 refills | Status: DC

## 2023-03-04 NOTE — Patient Instructions (Addendum)
Medication Instructions:  No changes  If you need a refill on your cardiac medications before your next appointment, please call your pharmacy.    Lab work: No new labs needed   Testing/Procedures: No new testing needed   Follow-Up: At [facility], you and your health needs are our priority.  As part of our continuing mission to provide you with exceptional heart care, we have created designated Provider Care Teams.  These Care Teams include your primary Cardiologist (physician) and Advanced Practice Providers (APPs -  Physician Assistants and Nurse Practitioners) who all work together to provide you with the care you need, when you need it.  You will need a follow up appointment in 6 months  Providers on your designated Care Team:   Christopher Berge, NP Ryan Dunn, PA-C Cadence Furth, PA-C  COVID-19 Vaccine Information can be found at: [email or web address] For questions related to vaccine distribution or appointments, please email [email or web address] or call [phone].   

## 2023-03-19 LAB — LAB REPORT - SCANNED: EGFR: 59

## 2023-05-03 ENCOUNTER — Telehealth: Payer: Self-pay | Admitting: Cardiovascular Disease

## 2023-05-03 NOTE — Telephone Encounter (Signed)
Pt advised to have surgeon office send pre-op clearance for review. Pt verbalized understanding.

## 2023-05-03 NOTE — Telephone Encounter (Signed)
Please advise holding Eliquis prior to right shoulder arthroscopy with DCE and mini open rotator cuff repair scheduled for 05/13/2023.  Thank you!  DW

## 2023-05-03 NOTE — Telephone Encounter (Signed)
I spoke to [place] at [facility]. She stated that she will send Dr. Martha Clan a message letting him pt no longer wants surgery.

## 2023-05-03 NOTE — Telephone Encounter (Signed)
Patient is calling with questions regarding their heart. Patient needs a procedure for their arm due to a recent fall. Patient is concern if might not be okay for her because of the anesthesia. Please advise.

## 2023-05-03 NOTE — Telephone Encounter (Signed)
Pre-op Team,   I spoke with patient and she has decided that she is going to hold off on surgery for now. Please contact the requesting office to let them know.   Pharmacy,  Please disregard previous request for recommendations to hold Eliquis.   Thank you!  DW

## 2023-05-03 NOTE — Telephone Encounter (Signed)
   [facility] Pre-operative Risk Assessment    Request for surgical clearance:  What type of surgery is being performed?  Right Shoulder Arthroscope with DCE & a mini open Rotator Cuff Repair  When is this surgery scheduled?  05/13/23  What type of clearance is required (medical clearance vs. Pharmacy clearance to hold med vs. Both)?  Both   Are there any medications that need to be held prior to surgery and how long? Advise on Eliquis    Practice name and name of physician performing surgery?  [facility]  Dr. Juanell Fairly   What is your office phone number? [phone]    7.   What is your office fax number? [phone]  8.   Anesthesia type (None, local, MAC, general) ?  General    Rolly Pancake 05/03/2023, 3:31 PM

## 2023-05-29 ENCOUNTER — Other Ambulatory Visit: Payer: Self-pay | Admitting: Cardiovascular Disease

## 2023-06-04 ENCOUNTER — Other Ambulatory Visit: Payer: Self-pay | Admitting: Internal Medicine

## 2023-06-04 DIAGNOSIS — Z1231 Encounter for screening mammogram for malignant neoplasm of breast: Secondary | ICD-10-CM

## 2023-06-21 ENCOUNTER — Telehealth: Payer: Self-pay | Admitting: Cardiovascular Disease

## 2023-06-21 NOTE — Telephone Encounter (Signed)
Returned the call to the patient. She stated that she has advanced arthritis in her hip. She was advised previously that it was fine to take Celebrex. She was taking it rarely.  She stated that the pain has gotten worse lately. She would like to know if she can take the Celebrex daily or more often from a cardiac standpoint and with her Eliquis.

## 2023-06-21 NOTE — Telephone Encounter (Signed)
Pt c/o medication issue:  1. Name of Medication:  Celebrex   2. How are you currently taking this medication (dosage and times per day)?   3. Are you having a reaction (difficulty breathing--STAT)?   4. What is your medication issue?   Patient states she has advanced arthritis in her left hip and has experienced increased pain recently. She states Dr. Mariah Milling advised that Celebrex is alright to take on occasion. She states her orthopedic specialist advised to check with Dr. Mariah Milling about taking it on a more consistent basis to relieve pain. Will this interfere with Eliquis? Please advise.

## 2023-06-23 ENCOUNTER — Encounter: Payer: Self-pay | Admitting: Cardiovascular Disease

## 2023-06-24 NOTE — Telephone Encounter (Signed)
Called patient and notified the patient of the following from Dr. Mariah Milling.  Typically we do not recommend taking Celebrex daily in addition to Eliquis daily as it can increase risk of bleeding. Both Celebrex and Eliquis can thin the blood, which can increase the risk of bleeding. This includes bleeding in the digestive system, brain, or other areas. Bleeding may take longer to stop, and could be life threatening.   Thx Tim Mariah Milling  Patient verbalizes understanding.

## 2023-07-05 ENCOUNTER — Ambulatory Visit
Admission: RE | Admit: 2023-07-05 | Discharge: 2023-07-05 | Disposition: A | Discharge status: Home / Self Care | Payer: Medicare Other | Source: Ambulatory Visit | Attending: Internal Medicine | Admitting: Internal Medicine

## 2023-07-05 DIAGNOSIS — Z1231 Encounter for screening mammogram for malignant neoplasm of breast: Secondary | ICD-10-CM | POA: Insufficient documentation

## 2023-07-10 ENCOUNTER — Other Ambulatory Visit (INDEPENDENT_AMBULATORY_CARE_PROVIDER_SITE_OTHER): Payer: Medicare Other

## 2023-07-10 ENCOUNTER — Encounter: Payer: Self-pay | Admitting: Orthopaedic Surgery

## 2023-07-10 ENCOUNTER — Ambulatory Visit: Payer: Medicare Other | Admitting: Orthopaedic Surgery

## 2023-07-10 VITALS — Ht 65.0 in | Wt 144.2 lb

## 2023-07-10 DIAGNOSIS — M25552 Pain in left hip: Secondary | ICD-10-CM

## 2023-07-10 DIAGNOSIS — M1612 Unilateral primary osteoarthritis, left hip: Secondary | ICD-10-CM | POA: Insufficient documentation

## 2023-07-10 NOTE — Progress Notes (Signed)
The patient is an active 88 year old female that I am seeing for the first time.  She has been having worsening left hip and groin pain for the last 2 to 3 months but has been really hurting her for well over a year now.  She does ambulate occasionally using a cane.  She has a throbbing pain that is daily.  At this point her left hip pain is detrimentally affecting her mobility, her quality of life and her actives daily living.  She has to physically pick up her left leg to get in a car.  She has been seen by orthopedics before for her left hip.  She does have bilateral knee replacements.  She has had left shoulder surgery in the past as well.  She is on Eliquis.  She sees her cardiologist on a regular basis and does have an appointment with cardiology in December of this year.  She is interested in hip replacement surgery.  She does live with her husband who is 37 and he is having some memory issues.  I was able to review all of her medications and past medical history within epic.  On exam she does walk with a significant limp and Trendelenburg gait.  Her left leg is shorter than the right.  Her left hip has severe stiffness with internal and external rotation and significant pain in the groin with rotation.  Both her knees move smoothly and her right hip also moves smoothly.  An AP pelvis and lateral of her left hip shows severe end-stage arthritis with bone-on-bone wear of the left hip.  There is complete loss of joint space with cystic changes as well.  At this point I would not recommend a steroid injection in her left hip because there is reallyto put the injection and this would really not help her given the severity of her arthritis.  We talked in length in detail about hip replacement surgery.  I gave her hand about hip replacement surgery and went over the x-rays with her.  I went over hip replacement model.  We talked about the risks and benefits of the surgery and what to expect from an  intraoperative and postoperative standpoint.  She does see her cardiologist in December.  She would like to be scheduled for surgery potentially in January depending on cardiac clearance.  She will need to stop Eliquis 3 days before surgery.  All questions concerns were answered and addressed.

## 2023-07-12 ENCOUNTER — Telehealth: Payer: Self-pay | Admitting: Orthopaedic Surgery

## 2023-07-12 ENCOUNTER — Telehealth: Payer: Self-pay | Admitting: Cardiovascular Disease

## 2023-07-12 NOTE — Telephone Encounter (Signed)
   Pre-operative Risk Assessment    Patient Name: Robin Ortiz  DOB: 06/06/1935 MRN: [id number]      Request for Surgical Clearance    Procedure:  L total hip arthroplasty  Date of Surgery:  Clearance TBD                                 Surgeon:  Doneen Poisson, MD Surgeon's Group or Practice Name:  [facility] Phone number:  [phone] Fax number:  [phone]   Type of Clearance Requested:  Pharmacy, stop Eliquis 3days prior    Type of Anesthesia:  Not Indicated   Additional requests/questions:    Signed, Narda Amber   07/12/2023, 1:11 PM

## 2023-07-12 NOTE — Telephone Encounter (Signed)
Pt called in and she changed her mind about getting surgery later and would like to get it ASAP please advise

## 2023-07-12 NOTE — Telephone Encounter (Signed)
Patient with diagnosis of afib on Eliquis for anticoagulation.    Procedure: L total hip arthroplasty  Date of procedure: TBD   CHA2DS2-VASc Score = 5   This indicates a 7.2% annual risk of stroke. The patient's score is based upon: CHF History: 1 HTN History: 1 Diabetes History: 0 Stroke History: 0 Vascular Disease History: 0 Age Score: 2 Gender Score: 1      CrCl 44 ml/min Platelet count 265  Per office protocol, patient can hold Eliquis for 3 days prior to procedure.    **This guidance is not considered finalized until pre-operative APP has relayed final recommendations.**

## 2023-07-12 NOTE — Telephone Encounter (Signed)
I called patient and left voice mail that I received her message and will send for cardiac clearance.  Will schedule as soon as patient is cleared.

## 2023-07-15 ENCOUNTER — Ambulatory Visit: Payer: Medicare Other | Attending: Cardiovascular Disease | Admitting: Cardiovascular Disease

## 2023-07-15 ENCOUNTER — Encounter: Payer: Self-pay | Admitting: Cardiovascular Disease

## 2023-07-15 VITALS — BP 120/60 | HR 64 | Ht 65.0 in | Wt 143.1 lb

## 2023-07-15 DIAGNOSIS — I428 Other cardiomyopathies: Secondary | ICD-10-CM | POA: Diagnosis not present

## 2023-07-15 DIAGNOSIS — I5022 Chronic systolic (congestive) heart failure: Secondary | ICD-10-CM | POA: Insufficient documentation

## 2023-07-15 DIAGNOSIS — R0602 Shortness of breath: Secondary | ICD-10-CM | POA: Diagnosis present

## 2023-07-15 DIAGNOSIS — E782 Mixed hyperlipidemia: Secondary | ICD-10-CM | POA: Insufficient documentation

## 2023-07-15 DIAGNOSIS — I4819 Other persistent atrial fibrillation: Secondary | ICD-10-CM | POA: Insufficient documentation

## 2023-07-15 DIAGNOSIS — I10 Essential (primary) hypertension: Secondary | ICD-10-CM | POA: Insufficient documentation

## 2023-07-15 MED ORDER — SPIRONOLACTONE 25 MG PO TABS: 25.0000 mg | ORAL_TABLET | Freq: Every day | ORAL | 3 refills | Status: DC

## 2023-07-15 MED ORDER — FUROSEMIDE 40 MG PO TABS: ORAL_TABLET | ORAL | 3 refills | Status: DC

## 2023-07-15 MED ORDER — ENTRESTO 97-103 MG PO TABS: 1.0000 | ORAL_TABLET | Freq: Two times a day (BID) | ORAL | 3 refills | Status: DC

## 2023-07-15 MED ORDER — SIMVASTATIN 20 MG PO TABS: 20.0000 mg | ORAL_TABLET | Freq: Every day | ORAL | 3 refills | Status: DC

## 2023-07-15 NOTE — Telephone Encounter (Signed)
Primary Cardiologist:[name], MD   Preoperative team, please contact this patient and set up a phone call appointment for further preoperative risk assessment. Please obtain consent and complete medication review. Thank you for your help.   I confirm that guidance regarding antiplatelet and oral anticoagulation therapy has been completed and, if necessary, noted below.  I also confirmed the patient resides in the state of [place]. As per [facility] Medical Board telemedicine laws, the patient must reside in the state in which the provider is licensed.   [name], NP-C 07/15/2023, 8:29 AM [address] Office [phone] Fax [phone]

## 2023-07-15 NOTE — Patient Instructions (Signed)
Medication Instructions:  No changes  Ok to stop eliquis 3 days prior to surgery Restart once cleared by orthopedics team  If you need a refill on your cardiac medications before your next appointment, please call your pharmacy.   Lab work: No new labs needed  Testing/Procedures: No new testing needed  Follow-Up: At [facility], you and your health needs are our priority.  As part of our continuing mission to provide you with exceptional heart care, we have created designated Provider Care Teams.  These Care Teams include your primary Cardiologist (physician) and Advanced Practice Providers (APPs -  Physician Assistants and Nurse Practitioners) who all work together to provide you with the care you need, when you need it.  You will need a follow up appointment in 6 months, APP ok  Providers on your designated Care Team:   Nicolasa Ducking, NP Eula Listen, PA-C Cadence Fransico Michael, [place]  COVID-19 Vaccine Information can be found at: [email or web address] For questions related to vaccine distribution or appointments, please email [email or web address] or call [phone].

## 2023-07-15 NOTE — Telephone Encounter (Signed)
I s/w the pt about tele pre op appt. Pt said she is trying to get the surgery done ASAP as she is having pain and trouble sleeping because of the pain. I stated the first tele visit pre op appt I had was not until 07/25/23. I asked if she would like for me to see if I can find an in office appt sooner. Pt said yes please.   Dr. Mariah Milling had an opening at 4:20 today. Pt is grateful and took the appt. I stated I will not cancel the 09/2023 appt with MD as this can be discussed at appt today. I will update all parties involved.

## 2023-07-15 NOTE — Telephone Encounter (Signed)
I will forward this update to pre op APP to review notes from  Dr. Mariah Milling.

## 2023-07-15 NOTE — Progress Notes (Signed)
Cardiology Office Note  Date:  07/15/2023   ID:  Robin Ortiz, DOB 12-31-1934, MRN [id number]  PCP:  Jaclyn Shaggy, MD   Chief Complaint  Patient presents with   PRE-OP clearance for left hip surgery     Patient will need to come off Eliquis 3 days prior to hip surgery per Dr. Doneen Poisson. The patient is not scheduled for hip surgery yet. Medications reviewed by the patient verbally.     HPI:  [name] is a pleasant 22 was havingyear-old woman with a history of  hypertension,  hyperlipidemia,  remote history of nonischemic cardiomyopathy with initial ejection fraction 20-30% in 2002 that has improved to 50-55% on echocardiogram in March 2009,  status post bilateral knee replacement  Anxiety/gets stressed, chronic fatigue  CHA2DS2-VASc of 5. Ejection fraction 35 to 40% in atrial fibrillation Jan 2023 Cardioversion December 14, 2021 for fib flutter, on amiodarone ejection fraction up to 45 to 50% in 04/18/2022 Of beta-blockers in the setting of junctional bradycardia who presents for routine follow up of her cardiomyopathy, atrial fib/flutter  Last seen in clinic by myself June 2024 Scheduled for left total hip arthroplasty with Dr. Magnus Ivan Fall one month ago, shoulder trauma on the right Needs to come off eliquis Doing PT Severe left hip pain, not sleeping well  Doing well from a cardiac perspective, denies chest pain or shortness of breath on exertion Activity limited by left hip pain and other arthritides, uses a cane to get around  On entresto, aldactone,  Lasix 1/2 pill  2x a week, ran out 2 weeks ago Denies leg swelling, no PND orthopnea  EKG personally reviewed by myself on todays visit EKG Interpretation Date/Time:  Monday July 15 2023 16:38:38 EDT Ventricular Rate:  64 PR Interval:  158 QRS Duration:  76 QT Interval:  392 QTC Calculation: 404 R Axis:   13  Text Interpretation: Normal sinus rhythm Possible Anterior infarct , age undetermined  When compared with ECG of 14-Dec-2021 07:37, PREVIOUS ECG IS PRESENT Confirmed by Julien Nordmann [phone]) on 07/15/2023 4:45:03 PM    Hx of junctional bradycardia rate 39 bpm on Carvedilol and amiodarone held  Echocardiogram July 2023 EF 45 to 50% up from 35 to 40% in January 2023  Stress at home, husband with dementia  Echocardiogram April 18, 2022  Ejection fraction up to 45 to 50%, previously 35 to 40%  Prior history of GERD, arthritic pain left leg, neuropathy  Seen by one of our providers October 17, 2021, in atrial fibrillation  Prior echocardiogram history EF previous as low as 20 to 30% in 2002  45 to 50% in December 2016.  Echo 10/18/2021 ef 35 to 40% Moderate mitral valve  regurgitation.  Left atrial size was severely dilated.  Tricuspid valve regurgitation is mild to moderate.  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  PSH:    Past Surgical History:  Procedure Laterality Date   CARDIOVERSION N/A 12/14/2021   Procedure: CARDIOVERSION;  Surgeon: Antonieta Iba, MD;  Location: [facility] ORS;  Service: Cardiovascular;  Laterality: N/A;   ROTATOR CUFF REPAIR     left shoulder   ruptured tendon right foot Right    surgical repair   TOTAL KNEE ARTHROPLASTY Bilateral     Current Outpatient Medications  Medication Sig Dispense Refill   acetaminophen (  TYLENOL) 500 MG tablet Take 500 mg by mouth every 6 (six) hours as needed.     apixaban (ELIQUIS) 5 MG TABS tablet Take 1 tablet (5 mg total) by mouth 2 (two) times daily. 180 tablet 3   Calcium-Vitamin D 600-200 MG-UNIT per tablet Take 1 tablet by mouth daily.     diclofenac Sodium (VOLTAREN) 1 % GEL Apply 2 g topically daily as needed (Arthritis).     diphenhydrAMINE-APAP, sleep,  (TYLENOL PM EXTRA STRENGTH PO) Take 1 tablet by mouth at bedtime as needed (sleep).     Misc Natural Products (OSTEO BI-FLEX ADV JOINT SHIELD PO) Take 1 tablet by mouth daily.     Multiple Vitamin (MULTIVITAMIN) tablet Take 1 tablet by mouth daily. Alive     sacubitril-valsartan (ENTRESTO) 97-103 MG Take 1 tablet by mouth 2 (two) times daily. Please keep upcoming appointment for future refills. Thank you. 180 tablet 3   simvastatin (ZOCOR) 20 MG tablet Take 1 tablet (20 mg total) by mouth at bedtime. 90 tablet 3   spironolactone (ALDACTONE) 25 MG tablet Take 1 tablet (25 mg total) by mouth daily. 90 tablet 3   celecoxib (CELEBREX) 200 MG capsule Take 200 mg by mouth daily as needed for mild pain. (Patient not taking: Reported on 07/15/2023)     furosemide (LASIX) 40 MG tablet Take 0.5 tablet (20 mg) by mouth on Mondays & Fridays. (Patient not taking: Reported on 07/15/2023) 30 tablet 3   raloxifene (EVISTA) 60 MG tablet Take 60 mg by mouth daily. (Patient not taking: Reported on 07/15/2023)     No current facility-administered medications for this visit.    Allergies:   Amiodarone   Social History:  The patient  reports that she has never smoked. She has never used smokeless tobacco. She reports current alcohol use of about 2.0 - 3.0 standard drinks of alcohol per week. She reports that she does not use drugs.   Family History:   family history includes Breast cancer in her cousin; Breast cancer (age of onset: 34) in her maternal aunt; Breast cancer (age of onset: 70) in her maternal grandmother; Heart disease in her father.   Review of Systems: Review of Systems  Constitutional: Negative.   HENT: Negative.    Respiratory: Negative.    Cardiovascular: Negative.   Gastrointestinal: Negative.   Musculoskeletal: Negative.   Neurological: Negative.   All other systems reviewed and are negative.  PHYSICAL EXAM: VS:  BP 120/60 (BP Location: Left Arm, Patient Position: Sitting, Cuff Size:  Normal)   Pulse 64   Ht 5\' 5"  (1.651 m)   Wt 143 lb 2 oz (64.9 kg)   SpO2 93%   BMI 23.82 kg/m  , BMI Body mass index is 23.82 kg/m.  Constitutional:  oriented to person, place, and time. No distress.  HENT:  Head: Grossly normal Eyes:  no discharge. No scleral icterus.  Neck: No JVD, no carotid bruits  Cardiovascular: Regular rate and rhythm, no murmurs appreciated Pulmonary/Chest: Clear to auscultation bilaterally, no wheezes or rails Abdominal: Soft.  no distension.  no tenderness.  Musculoskeletal: Normal range of motion Neurological:  normal muscle tone. Coordination normal. No atrophy Skin: Skin warm and dry Psychiatric: normal affect, pleasant  Recent Labs: No results found for requested labs within last 365 days.    Lipid Panel No results found for: "CHOL", "HDL", "LDLCALC", "TRIG"    Wt Readings from Last 3 Encounters:  07/15/23 143 lb 2 oz (64.9 kg)  07/10/23 144 lb 3.2  oz (65.4 kg)  03/04/23 141 lb 6 oz (64.1 kg)     ASSESSMENT AND PLAN:  Preop cardiovascular evaluation Acceptable risk for total left hip arthroplasty Okay to hold Eliquis 3 days prior to surgery, restart Eliquis once cleared by orthopedics No further cardiac testing needed  Persistent atrial fibrillation Prior successful cardioversion, maintaining normal sinus rhythm beta-blocker and amiodarone held for nausea, junctional bradycardia Maintaining NSR,Off b-blocker, off amiodarone For recurrent atrial fibrillation would likely start with cardioversion, would likely need help from EP Denies tachypalpitations concerning for paroxysmal arrhythmia No medication changes made  Chronic diastolic and systolic CHF continue Lasix 10 mg 2 days a week, appears euvolemic Continue Entresto, spironolactone Will hold off on starting SGLT2 inhibitor given orthopedic surgery pending  Mixed hyperlipidemia Continue statin, simvaststin 20 daily  Essential hypertension Labile pressure at home, 100 to  130 No med changes  Chronic fatigue/anxiety Exacerbated by bradycardia  Anxiety Stable, managed by primary care  Cardiomyopathy Long history of cardiomyopathy dating back to 2002 Continue Entresto 97/103 mg twice daily Off carvedilol secondary to bradycardia On spironolactone 25 daily  Improvement in ejection fraction July 2023 No strong indication for repeat study at this time but could be considered in follow-up    Orders Placed This Encounter  Procedures   EKG 12-Lead     Signed, Dossie Arbour, M.D., Ph.D. 07/15/2023  [facility] [place], [place] [phone]

## 2023-07-25 ENCOUNTER — Other Ambulatory Visit: Payer: Self-pay

## 2023-07-28 NOTE — Progress Notes (Signed)
COVID Vaccine received:  []  No [x]  Yes Date of any COVID positive Test in last 90 days:  PCP - Dewaine Oats, MD  Cardiologist - Julien Nordmann, MD  Cardiac clearance in 07-15-23 Epic note  Chest x-ray - 01-12-2023  2v  Epic EKG - 07-15-2023  Epic  Stress Test -  ECHO - 04-18-2022  Epic Cardiac Cath -  Cardioversion - 12-14-2021 at [facility]  PCR screen: [x]  Ordered & Completed []   No Order but Needs PROFEND     []   N/A for this surgery  Surgery Plan:  []  Ambulatory   [x]  Outpatient in bed  []  Admit Anesthesia:    []  General  [x]  Spinal  []   Choice []   MAC  Pacemaker / ICD device [x]  No []  Yes   Spinal Cord Stimulator:[x]  No []  Yes       History of Sleep Apnea? [x]  No []  Yes   CPAP used?- [x]  No []  Yes    Does the patient monitor blood sugar?   []  N/A   []  No []  Yes  Patient has: []  NO Hx DM   []  Pre-DM   []  DM1  []   DM2 Last A1c was:        on       Blood Thinner / Instructions: ELIQUIS   hold x 3 days per Dr. Mariah Milling Aspirin Instructions:  none  ERAS Protocol Ordered: []  No  [x]  Yes PRE-SURGERY [x]  ENSURE  []  G2    Patient is to be NPO after: 05:30  Dental hx: []  Dentures:  []  N/A      []  Bridge or Partial:                   []  Loose or Damaged teeth:   Comments: Patient was given the 5 CHG shower / bath instructions for THA surgery along with 2 bottles of the CHG soap. Patient will start this on: Monday 08-05-2023 All questions were asked and answered, Patient voiced understanding of this process.   Activity level: Patient is able / unable to climb a flight of stairs without difficulty; []  No CP  []  No SOB, but would have ___   Patient can / can not perform ADLs without assistance.   Anesthesia review: A.fib- cardioversion 12-14-21, NICM (HFmrEF), HTN, anxiety  Patient denies shortness of breath, fever, cough and chest pain at PAT appointment.  Patient verbalized understanding and agreement to the Pre-Surgical Instructions that were given to them at this PAT appointment. Patient  was also educated of the need to review these PAT instructions again prior to her surgery.I reviewed the appropriate phone numbers to call if they have any and questions or concerns.

## 2023-07-28 NOTE — Patient Instructions (Signed)
SURGICAL WAITING ROOM VISITATION Patients having surgery or a procedure may have no more than 2 support people in the waiting area - these visitors may rotate in the visitor waiting room.   Due to an increase in RSV and influenza rates and associated hospitalizations, children ages 58 and under may not visit patients in [facility] hospitals. If the patient needs to stay at the hospital during part of their recovery, the visitor guidelines for inpatient rooms apply.  PRE-OP VISITATION  Pre-op nurse will coordinate an appropriate time for 1 support person to accompany the patient in pre-op.  This support person may not rotate.  This visitor will be contacted when the time is appropriate for the visitor to come back in the pre-op area.  Please refer to the [facility] website for the visitor guidelines for Inpatients (after your surgery is over and you are in a regular room).  You are not required to quarantine at this time prior to your surgery. However, you must do this: Hand Hygiene often Do NOT share personal items Notify your provider if you are in close contact with someone who has COVID or you develop fever 100.4 or greater, new onset of sneezing, cough, sore throat, shortness of breath or body aches.  If you test positive for Covid or have been in contact with anyone that has tested positive in the last 10 days please notify you surgeon.    Your procedure is scheduled on:  Friday  August 09, 2023  Report to [facility] Main Entrance: [place] entrance where the [employer] is available.   Report to admitting at: 06:00    AM  Call this number if you have any questions or problems the morning of surgery [phone]  Do not eat food after Midnight the night prior to your surgery/procedure.  After Midnight you may have the following liquids until  05:30  AM DAY OF SURGERY  Clear Liquid Diet Water Black Coffee (sugar ok, NO MILK/CREAM OR CREAMERS)  Tea (sugar ok, NO  MILK/CREAM OR CREAMERS) regular and decaf                             Plain Jell-O  with no fruit (NO RED)                                           Fruit ices (not with fruit pulp, NO RED)                                     Popsicles (NO RED)                                                                  Juice: NO CITRUS JUICES: only apple, WHITE grape, WHITE cranberry Sports drinks like Gatorade or Powerade (NO RED)                   The day of surgery:  Drink ONE (1) Pre-Surgery Clear Ensure  at   05:30  AM the morning of surgery. Drink in one sitting. Do not sip.  This drink was given to you during your hospital pre-op appointment visit. Nothing else to drink after completing the Pre-Surgery Clear Ensure : No candy, chewing gum or throat lozenges.    FOLLOW ANY ADDITIONAL PRE OP INSTRUCTIONS YOU RECEIVED FROM YOUR SURGEON'S OFFICE!!!   Oral Hygiene is also important to reduce your risk of infection.        Remember - BRUSH YOUR TEETH THE MORNING OF SURGERY WITH YOUR REGULAR TOOTHPASTE  Do NOT smoke after Midnight the night before surgery.  ELIQUIS- Stop taking this 3 days before your surgery.  Last dose will be taken on Monday 08-05-2023  STOP TAKING all Vitamins, Herbs and supplements 1 week before your surgery.   Take ONLY these medicines the morning of surgery with A SIP OF WATER:  Tylenol,                        You may not have any metal on your body including  jewelry, and body piercing  Do not wear  lotions, powders,  cologne, or deodorant  Men may shave face and neck.  Contacts, Hearing Aids, dentures or bridgework may not be worn into surgery. DENTURES WILL BE REMOVED PRIOR TO SURGERY PLEASE DO NOT APPLY "Poly grip" OR ADHESIVES!!!  You may bring a small overnight bag with you on the day of surgery, only pack items that are not valuable. [facility] IS NOT RESPONSIBLE   FOR VALUABLES THAT ARE LOST OR STOLEN.   Do not bring your home medications to the  hospital. The Pharmacy will dispense medications listed on your medication list to you during your admission in the Hospital.  Special Instructions: Bring a copy of your healthcare power of attorney and living will documents the day of surgery, if you wish to have them scanned into your [facility] Records- EPIC  Please read over the following fact sheets you were given: IF YOU HAVE QUESTIONS ABOUT YOUR PRE-OP INSTRUCTIONS, PLEASE CALL [phone].     Pre-operative 5 CHG Bath Instructions   You can play a key role in reducing the risk of infection after surgery. Your skin needs to be as free of germs as possible. You can reduce the number of germs on your skin by washing with CHG (chlorhexidine gluconate) soap before surgery. CHG is an antiseptic soap that kills germs and continues to kill germs even after washing.   DO NOT use if you have an allergy to chlorhexidine/CHG or antibacterial soaps. If your skin becomes reddened or irritated, stop using the CHG and notify one of our RNs at [phone]  Please shower with the CHG soap starting 4 days before surgery using the following schedule: START SHOWERS ON    MONDAY  August 05, 2023  Please keep in mind the following:  DO NOT shave, including legs and underarms, starting the day of your first shower.   You may shave your face at any point before/day of surgery.   Place clean sheets on your bed the day you start using CHG soap. Use a clean washcloth (not used since being washed) for each shower. DO NOT sleep with pets once you start using the CHG.   CHG Shower Instructions:  If you choose to wash your hair and private area, wash first with your normal shampoo/soap.  After you use shampoo/soap, rinse your hair and body thoroughly to remove  shampoo/soap residue.  Turn the water OFF and apply about 3 tablespoons (45 ml) of CHG soap to a CLEAN washcloth.  Apply CHG soap ONLY FROM YOUR NECK DOWN TO YOUR TOES (washing for 3-5 minutes)  DO NOT use CHG soap on face, private areas, open wounds, or sores.  Pay special attention to the area where your surgery is being performed.  If you are having back surgery, having someone wash your back for you may be helpful.  Wait 2 minutes after CHG soap is applied, then you may rinse off the CHG soap.  Pat dry with a clean towel  Put on clean clothes/pajamas   If you choose to wear lotion, please use ONLY the CHG-compatible lotions on the back of this paper.     Additional instructions for the day of surgery: DO NOT APPLY any lotions, deodorants, cologne, or perfumes.   Put on clean/comfortable clothes.  Brush your teeth.  Ask your nurse before applying any prescription medications to the skin.      CHG Compatible Lotions   Aveeno Moisturizing lotion  Cetaphil Moisturizing Cream  Cetaphil Moisturizing Lotion  Clairol Herbal Essence Moisturizing Lotion, Dry Skin  Clairol Herbal Essence Moisturizing Lotion, Extra Dry Skin  Clairol Herbal Essence Moisturizing Lotion, Normal Skin  Curel Age Defying Therapeutic Moisturizing Lotion with Alpha Hydroxy  Curel Extreme Care Body Lotion  Curel Soothing Hands Moisturizing Hand Lotion  Curel Therapeutic Moisturizing Cream, Fragrance-Free  Curel Therapeutic Moisturizing Lotion, Fragrance-Free  Curel Therapeutic Moisturizing Lotion, Original Formula  Eucerin Daily Replenishing Lotion  Eucerin Dry Skin Therapy Plus Alpha Hydroxy Crme  Eucerin Dry Skin Therapy Plus Alpha Hydroxy Lotion  Eucerin Original Crme  Eucerin Original Lotion  Eucerin Plus Crme Eucerin Plus Lotion  Eucerin TriLipid Replenishing Lotion  Keri Anti-Bacterial Hand Lotion  Keri Deep Conditioning Original Lotion Dry Skin Formula Softly Scented  Keri Deep Conditioning  Original Lotion, Fragrance Free Sensitive Skin Formula  Keri Lotion Fast Absorbing Fragrance Free Sensitive Skin Formula  Keri Lotion Fast Absorbing Softly Scented Dry Skin Formula  Keri Original Lotion  Keri Skin Renewal Lotion Keri Silky Smooth Lotion  Keri Silky Smooth Sensitive Skin Lotion  Nivea Body Creamy Conditioning Oil  Nivea Body Extra Enriched Lotion  Nivea Body Original Lotion  Nivea Body Sheer Moisturizing Lotion Nivea Crme  Nivea Skin Firming Lotion  NutraDerm 30 Skin Lotion  NutraDerm Skin Lotion  NutraDerm Therapeutic Skin Cream  NutraDerm Therapeutic Skin Lotion  ProShield Protective Hand Cream  Provon moisturizing lotion   FAILURE TO FOLLOW THESE INSTRUCTIONS MAY RESULT IN THE CANCELLATION OF YOUR SURGERY  PATIENT SIGNATURE_________________________________  NURSE SIGNATURE__________________________________  ________________________________________________________________________    Robin Ortiz    An incentive spirometer is a tool that can help keep your lungs clear and active. This tool measures how well you are filling your lungs with each breath. Taking long deep breaths  may help reverse or decrease the chance of developing breathing (pulmonary) problems (especially infection) following: A long period of time when you are unable to move or be active. BEFORE THE PROCEDURE  If the spirometer includes an indicator to show your best effort, your nurse or respiratory therapist will set it to a desired goal. If possible, sit up straight or lean slightly forward. Try not to slouch. Hold the incentive spirometer in an upright position. INSTRUCTIONS FOR USE  Sit on the edge of your bed if possible, or sit up as far as you can in bed or on a chair. Hold the incentive spirometer in an upright position. Breathe out normally. Place the mouthpiece in your mouth and seal your lips tightly around it. Breathe in slowly and as deeply as possible, raising the  piston or the ball toward the top of the column. Hold your breath for 3-5 seconds or for as long as possible. Allow the piston or ball to fall to the bottom of the column. Remove the mouthpiece from your mouth and breathe out normally. Rest for a few seconds and repeat Steps 1 through 7 at least 10 times every 1-2 hours when you are awake. Take your time and take a few normal breaths between deep breaths. The spirometer may include an indicator to show your best effort. Use the indicator as a goal to work toward during each repetition. After each set of 10 deep breaths, practice coughing to be sure your lungs are clear. If you have an incision (the cut made at the time of surgery), support your incision when coughing by placing a pillow or rolled up towels firmly against it. Once you are able to get out of bed, walk around indoors and cough well. You may stop using the incentive spirometer when instructed by your caregiver.  RISKS AND COMPLICATIONS Take your time so you do not get dizzy or light-headed. If you are in pain, you may need to take or ask for pain medication before doing incentive spirometry. It is harder to take a deep breath if you are having pain. AFTER USE Rest and breathe slowly and easily. It can be helpful to keep track of a log of your progress. Your caregiver can provide you with a simple table to help with this. If you are using the spirometer at home, follow these instructions: SEEK MEDICAL CARE IF:  You are having difficultly using the spirometer. You have trouble using the spirometer as often as instructed. Your pain medication is not giving enough relief while using the spirometer. You develop fever of 100.5 F (38.1 C) or higher.                                                                                                    SEEK IMMEDIATE MEDICAL CARE IF:  You cough up bloody sputum that had not been present before. You develop fever of 102 F (38.9 C) or  greater. You develop worsening pain at or near the incision site. MAKE SURE YOU:  Understand these instructions. Will watch your condition. Will get help  right away if you are not doing well or get worse. Document Released: 01/28/2007 Document Revised: 12/10/2011 Document Reviewed: 03/31/2007 [facility] Patient Information 2014 [place], [place].      WHAT IS A BLOOD TRANSFUSION? Blood Transfusion Information  A transfusion is the replacement of blood or some of its parts. Blood is made up of multiple cells which provide different functions. Red blood cells carry oxygen and are used for blood loss replacement. White blood cells fight against infection. Platelets control bleeding. Plasma helps clot blood. Other blood products are available for specialized needs, such as hemophilia or other clotting disorders. BEFORE THE TRANSFUSION  Who gives blood for transfusions?  Healthy volunteers who are fully evaluated to make sure their blood is safe. This is blood bank blood. Transfusion therapy is the safest it has ever been in the practice of medicine. Before blood is taken from a donor, a complete history is taken to make sure that person has no history of diseases nor engages in risky social behavior (examples are intravenous drug use or sexual activity with multiple partners). The donor's travel history is screened to minimize risk of transmitting infections, such as malaria. The donated blood is tested for signs of infectious diseases, such as HIV and hepatitis. The blood is then tested to be sure it is compatible with you in order to minimize the chance of a transfusion reaction. If you or a relative donates blood, this is often done in anticipation of surgery and is not appropriate for emergency situations. It takes many days to process the donated blood. RISKS AND COMPLICATIONS Although transfusion therapy is very safe and saves many lives, the main dangers of transfusion include:  Getting an  infectious disease. Developing a transfusion reaction. This is an allergic reaction to something in the blood you were given. Every precaution is taken to prevent this. The decision to have a blood transfusion has been considered carefully by your caregiver before blood is given. Blood is not given unless the benefits outweigh the risks. AFTER THE TRANSFUSION Right after receiving a blood transfusion, you will usually feel much better and more energetic. This is especially true if your red blood cells have gotten low (anemic). The transfusion raises the level of the red blood cells which carry oxygen, and this usually causes an energy increase. The nurse administering the transfusion will monitor you carefully for complications. HOME CARE INSTRUCTIONS  No special instructions are needed after a transfusion. You may find your energy is better. Speak with your caregiver about any limitations on activity for underlying diseases you may have. SEEK MEDICAL CARE IF:  Your condition is not improving after your transfusion. You develop redness or irritation at the intravenous (IV) site. SEEK IMMEDIATE MEDICAL CARE IF:  Any of the following symptoms occur over the next 12 hours: Shaking chills. You have a temperature by mouth above 102 F (38.9 C), not controlled by medicine. Chest, back, or muscle pain. People around you feel you are not acting correctly or are confused. Shortness of breath or difficulty breathing. Dizziness and fainting. You get a rash or develop hives. You have a decrease in urine output. Your urine turns a dark color or changes to pink, red, or brown. Any of the following symptoms occur over the next 10 days: You have a temperature by mouth above 102 F (38.9 C), not controlled by medicine. Shortness of breath. Weakness after normal activity. The white part of the eye turns yellow (jaundice). You have a decrease in the amount  of urine or are urinating less often. Your urine  turns a dark color or changes to pink, red, or brown. Document Released: 09/14/2000 Document Revised: 12/10/2011 Document Reviewed: 05/03/2008 [facility] Patient Information 2014 [place], [place].  _______________________________________________________________________

## 2023-07-30 ENCOUNTER — Encounter (HOSPITAL_COMMUNITY)
Admission: RE | Admit: 2023-07-30 | Discharge: 2023-07-30 | Disposition: A | Discharge status: Home / Self Care | Payer: Medicare Other | Source: Ambulatory Visit | Attending: Orthopaedic Surgery | Admitting: Orthopaedic Surgery

## 2023-07-30 ENCOUNTER — Encounter (HOSPITAL_COMMUNITY): Payer: Self-pay

## 2023-07-30 ENCOUNTER — Other Ambulatory Visit: Payer: Self-pay

## 2023-07-30 VITALS — BP 120/66 | HR 99 | Temp 98.5°F | Resp 16 | Ht 65.0 in | Wt 140.0 lb

## 2023-07-30 DIAGNOSIS — I2510 Atherosclerotic heart disease of native coronary artery without angina pectoris: Secondary | ICD-10-CM | POA: Insufficient documentation

## 2023-07-30 DIAGNOSIS — Z01812 Encounter for preprocedural laboratory examination: Secondary | ICD-10-CM | POA: Insufficient documentation

## 2023-07-30 DIAGNOSIS — M1612 Unilateral primary osteoarthritis, left hip: Secondary | ICD-10-CM | POA: Insufficient documentation

## 2023-07-30 DIAGNOSIS — Z01818 Encounter for other preprocedural examination: Secondary | ICD-10-CM

## 2023-07-30 HISTORY — DX: Myoneural disorder, unspecified: G70.9

## 2023-07-30 HISTORY — DX: Unspecified osteoarthritis, unspecified site: M19.90

## 2023-07-30 HISTORY — DX: Pneumonia, unspecified organism: J18.9

## 2023-07-30 HISTORY — DX: Family history of other specified conditions: Z84.89

## 2023-07-30 LAB — COMPREHENSIVE METABOLIC PANEL
ALT: 15 U/L (ref 0–44)
AST: 17 U/L (ref 15–41)
Albumin: 4.3 g/dL (ref 3.5–5.0)
Alkaline Phosphatase: 57 U/L (ref 38–126)
Anion gap: 8 (ref 5–15)
BUN: 22 mg/dL (ref 8–23)
CO2: 23 mmol/L (ref 22–32)
Calcium: 10.5 mg/dL — ABNORMAL HIGH (ref 8.9–10.3)
Chloride: 105 mmol/L (ref 98–111)
Creatinine, Ser: 0.8 mg/dL (ref 0.44–1.00)
GFR, Estimated: >60 mL/min (ref >60)
Glucose, Bld: 112 mg/dL — ABNORMAL HIGH (ref 70–99)
Potassium: 4.8 mmol/L (ref 3.5–5.1)
Sodium: 136 mmol/L (ref 135–145)
Total Bilirubin: 0.9 mg/dL (ref 0.3–1.2)
Total Protein: 7.7 g/dL (ref 6.5–8.1)

## 2023-07-30 LAB — CBC
HCT: 40.7 % (ref 36.0–46.0)
Hemoglobin: 12.6 g/dL (ref 12.0–15.0)
MCH: 27.3 pg (ref 26.0–34.0)
MCHC: 31 g/dL (ref 30.0–36.0)
MCV: 88.3 fL (ref 80.0–100.0)
Platelets: 277 10*3/uL (ref 150–400)
RBC: 4.61 MIL/uL (ref 3.87–5.11)
RDW: 14.6 % (ref 11.5–15.5)
WBC: 8 10*3/uL (ref 4.0–10.5)
nRBC: 0 % (ref 0.0–0.2)

## 2023-07-30 LAB — SURGICAL PCR SCREEN
MRSA, PCR: NEGATIVE
Staphylococcus aureus: NEGATIVE

## 2023-07-31 NOTE — Progress Notes (Signed)
Type and Screen is positive for antibodies. This note was routed to Dr. Magnus Ivan and Richardean Canal, PA. Patient's has Left THA anterior scheduled for 08-09-2023 at [facility].

## 2023-08-02 NOTE — Progress Notes (Signed)
Anesthesia Chart Review   Case: [id number] Date/Time: 08/09/23 0815   Procedure: LEFT TOTAL HIP ARTHROPLASTY ANTERIOR APPROACH (Left: Hip)   Anesthesia type: Spinal   Pre-op diagnosis: OSTEOARTHRITIS LEFT HIP   Location: [facility] ROOM 10 / [facility]   Surgeons: Kathryne Hitch, MD       DISCUSSION:88 y.o. never smoker with h/o HTN, NICM, atrial fibrillation on Eliquis, left hip OA scheduled for above procedure 08/09/2023 with Dr. Doneen Poisson.   Pt seen by cardiology 07/15/2023 for preoperative evaluation.  Per OV note, "Preop cardiovascular evaluation Acceptable risk for total left hip arthroplasty Okay to hold Eliquis 3 days prior to surgery, restart Eliquis once cleared by orthopedics No further cardiac testing needed"  Pt reports last dose of Eliquis 08/05/2023.   VS: BP 120/66 Comment: right arm sitting  Pulse 99   Temp 36.9 C (Oral)   Resp 16   Ht 5\' 5"  (1.651 m)   Wt 63.5 kg   SpO2 100%   BMI 23.30 kg/m   PROVIDERS: Jaclyn Shaggy, MD is PCP   Cardiologist - Julien Nordmann, MD   LABS: Labs reviewed: Acceptable for surgery. (all labs ordered are listed, but only abnormal results are displayed)  Labs Reviewed  COMPREHENSIVE METABOLIC PANEL - Abnormal; Notable for the following components:      Result Value   Glucose, Bld 112 (*)    Calcium 10.5 (*)    All other components within normal limits  SURGICAL PCR SCREEN  CBC  TYPE AND SCREEN     IMAGES:   EKG:   CV: Echo 04/18/2022  1. Left ventricular ejection fraction, by estimation, is 45 to 50%. Left  ventricular ejection fraction by 2D MOD biplane is 46.0 %. The left  ventricle has mildly decreased function. The left ventricle has no  regional wall motion abnormalities. Left  ventricular diastolic parameters are consistent with Grade II diastolic  dysfunction (pseudonormalization). The average left ventricular global  longitudinal strain is -16.5 %.   2. Right ventricular systolic function is  low normal. The right  ventricular size is normal.   3. Left atrial size was severely dilated.   4. The mitral valve is normal in structure. Mild to moderate mitral valve  regurgitation.   5. The aortic valve is tricuspid. Aortic valve regurgitation is not  visualized.   6. The inferior vena cava is normal in size with <50% respiratory  variability, suggesting right atrial pressure of 8 mmHg.  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 2002 Echo: EF 20-30%; b. 09/2015 Echo: EF 45-50%, diff HK. Mild MR. Mod dil LA; c. 10/2021 Echo: EF 35-40% (in setting of afib), no rwma, low-nl RV fxn, sev dil LA, mildly dil RA, mod MR, mild-mod TR.   Essential hypertension    Family history of adverse reaction to anesthesia    son has PONV   Mixed hyperlipidemia    Neuromuscular disorder (HCC)    carpal tunnel   NICM (nonischemic cardiomyopathy) (HCC)    Osteoporosis    Persistent atrial fibrillation (HCC)    a. Dx 10/2021. CHA2DS2VASc = 5-->Eliquis; b. 11/2021 s/p DCCV (120J).   Pneumonia     Past Surgical History:  Procedure Laterality Date   APPENDECTOMY  age 50   CARDIOVERSION N/A 12/14/2021   Procedure: CARDIOVERSION;  Surgeon: Antonieta Iba, MD;  Location: [facility] ORS;  Service: Cardiovascular;  Laterality: N/A;   ROTATOR CUFF REPAIR     left shoulder   ruptured tendon right foot Right    surgical repair   TOTAL KNEE ARTHROPLASTY Bilateral     MEDICATIONS:  acetaminophen (TYLENOL) 650 MG CR tablet   apixaban (ELIQUIS) 5 MG TABS tablet   Calcium-Vitamin D 600-200 MG-UNIT per tablet   diclofenac Sodium (VOLTAREN) 1 % GEL   diphenhydrAMINE-APAP, sleep, (TYLENOL PM EXTRA STRENGTH PO)   furosemide (LASIX) 40 MG tablet   Misc Natural Products (OSTEO BI-FLEX ADV JOINT SHIELD PO)   Multiple Vitamin  (MULTIVITAMIN) tablet   sacubitril-valsartan (ENTRESTO) 97-103 MG   simvastatin (ZOCOR) 20 MG tablet   spironolactone (ALDACTONE) 25 MG tablet   No current facility-administered medications for this encounter.    Jodell Cipro Ward, PA-C [facility] Pre-Surgical Testing [phone]

## 2023-08-08 NOTE — H&P (Signed)
TOTAL HIP ADMISSION H&P  Patient is admitted for left total hip arthroplasty.  Subjective:  Chief Complaint: left hip pain  HPI: Robin Ortiz, 88 y.o. female, has a history of pain and functional disability in the left hip(s) due to arthritis and patient has failed non-surgical conservative treatments for greater than 12 weeks to include NSAID's and/or analgesics, use of assistive devices, and activity modification.  Onset of symptoms was gradual starting 1 years ago with rapidlly worsening course since that time.The patient noted no past surgery on the left hip(s).  Patient currently rates pain in the left hip at 10 out of 10 with activity. Patient has night pain, worsening of pain with activity and weight bearing, pain that interfers with activities of daily living, and pain with passive range of motion. Patient has evidence of subchondral cysts, subchondral sclerosis, periarticular osteophytes, and joint space narrowing by imaging studies. This condition presents safety issues increasing the risk of falls.  There is no current active infection.  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 2002 Echo: EF 20-30%; b. 09/2015 Echo: EF 45-50%, diff HK. Mild MR. Mod dil LA; c. 10/2021 Echo: EF 35-40% (in setting of afib), no rwma, low-nl RV fxn, sev dil LA, mildly dil RA, mod MR, mild-mod TR.   Essential hypertension    Family history of adverse reaction to anesthesia    son has PONV   Mixed  hyperlipidemia    Neuromuscular disorder (HCC)    carpal tunnel   NICM (nonischemic cardiomyopathy) (HCC)    Osteoporosis    Persistent atrial fibrillation (HCC)    a. Dx 10/2021. CHA2DS2VASc = 5-->Eliquis; b. 11/2021 s/p DCCV (120J).   Pneumonia     Past Surgical History:  Procedure Laterality Date   APPENDECTOMY     age 40   CARDIOVERSION N/A 12/14/2021   Procedure: CARDIOVERSION;  Surgeon: Antonieta Iba, MD;  Location: [facility] ORS;  Service: Cardiovascular;  Laterality: N/A;   ROTATOR CUFF REPAIR     left shoulder   ruptured tendon right foot Right    surgical repair   TOTAL KNEE ARTHROPLASTY Bilateral     No current facility-administered medications for this encounter.   Current Outpatient Medications  Medication Sig Dispense Refill Last Dose   acetaminophen (TYLENOL) 650 MG CR tablet Take 1,300 mg by mouth 2 (two) times daily.      apixaban (ELIQUIS) 5 MG TABS tablet Take 1 tablet (5 mg total) by mouth 2 (two) times daily. 180 tablet 3    Calcium-Vitamin D 600-200 MG-UNIT per tablet Take 1 tablet by mouth daily.      diclofenac Sodium (VOLTAREN) 1 % GEL Apply 2 g topically daily as needed (Arthritis).      diphenhydrAMINE-APAP, sleep, (TYLENOL PM EXTRA STRENGTH PO) Take 1 tablet by mouth at bedtime as needed (sleep).      furosemide (LASIX) 40 MG tablet Take 0.5 tablet (20 mg) by mouth on Mondays & Fridays. 30  tablet 3    Misc Natural Products (OSTEO BI-FLEX ADV JOINT SHIELD PO) Take 1 tablet by mouth daily.      Multiple Vitamin (MULTIVITAMIN) tablet Take 1 tablet by mouth daily. Alive      sacubitril-valsartan (ENTRESTO) 97-103 MG Take 1 tablet by mouth 2 (two) times daily. 180 tablet 3    simvastatin (ZOCOR) 20 MG tablet Take 1 tablet (20 mg total) by mouth at bedtime. 90 tablet 3    spironolactone (ALDACTONE) 25 MG tablet Take 1 tablet (25 mg total) by mouth daily. 90 tablet 3    Allergies  Allergen Reactions   Amiodarone Nausea And Vomiting    Nausea, junctional  bradycardia    Social History   Tobacco Use   Smoking status: Never   Smokeless tobacco: Never  Substance Use Topics   Alcohol use: Yes    Alcohol/week: 2.0 - 3.0 standard drinks of alcohol    Types: 2 - 3 Glasses of wine per week    Comment: occasional glass of wine    Family History  Problem Relation Age of Onset   Heart disease Father    Breast cancer Maternal Aunt 73   Breast cancer Maternal Grandmother 1   Breast cancer Cousin      Review of Systems  Objective:  Physical Exam Vitals reviewed.  Constitutional:      Appearance: Normal appearance. She is normal weight.  HENT:     Head: Normocephalic.  Eyes:     Extraocular Movements: Extraocular movements intact.     Pupils: Pupils are equal, round, and reactive to light.  Cardiovascular:     Rate and Rhythm: Normal rate. Rhythm irregular.  Pulmonary:     Effort: Pulmonary effort is normal.     Breath sounds: Normal breath sounds.  Abdominal:     Palpations: Abdomen is soft.  Musculoskeletal:     Cervical back: Normal range of motion and neck supple.     Left hip: Tenderness and bony tenderness present. Decreased range of motion. Decreased strength.  Neurological:     Mental Status: She is alert and oriented to person, place, and time.  Psychiatric:        Behavior: Behavior normal.     Vital signs in last 24 hours:    Labs:   Estimated body mass index is 23.3 kg/m as calculated from the following:   Height as of 07/30/23: 5\' 5"  (1.651 m).   Weight as of 07/30/23: 63.5 kg.   Imaging Review Plain radiographs demonstrate severe degenerative joint disease of the left hip(s). The bone quality appears to be good for age and reported activity level.      Assessment/Plan:  End stage arthritis, left hip(s)  The patient history, physical examination, clinical judgement of the provider and imaging studies are consistent with end stage degenerative joint disease of the left hip(s) and total hip  arthroplasty is deemed medically necessary. The treatment options including medical management, injection therapy, arthroscopy and arthroplasty were discussed at length. The risks and benefits of total hip arthroplasty were presented and reviewed. The risks due to aseptic loosening, infection, stiffness, dislocation/subluxation,  thromboembolic complications and other imponderables were discussed.  The patient acknowledged the explanation, agreed to proceed with the plan and consent was signed. Patient is being admitted for inpatient treatment for surgery, pain control, PT, OT, prophylactic antibiotics, VTE prophylaxis, progressive ambulation and ADL's and discharge planning.The patient is planning to be discharged home with home health services vs short-term SNF.

## 2023-08-09 ENCOUNTER — Other Ambulatory Visit: Payer: Self-pay

## 2023-08-09 ENCOUNTER — Encounter (HOSPITAL_COMMUNITY): Payer: Self-pay | Admitting: Orthopaedic Surgery

## 2023-08-09 ENCOUNTER — Ambulatory Visit (HOSPITAL_COMMUNITY): Payer: Medicare Other | Admitting: Anesthesiology

## 2023-08-09 ENCOUNTER — Encounter (HOSPITAL_COMMUNITY)
Admission: RE | Disposition: A | Discharge status: Home / Self Care | Payer: Self-pay | Source: Home / Self Care | Attending: Orthopaedic Surgery

## 2023-08-09 ENCOUNTER — Ambulatory Visit (HOSPITAL_COMMUNITY): Payer: Medicare Other | Admitting: Physician Assistant

## 2023-08-09 ENCOUNTER — Observation Stay (HOSPITAL_COMMUNITY): Payer: Medicare Other

## 2023-08-09 ENCOUNTER — Observation Stay (HOSPITAL_COMMUNITY)
Admission: RE | Admit: 2023-08-09 | Discharge: 2023-08-11 | Disposition: A | Discharge status: Home / Self Care | Payer: Medicare Other | Attending: Orthopaedic Surgery | Admitting: Orthopaedic Surgery

## 2023-08-09 ENCOUNTER — Ambulatory Visit (HOSPITAL_COMMUNITY): Payer: Medicare Other

## 2023-08-09 DIAGNOSIS — I4819 Other persistent atrial fibrillation: Secondary | ICD-10-CM | POA: Diagnosis not present

## 2023-08-09 DIAGNOSIS — Z7901 Long term (current) use of anticoagulants: Secondary | ICD-10-CM | POA: Insufficient documentation

## 2023-08-09 DIAGNOSIS — Z79899 Other long term (current) drug therapy: Secondary | ICD-10-CM | POA: Insufficient documentation

## 2023-08-09 DIAGNOSIS — I110 Hypertensive heart disease with heart failure: Secondary | ICD-10-CM | POA: Insufficient documentation

## 2023-08-09 DIAGNOSIS — Z96653 Presence of artificial knee joint, bilateral: Secondary | ICD-10-CM | POA: Diagnosis not present

## 2023-08-09 DIAGNOSIS — Z96642 Presence of left artificial hip joint: Secondary | ICD-10-CM

## 2023-08-09 DIAGNOSIS — I5022 Chronic systolic (congestive) heart failure: Secondary | ICD-10-CM | POA: Diagnosis not present

## 2023-08-09 DIAGNOSIS — M1612 Unilateral primary osteoarthritis, left hip: Principal | ICD-10-CM | POA: Insufficient documentation

## 2023-08-09 HISTORY — PX: TOTAL HIP ARTHROPLASTY: SHX124

## 2023-08-09 SURGERY — ARTHROPLASTY, HIP, TOTAL, ANTERIOR APPROACH
Anesthesia: Spinal | Site: Hip | Laterality: Left

## 2023-08-09 MED ORDER — SODIUM CHLORIDE 0.9 % IV SOLN: INTRAVENOUS | Status: DC

## 2023-08-09 MED ORDER — DEXAMETHASONE SODIUM PHOSPHATE 10 MG/ML IJ SOLN
INTRAMUSCULAR | Status: DC | PRN
  Administered 2023-08-09: 8 mg via INTRAVENOUS

## 2023-08-09 MED ORDER — CEFAZOLIN SODIUM-DEXTROSE 1-4 GM/50ML-% IV SOLN
1.0000 g | Freq: Four times a day (QID) | INTRAVENOUS | Status: AC
  Administered 2023-08-09 (×2): 1 g via INTRAVENOUS
  Filled 2023-08-09 (×2): qty 50

## 2023-08-09 MED ORDER — PHENYLEPHRINE HCL-NACL 20-0.9 MG/250ML-% IV SOLN
INTRAVENOUS | Status: AC
  Filled 2023-08-09: qty 250

## 2023-08-09 MED ORDER — STERILE WATER FOR IRRIGATION IR SOLN
Status: DC | PRN
  Administered 2023-08-09: 1000 mL

## 2023-08-09 MED ORDER — OYSTER SHELL CALCIUM/D3 500-5 MG-MCG PO TABS
1.0000 | ORAL_TABLET | Freq: Every day | ORAL | Status: DC
  Administered 2023-08-10 – 2023-08-11 (×2): 1 via ORAL
  Filled 2023-08-09 (×2): qty 1

## 2023-08-09 MED ORDER — ONDANSETRON HCL 4 MG PO TABS: 4.0000 mg | ORAL_TABLET | Freq: Four times a day (QID) | ORAL | Status: DC | PRN

## 2023-08-09 MED ORDER — TRANEXAMIC ACID-NACL 1000-0.7 MG/100ML-% IV SOLN
1000.0000 mg | INTRAVENOUS | Status: AC
  Administered 2023-08-09: 1000 mg via INTRAVENOUS
  Filled 2023-08-09: qty 100

## 2023-08-09 MED ORDER — BUPIVACAINE IN DEXTROSE 0.75-8.25 % IT SOLN
INTRATHECAL | Status: DC | PRN
  Administered 2023-08-09: 1.5 mL via INTRATHECAL

## 2023-08-09 MED ORDER — FENTANYL CITRATE PF 50 MCG/ML IJ SOSY
25.0000 ug | PREFILLED_SYRINGE | INTRAMUSCULAR | Status: DC | PRN
  Administered 2023-08-09: 50 ug via INTRAVENOUS

## 2023-08-09 MED ORDER — 0.9 % SODIUM CHLORIDE (POUR BTL) OPTIME
TOPICAL | Status: DC | PRN
  Administered 2023-08-09: 1000 mL

## 2023-08-09 MED ORDER — POVIDONE-IODINE 10 % EX SWAB: 2.0000 | Freq: Once | CUTANEOUS | Status: DC

## 2023-08-09 MED ORDER — SPIRONOLACTONE 25 MG PO TABS
25.0000 mg | ORAL_TABLET | Freq: Every day | ORAL | Status: DC
  Administered 2023-08-11: 25 mg via ORAL
  Filled 2023-08-09: qty 1

## 2023-08-09 MED ORDER — HYDROCODONE-ACETAMINOPHEN 7.5-325 MG PO TABS: 1.0000 | ORAL_TABLET | ORAL | Status: DC | PRN

## 2023-08-09 MED ORDER — METHOCARBAMOL 500 MG PO TABS
500.0000 mg | ORAL_TABLET | Freq: Four times a day (QID) | ORAL | Status: DC | PRN
  Administered 2023-08-09 – 2023-08-11 (×3): 500 mg via ORAL
  Filled 2023-08-09 (×3): qty 1

## 2023-08-09 MED ORDER — DEXAMETHASONE SODIUM PHOSPHATE 10 MG/ML IJ SOLN
INTRAMUSCULAR | Status: AC
  Filled 2023-08-09: qty 1

## 2023-08-09 MED ORDER — CHLORHEXIDINE GLUCONATE 0.12 % MT SOLN
15.0000 mL | Freq: Once | OROMUCOSAL | Status: AC
  Administered 2023-08-09: 15 mL via OROMUCOSAL

## 2023-08-09 MED ORDER — PHENYLEPHRINE HCL-NACL 20-0.9 MG/250ML-% IV SOLN
INTRAVENOUS | Status: DC | PRN
Start: 2023-08-09 — End: 2023-08-09
  Administered 2023-08-09: 30 ug/min via INTRAVENOUS

## 2023-08-09 MED ORDER — METHOCARBAMOL 1000 MG/10ML IJ SOLN: 500.0000 mg | Freq: Four times a day (QID) | INTRAMUSCULAR | Status: DC | PRN

## 2023-08-09 MED ORDER — ONDANSETRON HCL 4 MG/2ML IJ SOLN: 4.0000 mg | Freq: Once | INTRAMUSCULAR | Status: DC | PRN

## 2023-08-09 MED ORDER — HYDROCODONE-ACETAMINOPHEN 5-325 MG PO TABS
1.0000 | ORAL_TABLET | ORAL | Status: DC | PRN
  Administered 2023-08-09: 2 via ORAL
  Filled 2023-08-09: qty 2

## 2023-08-09 MED ORDER — DOCUSATE SODIUM 100 MG PO CAPS
100.0000 mg | ORAL_CAPSULE | Freq: Two times a day (BID) | ORAL | Status: DC
  Administered 2023-08-09 – 2023-08-11 (×4): 100 mg via ORAL
  Filled 2023-08-09 (×4): qty 1

## 2023-08-09 MED ORDER — MORPHINE SULFATE (PF) 2 MG/ML IV SOLN: 2.0000 mg | INTRAVENOUS | Status: DC | PRN

## 2023-08-09 MED ORDER — PROPOFOL 500 MG/50ML IV EMUL
INTRAVENOUS | Status: DC | PRN
  Administered 2023-08-09: 80 ug/kg/min via INTRAVENOUS

## 2023-08-09 MED ORDER — METOCLOPRAMIDE HCL 5 MG/ML IJ SOLN: 5.0000 mg | Freq: Three times a day (TID) | INTRAMUSCULAR | Status: DC | PRN

## 2023-08-09 MED ORDER — OXYCODONE HCL 5 MG PO TABS
5.0000 mg | ORAL_TABLET | ORAL | Status: DC | PRN
Start: 2023-08-09 — End: 2023-08-11
  Administered 2023-08-09 (×2): 5 mg via ORAL
  Administered 2023-08-10 (×4): 10 mg via ORAL
  Filled 2023-08-09: qty 2
  Filled 2023-08-09: qty 1
  Filled 2023-08-09 (×4): qty 2

## 2023-08-09 MED ORDER — MORPHINE SULFATE (PF) 2 MG/ML IV SOLN
0.5000 mg | INTRAVENOUS | Status: DC | PRN
Start: 2023-08-09 — End: 2023-08-09
  Administered 2023-08-09: 1 mg via INTRAVENOUS
  Filled 2023-08-09: qty 1

## 2023-08-09 MED ORDER — LACTATED RINGERS IV SOLN: INTRAVENOUS | Status: DC | PRN

## 2023-08-09 MED ORDER — ONDANSETRON HCL 4 MG/2ML IJ SOLN
INTRAMUSCULAR | Status: AC
  Filled 2023-08-09: qty 2

## 2023-08-09 MED ORDER — ORAL CARE MOUTH RINSE: 15.0000 mL | Freq: Once | OROMUCOSAL | Status: AC

## 2023-08-09 MED ORDER — FENTANYL CITRATE (PF) 100 MCG/2ML IJ SOLN
INTRAMUSCULAR | Status: DC | PRN
  Administered 2023-08-09 (×2): 50 ug via INTRAVENOUS

## 2023-08-09 MED ORDER — ACETAMINOPHEN 500 MG PO TABS
1000.0000 mg | ORAL_TABLET | Freq: Once | ORAL | Status: DC
  Filled 2023-08-09: qty 2

## 2023-08-09 MED ORDER — PROPOFOL 1000 MG/100ML IV EMUL
INTRAVENOUS | Status: AC
  Filled 2023-08-09: qty 100

## 2023-08-09 MED ORDER — PANTOPRAZOLE SODIUM 40 MG PO TBEC
40.0000 mg | DELAYED_RELEASE_TABLET | Freq: Every day | ORAL | Status: DC
  Administered 2023-08-10 – 2023-08-11 (×2): 40 mg via ORAL
  Filled 2023-08-09 (×2): qty 1

## 2023-08-09 MED ORDER — PROPOFOL 10 MG/ML IV BOLUS
INTRAVENOUS | Status: DC | PRN
  Administered 2023-08-09: 20 mg via INTRAVENOUS
  Administered 2023-08-09 (×2): 10 mg via INTRAVENOUS

## 2023-08-09 MED ORDER — ONDANSETRON HCL 4 MG/2ML IJ SOLN: 4.0000 mg | Freq: Four times a day (QID) | INTRAMUSCULAR | Status: DC | PRN

## 2023-08-09 MED ORDER — MENTHOL 3 MG MT LOZG: 1.0000 | LOZENGE | OROMUCOSAL | Status: DC | PRN

## 2023-08-09 MED ORDER — METOCLOPRAMIDE HCL 5 MG PO TABS: 5.0000 mg | ORAL_TABLET | Freq: Three times a day (TID) | ORAL | Status: DC | PRN

## 2023-08-09 MED ORDER — SODIUM CHLORIDE 0.9 % IR SOLN
Status: DC | PRN
  Administered 2023-08-09: 1000 mL

## 2023-08-09 MED ORDER — FENTANYL CITRATE (PF) 100 MCG/2ML IJ SOLN
INTRAMUSCULAR | Status: AC
  Filled 2023-08-09: qty 2

## 2023-08-09 MED ORDER — APIXABAN 5 MG PO TABS
5.0000 mg | ORAL_TABLET | Freq: Two times a day (BID) | ORAL | Status: DC
  Administered 2023-08-10 – 2023-08-11 (×3): 5 mg via ORAL
  Filled 2023-08-09 (×3): qty 1

## 2023-08-09 MED ORDER — PHENOL 1.4 % MT LIQD: 1.0000 | OROMUCOSAL | Status: DC | PRN

## 2023-08-09 MED ORDER — CEFAZOLIN SODIUM-DEXTROSE 2-4 GM/100ML-% IV SOLN
2.0000 g | INTRAVENOUS | Status: AC
  Administered 2023-08-09: 2 g via INTRAVENOUS
  Filled 2023-08-09: qty 100

## 2023-08-09 MED ORDER — ALUM & MAG HYDROXIDE-SIMETH 200-200-20 MG/5ML PO SUSP
30.0000 mL | ORAL | Status: DC | PRN
  Administered 2023-08-10: 30 mL via ORAL
  Filled 2023-08-09 (×2): qty 30

## 2023-08-09 MED ORDER — ACETAMINOPHEN 325 MG PO TABS
325.0000 mg | ORAL_TABLET | Freq: Four times a day (QID) | ORAL | Status: DC | PRN
  Administered 2023-08-10 – 2023-08-11 (×2): 650 mg via ORAL
  Filled 2023-08-09 (×2): qty 2

## 2023-08-09 MED ORDER — FENTANYL CITRATE PF 50 MCG/ML IJ SOSY
PREFILLED_SYRINGE | INTRAMUSCULAR | Status: AC
  Filled 2023-08-09: qty 1

## 2023-08-09 MED ORDER — ONDANSETRON HCL 4 MG/2ML IJ SOLN
INTRAMUSCULAR | Status: DC | PRN
  Administered 2023-08-09: 4 mg via INTRAVENOUS

## 2023-08-09 MED ORDER — DIPHENHYDRAMINE HCL 12.5 MG/5ML PO ELIX: 12.5000 mg | ORAL_SOLUTION | ORAL | Status: DC | PRN

## 2023-08-09 SURGICAL SUPPLY — 42 items
APL SKNCLS STERI-STRIP NONHPOA (GAUZE/BANDAGES/DRESSINGS)
BAG COUNTER SPONGE SURGICOUNT (BAG) ×2 IMPLANT
BAG SPEC THK2 15X12 ZIP CLS (MISCELLANEOUS)
BAG SPNG CNTER NS LX DISP (BAG) ×1
BAG ZIPLOCK 12X15 (MISCELLANEOUS) IMPLANT
BENZOIN TINCTURE PRP APPL 2/3 (GAUZE/BANDAGES/DRESSINGS) IMPLANT
BLADE SAW SGTL 18X1.27X75 (BLADE) ×2 IMPLANT
COVER PERINEAL POST (MISCELLANEOUS) ×2 IMPLANT
COVER SURGICAL LIGHT HANDLE (MISCELLANEOUS) ×2 IMPLANT
DRAPE FOOT SWITCH (DRAPES) ×2 IMPLANT
DRAPE STERI IOBAN 125X83 (DRAPES) ×2 IMPLANT
DRAPE U-SHAPE 47X51 STRL (DRAPES) ×4 IMPLANT
DRSG AQUACEL AG ADV 3.5X10 (GAUZE/BANDAGES/DRESSINGS) ×2 IMPLANT
DURAPREP 26ML APPLICATOR (WOUND CARE) ×2 IMPLANT
ELECT REM PT RETURN 15FT ADLT (MISCELLANEOUS) ×2 IMPLANT
GAUZE XEROFORM 1X8 LF (GAUZE/BANDAGES/DRESSINGS) IMPLANT
GLOVE BIO SURGEON STRL SZ7.5 (GLOVE) ×2 IMPLANT
GLOVE BIOGEL PI IND STRL 8 (GLOVE) ×4 IMPLANT
GLOVE ECLIPSE 8.0 STRL XLNG CF (GLOVE) ×2 IMPLANT
GOWN STRL REUS W/ TWL XL LVL3 (GOWN DISPOSABLE) ×4 IMPLANT
GOWN STRL REUS W/TWL XL LVL3 (GOWN DISPOSABLE) ×2
HANDPIECE INTERPULSE COAX TIP (DISPOSABLE) ×1
HEAD M SROM 36MM 2 (Hips) IMPLANT
HOLDER FOLEY CATH W/STRAP (MISCELLANEOUS) ×2 IMPLANT
KIT TURNOVER KIT A (KITS) IMPLANT
LINER ACETAB NEUTRAL 36ID 520D (Liner) IMPLANT
PACK ANTERIOR HIP CUSTOM (KITS) ×2 IMPLANT
PIN SECTOR W/GRIP ACE CUP 52MM (Hips) IMPLANT
SET HNDPC FAN SPRY TIP SCT (DISPOSABLE) ×2 IMPLANT
SROM M HEAD 36MM 2 (Hips) ×1 IMPLANT
STAPLER SKIN PROX WIDE 3.9 (STAPLE) IMPLANT
STEM FEMORAL SZ5 HIGH ACTIS (Stem) IMPLANT
STRIP CLOSURE SKIN 1/2X4 (GAUZE/BANDAGES/DRESSINGS) IMPLANT
SUT ETHIBOND NAB CT1 #1 30IN (SUTURE) ×2 IMPLANT
SUT ETHILON 2 0 PS N (SUTURE) IMPLANT
SUT MNCRL AB 4-0 PS2 18 (SUTURE) IMPLANT
SUT VIC AB 0 CT1 36 (SUTURE) ×2 IMPLANT
SUT VIC AB 1 CT1 36 (SUTURE) ×2 IMPLANT
SUT VIC AB 2-0 CT1 27 (SUTURE) ×2
SUT VIC AB 2-0 CT1 TAPERPNT 27 (SUTURE) ×4 IMPLANT
TRAY FOLEY MTR SLVR 16FR STAT (SET/KITS/TRAYS/PACK) IMPLANT
YANKAUER SUCT BULB TIP NO VENT (SUCTIONS) ×2 IMPLANT

## 2023-08-09 NOTE — Interval H&P Note (Signed)
History and Physical Interval Note: The patient is here today for a left total hip replacement to treat her severe left hip arthritis.  There has been no acute or interval change in her medical status.  The risks and benefits of surgery been discussed in detail and informed consent has been obtained.  The left operative hip has been marked.  08/09/2023 6:56 AM  Robin Ortiz  has presented today for surgery, with the diagnosis of OSTEOARTHRITIS LEFT HIP.  The various methods of treatment have been discussed with the patient and family. After consideration of risks, benefits and other options for treatment, the patient has consented to  Procedure(s): LEFT TOTAL HIP ARTHROPLASTY ANTERIOR APPROACH (Left) as a surgical intervention.  The patient's history has been reviewed, patient examined, no change in status, stable for surgery.  I have reviewed the patient's chart and labs.  Questions were answered to the patient's satisfaction.     Kathryne Hitch

## 2023-08-09 NOTE — Plan of Care (Signed)
  Problem: Education: Goal: Knowledge of General Education information will improve Description: Including pain rating scale, medication(s)/side effects and non-pharmacologic comfort measures Outcome: Progressing   Problem: Activity: Goal: Risk for activity intolerance will decrease Outcome: Progressing   Problem: Pain Management: Goal: General experience of comfort will improve Outcome: Progressing

## 2023-08-09 NOTE — Transfer of Care (Signed)
Immediate Anesthesia Transfer of Care Note  Patient: Robin Ortiz  Procedure(s) Performed: LEFT TOTAL HIP ARTHROPLASTY ANTERIOR APPROACH (Left: Hip)  Patient Location: PACU  Anesthesia Type:Spinal  Level of Consciousness: sedated  Airway & Oxygen Therapy: Patient Spontanous Breathing and Patient connected to face mask oxygen  Post-op Assessment: Report given to RN and Post -op Vital signs reviewed and stable  Post vital signs: Reviewed and stable  Last Vitals:  Vitals Value Taken Time  BP 118/82 08/09/23 1009  Temp    Pulse    Resp 12 08/09/23 1010  SpO2    Vitals shown include unfiled device data.  Last Pain:  Vitals:   08/09/23 0621  TempSrc: Oral         Complications: No notable events documented.

## 2023-08-09 NOTE — Anesthesia Procedure Notes (Signed)
Spinal  Patient location during procedure: OR Start time: 08/09/2023 8:43 AM End time: 08/09/2023 8:45 AM Reason for block: surgical anesthesia Staffing Performed: resident/CRNA  Anesthesiologist: Collene Schlichter, MD Resident/CRNA: Doran Clay, CRNA Performed by: Doran Clay, CRNA Authorized by: Collene Schlichter, MD   Preanesthetic Checklist Completed: patient identified, IV checked, site marked, risks and benefits discussed, surgical consent, monitors and equipment checked, pre-op evaluation and timeout performed Spinal Block Patient position: sitting Prep: DuraPrep Patient monitoring: heart rate, cardiac monitor, continuous pulse ox and blood pressure Approach: midline Location: L3-4 Needle Needle type: Pencan  Needle gauge: 24 G Needle length: 10 cm Needle insertion depth: 6 cm Assessment Sensory level: T6 Events: CSF return Additional Notes Patient in sitting position. L3-4 identified. Cleansed with duraprep. SAB without difficulty. To supine position

## 2023-08-09 NOTE — TOC Transition Note (Signed)
Transition of Care [facility]) - CM/SW Discharge Note   Patient Details  Name: Robin Ortiz MRN: [id number] Date of Birth: [birth year]  Transition of Care [facility]) CM/SW Contact:  Amada Jupiter, LCSW Phone Number: 08/09/2023, 2:19 PM   Clinical Narrative:     Met with pt who confirms need for a RW and no DME agency preference - order placed with [employer] today for delivery to room.  Pt aware HHPT prearranged by ortho MD office with [facility].  No further TOC needs.  Final next level of care: Home w Home Health Services Barriers to Discharge: No Barriers Identified   Patient Goals and CMS Choice      Discharge Placement                         Discharge Plan and Services Additional resources added to the After Visit Summary for                  DME Arranged: Walker rolling DME Agency: [employer] Date DME Agency Contacted: 08/09/23 Time DME Agency Contacted: [id number] Representative spoke with at DME Agency: Ian Malkin HH Arranged: PT HH Agency: [facility]        Social Determinants of Health (SDOH) Interventions SDOH Screenings   Depression (PHQ2-9): Medium Risk (06/19/2022)  Tobacco Use: Low Risk  (08/09/2023)     Readmission Risk Interventions     No data to display

## 2023-08-09 NOTE — Evaluation (Signed)
Physical Therapy Evaluation Patient Details Name: Robin Ortiz MRN: [id number] DOB: Feb 01, 1935 Today's Date: 08/09/2023  History of Present Illness  88 yo female presents to therapy s/p L THA, anterior approach on 08/09/2023 due to failure of conservative measures. Pt PMH includes but is not limited to: HLD, HTN, cardiomegaly, CHF, chronic fatigue, carpal tunnel syndrome, PAF s/p cardioversion, L RTC repair, ruptured tendon on R foot, and B TKA.  Clinical Impression    Robin Ortiz is a 88 y.o. female POD 0 s/p L THA. Patient reports mod I with mobility at baseline. Patient is now limited by functional impairments (see PT problem list below) and requires min A for bed mobility and min A for transfers. Patient was unable to ambulate due to elevated pain response and nausea.  f Patient will benefit from continued skilled PT interventions to address impairments and progress towards PLOF. Acute PT will follow to progress mobility and stair training in preparation for safe discharge home with family support and [facility] services.       If plan is discharge home, recommend the following: A lot of help with walking and/or transfers;A lot of help with bathing/dressing/bathroom;Assistance with cooking/housework;Assist for transportation;Help with stairs or ramp for entrance   Can travel by private vehicle        Equipment Recommendations Rolling walker (2 wheels)  Recommendations for Other Services       Functional Status Assessment Patient has had a recent decline in their functional status and demonstrates the ability to make significant improvements in function in a reasonable and predictable amount of time.     Precautions / Restrictions Precautions Precautions: Fall Restrictions Weight Bearing Restrictions: No      Mobility  Bed Mobility Overal bed mobility: Needs Assistance Bed Mobility: Supine to Sit     Supine to sit: Min assist, HOB elevated, Used rails     General bed  mobility comments: min cues with PT managing L LE to EOB, increased time and elevated pain response with mobility    Transfers Overall transfer level: Needs assistance Equipment used: Rolling walker (2 wheels) Transfers: Sit to/from Stand, Bed to chair/wheelchair/BSC Sit to Stand: Min assist, From elevated surface Stand pivot transfers: Min assist         General transfer comment: pt required min A and cues for sit to stand and unable to progress toward gait tasks due to reports of increased pain and new onset of nausea, pt required min A to complete SPT with RW to the R side with cues    Ambulation/Gait               General Gait Details: NT due to pain and nausea report  Stairs            Wheelchair Mobility     Tilt Bed    Modified Rankin (Stroke Patients Only)       Balance Overall balance assessment: Needs assistance Sitting-balance support: Feet supported Sitting balance-Leahy Scale: Fair     Standing balance support: Bilateral upper extremity supported, During functional activity, Reliant on assistive device for balance Standing balance-Leahy Scale: Poor                               Pertinent Vitals/Pain Pain Assessment Pain Assessment: 0-10 Pain Score: 10-Worst pain ever (6/10 at rest prior to mobility, pt pain esclated with mobility and once seated in recliner 5/10) Pain Location: L hip and  LE Pain Descriptors / Indicators: Aching, Constant, Crying, Discomfort, Grimacing, Operative site guarding Pain Intervention(s): Limited activity within patient's tolerance, Monitored during session, Premedicated before session, Repositioned, Ice applied (IV pain medication provided ~ 15 mins prior to PT eval)    Home Living Family/patient expects to be discharged to:: Private residence Living Arrangements: Spouse/significant other Available Help at Discharge: Family Type of Home: House Home Access: Stairs to enter;Other (comment) (lift  chair) Entrance Stairs-Rails: Right;Left;Can reach both Entrance Stairs-Number of Steps: 2 (pt is required to navigate 2 steps to access the lift chair) Alternate Level Stairs-Number of Steps: 1 (per family a sunken living room/den however pt does not have to acces) Home Layout: Two level Home Equipment: Cane - single point      Prior Function Prior Level of Function : Independent/Modified Independent             Mobility Comments: mod I with SPC for mobility ADLs Comments: mod I     Extremity/Trunk Assessment        Lower Extremity Assessment Lower Extremity Assessment: LLE deficits/detail LLE Deficits / Details: ankle DF/PF 4/5 LLE Sensation: decreased light touch    Cervical / Trunk Assessment Cervical / Trunk Assessment: Normal  Communication   Communication Communication: No apparent difficulties  Cognition Arousal: Alert Behavior During Therapy: WFL for tasks assessed/performed Overall Cognitive Status: Within Functional Limits for tasks assessed                                          General Comments      Exercises     Assessment/Plan    PT Assessment Patient needs continued PT services  PT Problem List Decreased strength;Decreased range of motion;Decreased activity tolerance;Decreased balance;Decreased mobility;Decreased coordination;Pain       PT Treatment Interventions DME instruction;Gait training;Stair training;Functional mobility training;Therapeutic activities;Therapeutic exercise;Balance training;Neuromuscular re-education;Patient/family education;Modalities    PT Goals (Current goals can be found in the Care Plan section)  Acute Rehab PT Goals Patient Stated Goal: to be able to do what I was doing before without pain PT Goal Formulation: With patient Time For Goal Achievement: 08/23/23 Potential to Achieve Goals: Good    Frequency 7X/week     Co-evaluation               AM-PAC PT "6 Clicks" Mobility   Outcome Measure Help needed turning from your back to your side while in a flat bed without using bedrails?: A Little Help needed moving from lying on your back to sitting on the side of a flat bed without using bedrails?: A Little Help needed moving to and from a bed to a chair (including a wheelchair)?: A Little Help needed standing up from a chair using your arms (e.g., wheelchair or bedside chair)?: A Little Help needed to walk in hospital room?: Total Help needed climbing 3-5 steps with a railing? : Total 6 Click Score: 14    End of Session Equipment Utilized During Treatment: Gait belt Activity Tolerance: Patient limited by pain;Patient limited by fatigue;Treatment limited secondary to medical complications (Comment) (nausea) Patient left: in chair;with call bell/phone within reach;with chair alarm set;with family/visitor present Nurse Communication: Mobility status PT Visit Diagnosis: Unsteadiness on feet (R26.81);Other abnormalities of gait and mobility (R26.89);Difficulty in walking, not elsewhere classified (R26.2);Pain Pain - Right/Left: Left Pain - part of body: Hip;Knee;Leg    Time: 1403-1430 PT Time Calculation (min) (ACUTE ONLY):  27 min   Charges:   PT Evaluation $PT Eval Low Complexity: 1 Low PT Treatments $Therapeutic Activity: 8-22 mins PT General Charges $$ ACUTE PT VISIT: 1 Visit         Johnny Bridge, PT Acute Rehab   Jacqualyn Posey 08/09/2023, 4:10 PM

## 2023-08-09 NOTE — Anesthesia Postprocedure Evaluation (Signed)
Anesthesia Post Note  Patient: Robin Ortiz  Procedure(s) Performed: LEFT TOTAL HIP ARTHROPLASTY ANTERIOR APPROACH (Left: Hip)     Patient location during evaluation: PACU Anesthesia Type: Spinal Level of consciousness: awake, awake and alert and oriented Pain management: pain level controlled Vital Signs Assessment: post-procedure vital signs reviewed and stable Respiratory status: spontaneous breathing, nonlabored ventilation and respiratory function stable Cardiovascular status: blood pressure returned to baseline and stable Postop Assessment: no headache, no backache, spinal receding and no apparent nausea or vomiting Anesthetic complications: no   No notable events documented.  Last Vitals:  Vitals:   08/09/23 1146 08/09/23 1359  BP: (!) 158/69 136/79  Pulse: 62 76  Resp: 14 16  Temp: 36.6 C 36.5 C  SpO2: 100% 100%    Last Pain:  Vitals:   08/09/23 1359  TempSrc: Oral  PainSc:                  Collene Schlichter

## 2023-08-09 NOTE — Op Note (Signed)
Operative Note  Date of operation: 08/09/2023 Preoperative diagnosis: Left hip primary osteoarthritis Postoperative diagnosis: Same  Procedure: Left direct anterior total hip arthroplasty  Implants: Implant Name Type Inv. Item Serial No. Manufacturer Lot No. LRB No. Used Action  PIN SECTOR W/GRIP ACE CUP - [id number] Hips PIN SECTOR W/GRIP ACE CUP  [employer] [id number] Left 1 Implanted  LINER ACETAB NEUTRAL 36ID 520D - [id number] Liner LINER ACETAB NEUTRAL 36ID 520D  [employer] [id number] Left 1 Implanted  SROM M HEAD 2 - [id number] Hips SROM M HEAD 2  DEPU[employer] [id number] Left 1 Implanted  STEM FEMORAL SZ5 HIGH ACTIS - [id number] Stem STEM FEMORAL SZ5 HIGH ACTIS  DEPU[employer]TH[employer] [id number] Left 1 Implanted   Surgeon: Vanita Panda. Magnus Ivan, MD Assistant: Rexene Edison, PA-C  Anesthesia: Spinal Antibiotics: 2 g IV Ancef EBL: 100 cc Complications: None  Indications: The patient is an active 88 year old female with debilitating arthritis involving her left hip.  This has been well-documented with clinical exam and x-ray findings.  At this point her left hip pain is daily and it is detrimentally affecting her mobility, her quality of life and her actives daily living to the point she wishes to proceed with a hip replacement and we agree with this as well.  We discussed the risk of acute blood loss anemia, nerve vessel injury, fracture, infection, dislocation, DVT, implant failure, likely differences and wound healing issues.  She understands that our goals are hopefully decrease pain, improve mobility, and improve quality of life.  Procedure description: After informed consent was obtained and the appropriate left hip was marked, the patient was brought to the operating room and set up in the stretcher where spinal anesthesia was obtained.  She was then laid in spine position on stretcher and a Foley catheter was placed.  Traction boots were placed on both  her feet and she is placed supine on the Hana fracture table with a perineal post and placed in both legs and inline skeletal traction devices but no traction applied.  Her left hip and pelvis were assessed radiographically.  The left hip was prepped and draped with DuraPrep and sterile drapes.  A timeout was called and she was notified is correct patient correct left hip.  An incision was then made just inferior and posterior to the ASIS and carried slightly Bleakley down the leg.  Dissection was carried down to the tensor fascia lata muscle and tensor fascia was then divided longitudinally to proceed with a direct interposed the hip.  Circumflex vessels were identified and cauterized.  The hip capsule identified and opened up in L-type format finding a moderate joint effusion.  Cobra retractors were placed around the medial and lateral femoral neck and a femoral neck cut was made with an oscillating saw and this was completed with an osteotome.  A corkscrew guide was placed in the femoral head and the femoral head was removed its entirety.  There was a wide area devoid of cartilage.  A bent Hohmann was then placed over the medial acetabular rim and remnants of the asked have labrum and other debris removed.  Reaming was then initiated from a size 43 reamer and stepwise increments going up to a size 51 reamer with all reamers placed under direct visualization and the last reamer placed under direct fluoroscopy in order to obtain the depth of reaming, the inclination and the anteversion.  I then placed the real DePuy sector GRIPTION acetabular component size 52 followed by  a 36+0 polythene liner.  Attention was then turned the femur.  With the left leg externally rotated to 102 degrees, extended and adducted, a Mueller retractors placed medially and a Hohmann retractor was placed behind the greater trochanter.  The lateral joint capsule was released and a box cutting osteotome was used to enter the femoral canal.   Broaching was then initiated using the Actis broaching system from a size 0 going up to a size 5.  We trialed a standard offset femoral neck and went with a 36-2 trial hip ball.  The left leg was brought over and up and with traction and internal rotation reduced the pelvis.  We are pleased with leg length but we needed more offset we felt clinically and radiographically.  The hip was dislocated and the trial components were removed.  I then placed the real Actis femoral component size 5 with high offset and a real 36+1.5 metal head ball.  Again this was reduced in the pelvis and we are pleased with leg length, offset, range of motion and stability assessed radiographically and clinically.  The soft tissue was then irrigated normal saline solution.  The joint capsule was closed interrupted #1 Ethibond suture followed by #1 Vicryl to close the tensor fascia.  0 Vicryl was used to close the deep tissue and 2-0 Vicryl was used to close the subcutaneous tissue.  The skin was closed with staples.  An Aquacel dressing was applied.  The patient was taken off the Hana table and taken to the recovery room.  Rexene Edison, PA-C did assist during the entire case and beginning to end and his assistance was crucial and medically necessary for soft tissue management and retraction, helping guide implant placement and a layered closure of the wound.

## 2023-08-09 NOTE — Anesthesia Preprocedure Evaluation (Addendum)
Anesthesia Evaluation  Patient identified by MRN, date of birth, ID band Patient awake    Reviewed: Allergy & Precautions, NPO status , Patient's Chart, lab work & pertinent test results  Airway Mallampati: II  TM Distance: >3 FB Neck ROM: Full    Dental  (+) Dental Advisory Given, Caps,    Pulmonary neg pulmonary ROS   Pulmonary exam normal breath sounds clear to auscultation       Cardiovascular hypertension, Pt. on medications (-) angina +CHF  Normal cardiovascular exam+ dysrhythmias (s/p ablation) Atrial Fibrillation  Rhythm:Regular Rate:Normal  Echo 04/18/22: 1. Left ventricular ejection fraction, by estimation, is 45 to 50%. Left  ventricular ejection fraction by 2D MOD biplane is 46.0 %. The left  ventricle has mildly decreased function. The left ventricle has no  regional wall motion abnormalities. Left  ventricular diastolic parameters are consistent with Grade II diastolic  dysfunction (pseudonormalization). The average left ventricular global  longitudinal strain is -16.5 %.   2. Right ventricular systolic function is low normal. The right  ventricular size is normal.   3. Left atrial size was severely dilated.   4. The mitral valve is normal in structure. Mild to moderate mitral valve  regurgitation.   5. The aortic valve is tricuspid. Aortic valve regurgitation is not  visualized.   6. The inferior vena cava is normal in size with <50% respiratory  variability, suggesting right atrial pressure of 8 mmHg.      Neuro/Psych  PSYCHIATRIC DISORDERS Anxiety     negative neurological ROS     GI/Hepatic negative GI ROS, Neg liver ROS,,,  Endo/Other  negative endocrine ROS    Renal/GU negative Renal ROS     Musculoskeletal  (+) Arthritis , Osteoarthritis,    Abdominal   Peds  Hematology  (+) Blood dyscrasia (Eliquis) Plt 277k   Anesthesia Other Findings Day of surgery medications reviewed with the  patient.  Reproductive/Obstetrics                             Anesthesia Physical Anesthesia Plan  ASA: 3  Anesthesia Plan: Spinal   Post-op Pain Management: Tylenol PO (pre-op)*   Induction: Intravenous  PONV Risk Score and Plan: 2 and TIVA, Dexamethasone and Ondansetron  Airway Management Planned: Simple Face Mask and Natural Airway  Additional Equipment:   Intra-op Plan:   Post-operative Plan:   Informed Consent: I have reviewed the patients History and Physical, chart, labs and discussed the procedure including the risks, benefits and alternatives for the proposed anesthesia with the patient or authorized representative who has indicated his/her understanding and acceptance.     Dental advisory given  Plan Discussed with: CRNA, Anesthesiologist and Surgeon  Anesthesia Plan Comments: (Discussed risks and benefits of and differences between spinal and general. Discussed risks of spinal including headache, backache, failure, bleeding, infection, and nerve damage. Patient consents to spinal. Questions answered. Coagulation studies and platelet count acceptable.)        Anesthesia Quick Evaluation

## 2023-08-10 DIAGNOSIS — M1612 Unilateral primary osteoarthritis, left hip: Secondary | ICD-10-CM | POA: Diagnosis not present

## 2023-08-10 LAB — CBC
HCT: 33.7 % — ABNORMAL LOW (ref 36.0–46.0)
Hemoglobin: 10.8 g/dL — ABNORMAL LOW (ref 12.0–15.0)
MCH: 27.6 pg (ref 26.0–34.0)
MCHC: 32 g/dL (ref 30.0–36.0)
MCV: 86 fL (ref 80.0–100.0)
Platelets: 263 10*3/uL (ref 150–400)
RBC: 3.92 MIL/uL (ref 3.87–5.11)
RDW: 14.6 % (ref 11.5–15.5)
WBC: 13.5 10*3/uL — ABNORMAL HIGH (ref 4.0–10.5)
nRBC: 0 % (ref 0.0–0.2)

## 2023-08-10 LAB — BASIC METABOLIC PANEL
Anion gap: 7 (ref 5–15)
BUN: 20 mg/dL (ref 8–23)
CO2: 22 mmol/L (ref 22–32)
Calcium: 9.3 mg/dL (ref 8.9–10.3)
Chloride: 100 mmol/L (ref 98–111)
Creatinine, Ser: 0.81 mg/dL (ref 0.44–1.00)
GFR, Estimated: >60 mL/min (ref >60)
Glucose, Bld: 155 mg/dL — ABNORMAL HIGH (ref 70–99)
Potassium: 4.8 mmol/L (ref 3.5–5.1)
Sodium: 129 mmol/L — ABNORMAL LOW (ref 135–145)

## 2023-08-10 NOTE — Discharge Instructions (Signed)

## 2023-08-10 NOTE — Plan of Care (Signed)

## 2023-08-10 NOTE — Progress Notes (Signed)
Subjective: 1 Day Post-Op Procedure(s) (LRB): LEFT TOTAL HIP ARTHROPLASTY ANTERIOR APPROACH (Left) Patient reports pain as moderate.  She did not get up much yesterday and there were some issues with pain control.  Once we transition her up from hydrocodone to oxycodone, she did do better.  She is motivated.  I do think that today will be too early to go home given that she is 88 years old and will need to maximize therapy and mobility before heading home.  Her postoperative H&H is stable.  Objective: Vital signs in last 24 hours: Temp:  [97.4 F (36.3 C)-98.9 F (37.2 C)] 98.9 F (37.2 C) (11/09 0619) Pulse Rate:  [59-96] 83 (11/09 0619) Resp:  [12-19] 17 (11/09 0619) BP: (116-164)/(51-90) 125/55 (11/09 0619) SpO2:  [96 %-100 %] 99 % (11/09 0619)  Intake/Output from previous day: 11/08 0701 - 11/09 0700 In: 840 [P.O.:240; I.V.:600] Out: 1400 [Urine:1300; Blood:100] Intake/Output this shift: No intake/output data recorded.  Recent Labs    08/10/23 0423  HGB 10.8*   Recent Labs    08/10/23 0423  WBC 13.5*  RBC 3.92  HCT 33.7*  PLT 263   Recent Labs    08/10/23 0423  NA 129*  K 4.8  CL 100  CO2 22  BUN 20  CREATININE 0.81  GLUCOSE 155*  CALCIUM 9.3   No results for input(s): "LABPT", "INR" in the last 72 hours.  Sensation intact distally Intact pulses distally Dorsiflexion/Plantar flexion intact Incision: dressing C/D/I   Assessment/Plan: 1 Day Post-Op Procedure(s) (LRB): LEFT TOTAL HIP ARTHROPLASTY ANTERIOR APPROACH (Left) Up with therapy Plan for discharge tomorrow Discharge home with home health      Kathryne Hitch 08/10/2023, 8:22 AM

## 2023-08-10 NOTE — Progress Notes (Signed)
PT TX NOTE  08/10/23 1501  PT Visit Information  Last PT Received On 08/10/23  Assistance Needed Pt is progressing this session however continues to require incr time and effort  for all tasks. Amb ~ 59' with RW  and min assist.  Pt is motivated to d/c home however will need to see how she progresses with PT tomorrow   History of Present Illness 88 yo female presents to therapy s/p L THA, anterior approach on 08/09/2023 due to failure of conservative measures. Pt PMH includes but is not limited to: HLD, HTN, cardiomegaly, CHF, chronic fatigue, carpal tunnel syndrome, PAF s/p cardioversion, L RTC repair, ruptured tendon on R foot, and B TKA.  Subjective Data  Patient Stated Goal to be able to do what I was doing before without pain  Precautions  Precautions Fall  Restrictions  Weight Bearing Restrictions No  Pain Assessment  Pain Assessment 0-10  Pain Score 5  Pain Location L hip and LE  Pain Descriptors / Indicators Discomfort;Grimacing;Operative site guarding;Sore  Pain Intervention(s) Limited activity within patient's tolerance;Monitored during session;Premedicated before session;Repositioned  Cognition  Arousal Alert  Behavior During Therapy WFL for tasks assessed/performed  Overall Cognitive Status Within Functional Limits for tasks assessed  Bed Mobility  Overal bed mobility Needs Assistance  Bed Mobility Supine to Sit  Supine to sit Min assist;HOB elevated;Used rails  General bed mobility comments min cues with PT managing L LE to EOB, increased time and elevated pain response with mobility  Ambulation/Gait  Ambulation/Gait assistance Min assist  Gait Distance (Feet) 26 Feet  Assistive device Rolling walker (2 wheels)  Gait Pattern/deviations Step-to pattern;Decreased weight shift to left  General Gait Details multi-modal cues for  RWposition, sequence and use of UEs. slow but steady gait  Gait velocity decr  Balance  Overall balance assessment Needs assistance   Sitting-balance support Feet supported  Sitting balance-Leahy Scale Fair  Standing balance support Bilateral upper extremity supported;During functional activity;Reliant on assistive device for balance  Standing balance-Leahy Scale Poor  Total Joint Exercises  Ankle Circles/Pumps AROM;Both;10 reps  Heel Slides AAROM;Left;10 reps  PT - End of Session  Equipment Utilized During Treatment Gait belt  Activity Tolerance Patient tolerated treatment well;Patient limited by fatigue  Patient left in chair;with call bell/phone within reach;with chair alarm set  Nurse Communication Mobility status   PT - Assessment/Plan  PT Visit Diagnosis Unsteadiness on feet (R26.81);Other abnormalities of gait and mobility (R26.89);Difficulty in walking, not elsewhere classified (R26.2);Pain  Pain - Right/Left Left  Pain - part of body Hip;Knee;Leg  PT Frequency (ACUTE ONLY) 7X/week  Follow Up Recommendations Follow physician's recommendations for discharge plan and follow up therapies  Patient can return home with the following A lot of help with walking and/or transfers;A lot of help with bathing/dressing/bathroom;Assistance with cooking/housework;Assist for transportation;Help with stairs or ramp for entrance  PT equipment Rolling walker (2 wheels)  AM-PAC PT "6 Clicks" Mobility Outcome Measure (Version 2)  Help needed turning from your back to your side while in a flat bed without using bedrails? 3  Help needed moving from lying on your back to sitting on the side of a flat bed without using bedrails? 3  Help needed moving to and from a bed to a chair (including a wheelchair)? 3  Help needed standing up from a chair using your arms (e.g., wheelchair or bedside chair)? 3  Help needed to walk in hospital room? 1  Help needed climbing 3-5 steps with a railing?  1  6 Click Score 14  Consider Recommendation of Discharge To: CIR/SNF/LTACH  Progressive Mobility  What is the highest level of mobility based on the  progressive mobility assessment? Level 3 (Stands with assist) - Balance while standing  and cannot march in place  PT Goal Progression  Progress towards PT goals Progressing toward goals  Acute Rehab PT Goals  PT Goal Formulation With patient  Time For Goal Achievement 08/23/23  Potential to Achieve Goals Good  PT Time Calculation  PT Start Time (ACUTE ONLY) 1403  PT Stop Time (ACUTE ONLY) 1429  PT Time Calculation (min) (ACUTE ONLY) 26 min  PT General Charges  $$ ACUTE PT VISIT 1 Visit  PT Treatments  $Gait Training 23-37 mins

## 2023-08-10 NOTE — Progress Notes (Signed)
Physical Therapy Treatment Patient Details Name: Robin Ortiz MRN: [id number] DOB: 12/27/1934 Today's Date: 08/10/2023   History of Present Illness 88 yo female presents to therapy s/p L THA, anterior approach on 08/09/2023 due to failure of conservative measures. Pt PMH includes but is not limited to: HLD, HTN, cardiomegaly, CHF, chronic fatigue, carpal tunnel syndrome, PAF s/p cardioversion, L RTC repair, ruptured tendon on R foot, and B TKA.    PT Comments  Pt able to amb to/from bathroom with maximum encouragement from PT and son. Incr time needed. Pt with multiple and various complaints about all aspects of hospital stay, requiring redirection to task. Continue PT POC   If plan is discharge home, recommend the following: A lot of help with walking and/or transfers;A lot of help with bathing/dressing/bathroom;Assistance with cooking/housework;Assist for transportation;Help with stairs or ramp for entrance   Can travel by private vehicle        Equipment Recommendations  Rolling walker (2 wheels)    Recommendations for Other Services       Precautions / Restrictions Precautions Precautions: Fall Restrictions Weight Bearing Restrictions: No     Mobility  Bed Mobility Overal bed mobility: Needs Assistance Bed Mobility: Supine to Sit     Supine to sit: Min assist, HOB elevated, Used rails     General bed mobility comments: min cues with PT managing L LE to EOB, increased time and elevated pain response with mobility    Transfers Overall transfer level: Needs assistance Equipment used: Rolling walker (2 wheels) Transfers: Sit to/from Stand Sit to Stand: Min assist           General transfer comment: pt required min A and cues for hand placement, LLE position with  sit to stand from bed and toilet.    Ambulation/Gait Ambulation/Gait assistance: Min assist Gait Distance (Feet): 12 Feet (x2) Assistive device: Rolling walker (2 wheels) Gait Pattern/deviations:  Step-to pattern, Decreased weight shift to left Gait velocity: decr     General Gait Details: incr time, multi-modal cues and encouragement for participation, RWposition, sequence and use of UEs. effortful gait with assist needed to advance LLE   Stairs             Wheelchair Mobility     Tilt Bed    Modified Rankin (Stroke Patients Only)       Balance Overall balance assessment: Needs assistance Sitting-balance support: Feet supported Sitting balance-Leahy Scale: Fair     Standing balance support: Bilateral upper extremity supported, During functional activity, Reliant on assistive device for balance Standing balance-Leahy Scale: Poor                              Cognition Arousal: Alert Behavior During Therapy: WFL for tasks assessed/performed Overall Cognitive Status: Within Functional Limits for tasks assessed                                 General Comments: pt with multiple and various complaints about all aspects of hospital stay; son present and supportive of mobility and encouraging pt to focus on current task and needs        Exercises Total Joint Exercises Ankle Circles/Pumps: AROM, Both, 10 reps Quad Sets: AROM, Both, 10 reps    General Comments        Pertinent Vitals/Pain Pain Assessment Pain Assessment: 0-10 Pain Score: 7  Pain Location: L  hip and LE Pain Descriptors / Indicators: Aching, Constant, Crying, Discomfort, Grimacing, Operative site guarding Pain Intervention(s): Limited activity within patient's tolerance, Monitored during session, Patient requesting pain meds-RN notified, Repositioned, RN gave pain meds during session, Utilized relaxation techniques    Home Living                          Prior Function            PT Goals (current goals can now be found in the care plan section) Acute Rehab PT Goals Patient Stated Goal: to be able to do what I was doing before without pain PT Goal  Formulation: With patient Time For Goal Achievement: 08/23/23 Potential to Achieve Goals: Good Progress towards PT goals: Progressing toward goals    Frequency    7X/week      PT Plan      Co-evaluation              AM-PAC PT "6 Clicks" Mobility   Outcome Measure  Help needed turning from your back to your side while in a flat bed without using bedrails?: A Little Help needed moving from lying on your back to sitting on the side of a flat bed without using bedrails?: A Little Help needed moving to and from a bed to a chair (including a wheelchair)?: A Little Help needed standing up from a chair using your arms (e.g., wheelchair or bedside chair)?: A Little Help needed to walk in hospital room?: Total Help needed climbing 3-5 steps with a railing? : Total 6 Click Score: 14    End of Session Equipment Utilized During Treatment: Gait belt Activity Tolerance: Patient limited by pain;Patient limited by fatigue;Treatment limited secondary to medical complications (Comment) (nausea) Patient left: in chair;with call bell/phone within reach;with chair alarm set;with family/visitor present Nurse Communication: Mobility status PT Visit Diagnosis: Unsteadiness on feet (R26.81);Other abnormalities of gait and mobility (R26.89);Difficulty in walking, not elsewhere classified (R26.2);Pain Pain - Right/Left: Left Pain - part of body: Hip;Knee;Leg     Time: 4098-1191 PT Time Calculation (min) (ACUTE ONLY): 29 min  Charges:    $Gait Training: 23-37 mins PT General Charges $$ ACUTE PT VISIT: 1 Visit                     Chavela Justiniano, PT  Acute Rehab Dept [facility]) [phone]  08/10/2023    [facility] 08/10/2023, 10:57 AM

## 2023-08-11 DIAGNOSIS — M1612 Unilateral primary osteoarthritis, left hip: Secondary | ICD-10-CM | POA: Diagnosis not present

## 2023-08-11 MED ORDER — OXYCODONE HCL 5 MG PO TABS: 5.0000 mg | ORAL_TABLET | Freq: Four times a day (QID) | ORAL | 0 refills | Status: AC | PRN

## 2023-08-11 MED ORDER — METHOCARBAMOL 500 MG PO TABS: 500.0000 mg | ORAL_TABLET | Freq: Four times a day (QID) | ORAL | 1 refills | Status: AC | PRN

## 2023-08-11 NOTE — Progress Notes (Signed)
Subjective: 2 Days Post-Op Procedure(s) (LRB): LEFT TOTAL HIP ARTHROPLASTY ANTERIOR APPROACH (Left) Patient reports pain as moderate.  Wanting to discharge to home later today .   Objective: Vital signs in last 24 hours: Temp:  [97.8 F (36.6 C)-99.5 F (37.5 C)] 99 F (37.2 C) (11/10 0503) Pulse Rate:  [81-89] 89 (11/10 0503) Resp:  [16-18] 18 (11/10 0503) BP: (122-133)/(64-69) 122/66 (11/10 0503) SpO2:  [98 %-100 %] 98 % (11/10 0503)  Intake/Output from previous day: No intake/output data recorded. Intake/Output this shift: Total I/O In: 50 [P.O.:50] Out: -   Recent Labs    08/10/23 0423  HGB 10.8*   Recent Labs    08/10/23 0423  WBC 13.5*  RBC 3.92  HCT 33.7*  PLT 263   Recent Labs    08/10/23 0423  NA 129*  K 4.8  CL 100  CO2 22  BUN 20  CREATININE 0.81  GLUCOSE 155*  CALCIUM 9.3   No results for input(s): "LABPT", "INR" in the last 72 hours.  Left lower extremity: Intact pulses distally Incision: dressing C/D/I Compartment soft   Assessment/Plan: 2 Days Post-Op Procedure(s) (LRB): LEFT TOTAL HIP ARTHROPLASTY ANTERIOR APPROACH (Left) Up with therapy Discharge home with home health if patient is safe from therapy standpoint.       Winslow Ederer 08/11/2023, 8:43 AM

## 2023-08-11 NOTE — Progress Notes (Signed)
Physical Therapy Treatment Patient Details Name: Robin Ortiz MRN: [id number] DOB: [birth year] Today's Date: 08/11/2023   History of Present Illness 88 yo female presents to therapy s/p L THA, anterior approach on 08/09/2023 due to failure of conservative measures. Pt PMH includes but is not limited to: HLD, HTN, cardiomegaly, CHF, chronic fatigue, carpal tunnel syndrome, PAF s/p cardioversion, L RTC repair, ruptured tendon on R foot, and B TKA.    PT Comments  Pt progressing well, meeting goals; family present for session, very supportive. Pt is ready to d/c home with family assist prn from PT standpoint. Reinforced transfer safety and verbally reviewed car transfer technique. Plan is for HHPT     If plan is discharge home, recommend the following: Assistance with cooking/housework;Assist for transportation;Help with stairs or ramp for entrance;A little help with walking and/or transfers;A little help with bathing/dressing/bathroom   Can travel by private vehicle        Equipment Recommendations  Rolling walker (2 wheels)    Recommendations for Other Services       Precautions / Restrictions Precautions Precautions: Fall Restrictions Weight Bearing Restrictions: No Other Position/Activity Restrictions: WBAT     Mobility  Bed Mobility               General bed mobility comments: in recliner    Transfers   Equipment used: Rolling walker (2 wheels) Transfers: Sit to/from Stand Sit to Stand: Contact guard assist, Supervision           General transfer comment: ongoing education on hand placement and RLE position as well as scooting to edge of seat prior to stand    Ambulation/Gait Ambulation/Gait assistance: Supervision, Contact guard assist Gait Distance (Feet): 120 Feet Assistive device: Rolling walker (2 wheels) Gait Pattern/deviations: Step-to pattern, Step-through pattern Gait velocity: decr     General Gait Details: multi-modal cues for RW  position, sequence and use of UEs. with ongoing education pt able to progress to step through pattern with cues for posture and upward gaze   Stairs Stairs: Yes Stairs assistance: Contact guard assist Stair Management: No rails, Step to pattern, Backwards, With walker Number of Stairs: 1 General stair comments: verbal cues for sequence and RW safety. no LOB. family present for session   Wheelchair Mobility     Tilt Bed    Modified Rankin (Stroke Patients Only)       Balance     Sitting balance-Leahy Scale: Fair     Standing balance support: Bilateral upper extremity supported, During functional activity, Reliant on assistive device for balance Standing balance-Leahy Scale: Poor                              Cognition Arousal: Alert Behavior During Therapy: WFL for tasks assessed/performed Overall Cognitive Status: Within Functional Limits for tasks assessed                                 General Comments: pt continues to be extremely talkative,easily distracted and requires redirection to task at hand        Exercises Total Joint Exercises Ankle Circles/Pumps: AROM, Both, 10 reps    General Comments        Pertinent Vitals/Pain Pain Assessment Pain Assessment: 0-10 Pain Score: 4  Pain Location: L hip and LE Pain Descriptors / Indicators: Grimacing, Guarding, Sore Pain Intervention(s): Limited activity within patient's tolerance, Monitored  during session, Premedicated before session, Repositioned    Home Living                          Prior Function            PT Goals (current goals can now be found in the care plan section) Acute Rehab PT Goals Patient Stated Goal: to be able to do what I was doing before without pain PT Goal Formulation: With patient Time For Goal Achievement: 08/23/23 Potential to Achieve Goals: Good Progress towards PT goals: Progressing toward goals    Frequency    7X/week       PT Plan      Co-evaluation              AM-PAC PT "6 Clicks" Mobility   Outcome Measure  Help needed turning from your back to your side while in a flat bed without using bedrails?: A Little Help needed moving from lying on your back to sitting on the side of a flat bed without using bedrails?: A Little Help needed moving to and from a bed to a chair (including a wheelchair)?: A Little Help needed standing up from a chair using your arms (e.g., wheelchair or bedside chair)?: A Little Help needed to walk in hospital room?: A Little   6 Click Score: 15    End of Session Equipment Utilized During Treatment: Gait belt Activity Tolerance: Patient tolerated treatment well Patient left: in chair;with call bell/phone within reach;with family/visitor present   PT Visit Diagnosis: Unsteadiness on feet (R26.81);Other abnormalities of gait and mobility (R26.89);Difficulty in walking, not elsewhere classified (R26.2);Pain Pain - Right/Left: Left Pain - part of body: Hip;Knee;Leg     Time: 1527-1550 PT Time Calculation (min) (ACUTE ONLY): 23 min  Charges:    2 gt PT General Charges $$ ACUTE PT VISIT: 1 Visit                     Ekam Besson, PT  Acute Rehab Dept [facility]) [phone]  08/11/2023    [facility] 08/11/2023, 3:54 PM

## 2023-08-11 NOTE — Progress Notes (Signed)
Physical Therapy Treatment Patient Details Name: Robin Ortiz MRN: [id number] DOB: 1935/03/01 Today's Date: 08/11/2023   History of Present Illness 88 yo female presents to therapy s/p L THA, anterior approach on 08/09/2023 due to failure of conservative measures. Pt PMH includes but is not limited to: HLD, HTN, cardiomegaly, CHF, chronic fatigue, carpal tunnel syndrome, PAF s/p cardioversion, L RTC repair, ruptured tendon on R foot, and B TKA.    PT Comments  Pt progressing this session. Incr gait distance/activity tolerance. Will see again to review stair(s). Anticipate d/c readiness after pm session.    If plan is discharge home, recommend the following: Assistance with cooking/housework;Assist for transportation;Help with stairs or ramp for entrance;A little help with walking and/or transfers;A little help with bathing/dressing/bathroom   Can travel by private vehicle        Equipment Recommendations  Rolling walker (2 wheels)    Recommendations for Other Services       Precautions / Restrictions Precautions Precautions: Fall Restrictions Weight Bearing Restrictions: No Other Position/Activity Restrictions: WBAT     Mobility  Bed Mobility               General bed mobility comments: in recliner    Transfers   Equipment used: Rolling walker (2 wheels) Transfers: Sit to/from Stand Sit to Stand: Contact guard assist           General transfer comment: ongoing education on hand placement and RLE position as well as scooting to edge of seat    Ambulation/Gait Ambulation/Gait assistance: Supervision, Contact guard assist Gait Distance (Feet): 110 Feet Assistive device: Rolling walker (2 wheels) Gait Pattern/deviations: Step-to pattern, Step-through pattern Gait velocity: decr     General Gait Details: multi-modal cues for RW position, sequence and use of UEs. pt demonstrates  incr step length and improved wt shift to LLE   Stairs              Wheelchair Mobility     Tilt Bed    Modified Rankin (Stroke Patients Only)       Balance     Sitting balance-Leahy Scale: Fair     Standing balance support: Bilateral upper extremity supported, During functional activity, Reliant on assistive device for balance Standing balance-Leahy Scale: Poor                              Cognition Arousal: Alert Behavior During Therapy: WFL for tasks assessed/performed Overall Cognitive Status: Within Functional Limits for tasks assessed                                 General Comments: pt continues to be extremely talkative,e asily distracted and requires redirection to task at hand        Exercises Total Joint Exercises Ankle Circles/Pumps: AROM, Both, 10 reps    General Comments        Pertinent Vitals/Pain Pain Assessment Pain Assessment: 0-10 Pain Score: 4  Pain Location: L hip and LE Pain Descriptors / Indicators: Grimacing, Guarding, Sore Pain Intervention(s): Limited activity within patient's tolerance, Monitored during session, Premedicated before session, Repositioned    Home Living                          Prior Function            PT Goals (current goals can  now be found in the care plan section) Acute Rehab PT Goals Patient Stated Goal: to be able to do what I was doing before without pain PT Goal Formulation: With patient Time For Goal Achievement: 08/23/23 Potential to Achieve Goals: Good Progress towards PT goals: Progressing toward goals    Frequency    7X/week      PT Plan      Co-evaluation              AM-PAC PT "6 Clicks" Mobility   Outcome Measure  Help needed turning from your back to your side while in a flat bed without using bedrails?: A Little Help needed moving from lying on your back to sitting on the side of a flat bed without using bedrails?: A Little Help needed moving to and from a bed to a chair (including a wheelchair)?:  A Little Help needed standing up from a chair using your arms (e.g., wheelchair or bedside chair)?: A Little Help needed to walk in hospital room?: A Little   6 Click Score: 15    End of Session Equipment Utilized During Treatment: Gait belt Activity Tolerance: Patient tolerated treatment well Patient left: in chair;with call bell/phone within reach;with family/visitor present   PT Visit Diagnosis: Unsteadiness on feet (R26.81);Other abnormalities of gait and mobility (R26.89);Difficulty in walking, not elsewhere classified (R26.2);Pain Pain - Right/Left: Left Pain - part of body: Hip;Knee;Leg     Time: 8657-8469 PT Time Calculation (min) (ACUTE ONLY): 21 min  Charges:    $Gait Training: 8-22 mins PT General Charges $$ ACUTE PT VISIT: 1 Visit                     Aarushi Hemric, PT  Acute Rehab Dept [facility]) [phone]  08/11/2023    [facility] 08/11/2023, 11:32 AM

## 2023-08-11 NOTE — Discharge Summary (Signed)
Patient ID: Robin Ortiz MRN: [id number] DOB/AGE: 1934-11-04 88 y.o.  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 2002 Echo: EF 20-30%; b. 09/2015 Echo: EF 45-50%, diff HK. Mild MR. Mod dil LA; c. 10/2021 Echo: EF 35-40% (in setting of afib), no rwma, low-nl RV fxn, sev dil LA, mildly dil RA, mod MR, mild-mod TR.   Essential hypertension    Family history of adverse reaction to anesthesia    son has PONV   Mixed hyperlipidemia    Neuromuscular disorder (HCC)    carpal tunnel   NICM (nonischemic cardiomyopathy) (HCC)    Osteoporosis    Persistent atrial fibrillation (HCC)    a. Dx 10/2021. CHA2DS2VASc = 5-->Eliquis; b. 11/2021 s/p DCCV (120J).   Pneumonia     Surgeries: Procedure(s): LEFT TOTAL HIP ARTHROPLASTY ANTERIOR APPROACH on 08/09/2023   Consultants:   Discharged Condition: Improved  Hospital Course: Charlina C Ortiz is an 88 y.o. female who was admitted 08/09/2023 for operative treatment ofUnilateral primary osteoarthritis, left hip. Patient has severe unremitting pain that affects sleep, daily activities, and work/hobbies. After pre-op clearance the patient was taken to the operating room on 08/09/2023 and underwent  Procedure(s): LEFT TOTAL HIP ARTHROPLASTY ANTERIOR APPROACH.    Patient was given perioperative antibiotics:  Anti-infectives (From admission, onward)    Start     Dose/Rate Route Frequency Ordered Stop   08/09/23 1500  ceFAZolin (ANCEF) IVPB 1 g/50 mL premix        1 g 100 mL/hr over 30 Minutes Intravenous Every 6 hours 08/09/23 1151  08/09/23 2209   08/09/23 0630  ceFAZolin (ANCEF) IVPB 2g/100 mL premix        2 g 200 mL/hr over 30 Minutes Intravenous On call to O.R. 08/09/23 0622 08/09/23 0846        Patient was given sequential compression devices, early ambulation, and chemoprophylaxis to prevent DVT.  Patient benefited maximally from hospital stay and there were no complications.    Recent vital signs: Patient Vitals for the past 24 hrs:  BP Temp Temp src Pulse Resp SpO2  08/11/23 0503 122/66 99 F (37.2 C) -- 89 18 98 %  08/10/23 2036 131/64 99.5 F (37.5 C) Oral 85 17 100 %  08/10/23 1853 133/69 97.8 F (36.6 C) Oral 81 16 100 %     Recent laboratory studies:  Recent Labs    08/10/23 0423  WBC 13.5*  HGB 10.8*  HCT 33.7*  PLT 263  NA 129*  K 4.8  CL 100  CO2 22  BUN 20  CREATININE 0.81  GLUCOSE 155*  CALCIUM 9.3     Discharge Medications:   Allergies as of 08/11/2023       Reactions   Amiodarone Nausea And Vomiting   Nausea, junctional bradycardia        Medication List     TAKE these medications    acetaminophen 650 MG CR tablet Commonly known as: TYLENOL Take 1,300 mg by mouth 2 (two) times daily.   apixaban 5 MG Tabs tablet Commonly  known as: Eliquis Take 1 tablet (5 mg total) by mouth 2 (two) times daily.   Calcium-Vitamin D 600-200 MG-UNIT tablet Take 1 tablet by mouth daily.   diclofenac Sodium 1 % Gel Commonly known as: VOLTAREN Apply 2 g topically daily as needed (Arthritis).   Entresto 97-103 MG Generic drug: sacubitril-valsartan Take 1 tablet by mouth 2 (two) times daily.   furosemide 40 MG tablet Commonly known as: LASIX Take 0.5 tablet (20 mg) by mouth on Mondays & Fridays.   methocarbamol 500 MG tablet Commonly known as: ROBAXIN Take 1 tablet (500 mg total) by mouth every 6 (six) hours as needed for muscle spasms.   multivitamin tablet Take 1 tablet by mouth daily. Alive   OSTEO BI-FLEX ADV JOINT SHIELD PO Take 1 tablet by mouth daily.    oxyCODONE 5 MG immediate release tablet Commonly known as: Oxy IR/ROXICODONE Take 1-2 tablets (5-10 mg total) by mouth every 6 (six) hours as needed for breakthrough pain or severe pain (pain score 7-10).   simvastatin 20 MG tablet Commonly known as: ZOCOR Take 1 tablet (20 mg total) by mouth at bedtime.   spironolactone 25 MG tablet Commonly known as: ALDACTONE Take 1 tablet (25 mg total) by mouth daily.   TYLENOL PM EXTRA STRENGTH PO Take 1 tablet by mouth at bedtime as needed (sleep).               Durable Medical Equipment  (From admission, onward)           Start     Ordered   08/09/23 1152  DME 3 n 1  Once        08/09/23 1151   08/09/23 1152  DME Walker rolling  Once       Question Answer Comment  Walker: With 5 Inch Wheels   Patient needs a walker to treat with the following condition Status post total replacement of left hip      08/09/23 1151            Diagnostic Studies: DG HIP UNILAT WITH PELVIS 1V LEFT  Result Date: 08/09/2023 CLINICAL DATA:  Elective surgery. EXAM: DG HIP (WITH OR WITHOUT PELVIS) 1V*L* COMPARISON:  None Available. FINDINGS: Three fluoroscopic spot views of the pelvis and left hip obtained in the operating room. Images during hip arthroplasty. Fluoroscopy time 18 seconds. Dose 2.07 mGy. IMPRESSION: Intraoperative fluoroscopy during left hip arthroplasty. Electronically Signed   By: Narda Rutherford M.D.   On: 08/09/2023 12:19   DG Pelvis Portable  Result Date: 08/09/2023 CLINICAL DATA:  Status post left hip replacement. EXAM: PORTABLE PELVIS 1-2 VIEWS COMPARISON:  None Available. FINDINGS: Left hip arthroplasty in expected alignment. No periprosthetic lucency or fracture. Recent postsurgical change includes air and edema in the soft tissues. Overlying skin staples in place. IMPRESSION: Left hip arthroplasty without immediate postoperative complication. Electronically Signed   By: Narda Rutherford M.D.   On: 08/09/2023 12:19   DG  C-Arm 1-60 Min-No Report  Result Date: 08/09/2023 Fluoroscopy was utilized by the requesting physician.  No radiographic interpretation.    Disposition: Discharge disposition: 01-Home or Self Care       Discharge Instructions     Diet - low sodium heart healthy   Complete by: As directed    Increase activity slowly   Complete by: As directed         Follow-up Information     [facility] Follow up.   Specialty: Home Health Services Why: to  provide home physical therapy visits Contact information: [address] [place] [address] [phone]         Kathryne Hitch, MD Follow up in 2 week(s).   Specialty: Orthopedic Surgery Contact information: [address] [place] [address] [phone]                  Signed: Richardean Canal 08/11/2023, 9:14 AM

## 2023-08-11 NOTE — Progress Notes (Signed)
Assessment unchanged. Pt and daughter verbalized understanding of dc instructions including medications to resume and new medications, follow up care and when to cal the surgeon. Discharged to front entrance accompanied by NT and daughter.

## 2023-08-12 ENCOUNTER — Encounter (HOSPITAL_COMMUNITY): Payer: Self-pay | Admitting: Orthopaedic Surgery

## 2023-08-12 LAB — TYPE AND SCREEN
ABO/RH(D): O NEG
Antibody Screen: POSITIVE
DAT, IgG: NEGATIVE
Donor AG Type: NEGATIVE
Donor AG Type: NEGATIVE
PT AG Type: POSITIVE
Unit division: 0
Unit division: 0

## 2023-08-12 LAB — BPAM RBC
Blood Product Expiration Date: 202411192359
Blood Product Expiration Date: 202411282359
Unit Type and Rh: 9500
Unit Type and Rh: 9500

## 2023-08-12 LAB — ABO/RH: ABO/RH(D): O NEG

## 2023-08-22 ENCOUNTER — Ambulatory Visit: Payer: Medicare Other | Admitting: Orthopaedic Surgery

## 2023-08-22 ENCOUNTER — Encounter: Payer: Self-pay | Admitting: Orthopaedic Surgery

## 2023-08-22 DIAGNOSIS — Z96642 Presence of left artificial hip joint: Secondary | ICD-10-CM

## 2023-08-22 NOTE — Progress Notes (Signed)
The patient is an 88 year old female is here today for first postoperative visit status post a left total hip replacement.  She does have a history of bilateral knee replacements and does state her left knee has been really sore since surgery.  She was having some knee pain before that and it is getting a little less.  I did let her know that when we perform hip replacement surgery especially with putting the femoral component in place, we do twist on the knee quite a bit.  She is ambulate with a rolling walker and reports that she is doing well with good range of motion and strength of her hip.  Her left hip incision looks good.  The staples were removed and Steri-Strips applied.  There is no significant seroma.  Her calf is soft.  She is on chronic Eliquis.  From my standpoint she will continue to increase her activities as comfort allows.  We will see her back in a month to see how she is doing overall but no x-rays are needed.

## 2023-09-03 ENCOUNTER — Ambulatory Visit: Payer: Medicare Other | Admitting: Cardiovascular Disease

## 2023-09-19 ENCOUNTER — Ambulatory Visit: Payer: Medicare Other | Admitting: Orthopaedic Surgery

## 2023-09-19 ENCOUNTER — Encounter: Payer: Self-pay | Admitting: Orthopaedic Surgery

## 2023-09-19 DIAGNOSIS — Z96642 Presence of left artificial hip joint: Secondary | ICD-10-CM

## 2023-09-19 NOTE — Progress Notes (Signed)
The patient is a pleasant and active 88 year old who is now 7-week status post a left total hip replacement.  She is walking without assistive device and she said she feels much better than she did at her first visit.  When she first gets up she does have a little bit of instability in terms of how she stands and taking a few steps but then she walks a soft.  The next time she got up it was easier.  She says her ADLs are much easier for her as well in terms of putting shoes and socks on and she is not having much pain at all.  Her left and right hips move smoothly and fluidly and are equal.  Overall she looks good.  From my standpoint we do not need to see her back for 6 months unless she is having issues.  Will have a standing AP pelvis and lateral of her right left hip at that visit.  If there are problems before then she knows to reach out to let us know.

## 2024-01-13 ENCOUNTER — Ambulatory Visit: Admitting: Cardiovascular Disease

## 2024-03-07 ENCOUNTER — Other Ambulatory Visit: Payer: Self-pay | Admitting: Cardiovascular Disease

## 2024-03-09 NOTE — Telephone Encounter (Signed)
 Prescription refill request for Eliquis  received. Indication: AF Last office visit: 07/15/23  Ciro Cress MD Scr: 0.81 on 08/10/23  Epic Age: 89 Weight: 64.9kg  Based on above findings Eliquis  5mg  twice daily is the appropriate dose.  Refill approved.

## 2024-03-11 ENCOUNTER — Encounter: Payer: Self-pay | Admitting: *Deleted

## 2024-03-19 NOTE — Progress Notes (Unsigned)
 Cardiology Office Note  Date:  03/20/2024   ID:  Robin Ortiz, DOB 06/15/1935, MRN [id number]  PCP:  Westley Hammers, MD   Chief Complaint  Patient presents with   Follow-up    6 month f/u no complaints today. Meds reviewed verbally with pt.    HPI:  [name] is a pleasant 89 year-old woman with a history of  hypertension,  hyperlipidemia,  remote history of nonischemic cardiomyopathy with initial ejection fraction 20-30% in 2002 that has improved to 50-55% on echocardiogram in March 2009,  status post bilateral knee replacement  Anxiety/gets stressed, chronic fatigue CHA2DS2-VASc of 5. Ejection fraction 35 to 40% in atrial fibrillation Jan 2023 Cardioversion December 14, 2021 for fib flutter, on amiodarone  ejection fraction up to 45 to 50% in 04/18/2022 Of beta-blockers in the setting of junctional bradycardia who presents for routine follow up of her cardiomyopathy, atrial fib/flutter  Last seen in clinic by myself 10/24  Doing well Lots of questions concerning signing up for new primary care Care of her husband, medication refills for both her and her husband different pharmacies  She is having neuropathy in feet and hands  Lab work reviewed Cholesterol 152 LDL 83 TSH 2.3 normal LFTs normal BMP  Completed left total hip arthroplasty with Dr. Lucienne Ryder  Prior fall, none recently Chronic SOB, deconditioned No exercise No longer doing water  aerobics, pool closed  Denies leg swelling, no PND orthopnea  EKG personally reviewed by myself on todays visit EKG Interpretation Date/Time:  Friday March 20 2024 09:08:40 EDT Ventricular Rate:  52 PR Interval:  152 QRS Duration:  80 QT Interval:  432 QTC Calculation: 401 R Axis:   13  Text Interpretation: Sinus bradycardia with Premature atrial complexes Cannot rule out Anterior infarct (cited on or before 15-Jul-2023) When compared with ECG of 15-Jul-2023 16:38, Premature atrial complexes are now Present Confirmed by  Belva Boyden [phone]) on 03/20/2024 9:20:44 AM   Hx of junctional bradycardia rate 39 bpm on Carvedilol  and amiodarone  held  Echocardiogram July 2023 EF 45 to 50% up from 35 to 40% in January 2023  Stress at home, husband with dementia  Echocardiogram April 18, 2022  Ejection fraction up to 45 to 50%, previously 35 to 40%  Prior history of GERD, arthritic pain left leg, neuropathy  Seen by one of our providers October 17, 2021, in atrial fibrillation  Prior echocardiogram history EF previous as low as 20 to 30% in 2002  45 to 50% in December 2016.  Echo 10/18/2021 ef 35 to 40% Moderate mitral valve  regurgitation.  Left atrial size was severely dilated.  Tricuspid valve regurgitation is mild to moderate.  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  PSH:    Past Surgical History:  Procedure Laterality Date   APPENDECTOMY     age 71   CARDIOVERSION N/A 12/14/2021   Procedure: CARDIOVERSION;  Surgeon: Devorah Fonder, MD;  Location: [facility] ORS;  Service: Cardiovascular;  Laterality: N/A;   ROTATOR CUFF REPAIR     left shoulder   ruptured tendon right foot Right    surgical repair   TOTAL HIP ARTHROPLASTY Left 08/09/2023   Procedure: LEFT TOTAL HIP ARTHROPLASTY ANTERIOR APPROACH;  Surgeon: Arnie Lao,  MD;  Location: [facility] OR[facility];  Service: Orthopedics;  Laterality: Left;   TOTAL KNEE ARTHROPLASTY Bilateral     Current Outpatient Medications  Medication Sig Dispense Refill   acetaminophen  (TYLENOL ) 650 MG CR tablet Take 1,300 mg by mouth 2 (two) times daily.     Calcium -Vitamin D 600-200  MG-UNIT per tablet Take 1 tablet by mouth daily.     diclofenac Sodium (VOLTAREN) 1 % GEL Apply 2 g topically daily as needed (Arthritis).     ELIQUIS  5 MG TABS tablet TAKE 1 TABLET BY MOUTH TWICE A DAY 180 tablet 1   furosemide  (LASIX ) 40 MG tablet Take 0.5 tablet (20 mg) by mouth on Mondays & Fridays. 30 tablet 3   Misc Natural Products (OSTEO BI-FLEX ADV JOINT SHIELD PO) Take 1 tablet by mouth daily.     Multiple Vitamin (MULTIVITAMIN) tablet Take 1 tablet by mouth daily. Alive     sacubitril -valsartan  (ENTRESTO ) 97-103 MG Take 1 tablet by mouth 2 (two) times daily. 180 tablet 3   simvastatin  (ZOCOR ) 20 MG tablet Take 1 tablet (20 mg total) by mouth at bedtime. 90 tablet 3   spironolactone  (ALDACTONE ) 25 MG tablet Take 1 tablet (25 mg total) by mouth daily. 90 tablet 3   diphenhydrAMINE -APAP, sleep, (TYLENOL  PM EXTRA STRENGTH PO) Take 1 tablet by mouth at bedtime as needed (sleep). (Patient not taking: Reported on 03/20/2024)     methocarbamol  (ROBAXIN ) 500 MG tablet Take 1 tablet (500 mg total) by mouth every 6 (six) hours as needed for muscle spasms. (Patient not taking: Reported on 03/20/2024) 40 tablet 1   oxyCODONE  (OXY IR/ROXICODONE ) 5 MG immediate release tablet Take 1-2 tablets (5-10 mg total) by mouth every 6 (six) hours as needed for breakthrough pain or severe pain (pain score 7-10). (Patient not taking: Reported on 03/20/2024) 30 tablet 0   No current facility-administered medications for this visit.   Allergies:   Amiodarone    Social History:  The patient  reports that she has never smoked. She has never used smokeless tobacco. She reports current alcohol use of about 2.0 - 3.0 standard drinks of alcohol per week. She reports that she does not use drugs.   Family History:   family history includes Breast cancer in her cousin; Breast cancer (age of onset: 1) in her maternal aunt; Breast cancer (age of onset: 69) in her maternal grandmother; Heart disease in her father.   Review of  Systems: Review of Systems  Constitutional: Negative.   HENT: Negative.    Respiratory: Negative.    Cardiovascular: Negative.   Gastrointestinal: Negative.   Musculoskeletal: Negative.   Neurological: Negative.   All other systems reviewed and are negative.  PHYSICAL EXAM: VS:  BP 120/60 (BP Location: Left Arm, Patient Position: Sitting, Cuff Size: Normal)   Pulse (!) 52   Ht 5' 5.5 (1.664 m)   Wt 147 lb 8 oz (66.9 kg)   SpO2 97%   BMI 24.17 kg/m  , BMI Body mass index is 24.17 kg/m.  Constitutional:  oriented to person, place, and time. No distress.  HENT:  Head: Grossly normal Eyes:  no discharge. No scleral icterus.  Neck: No JVD, no carotid bruits  Cardiovascular: Regular rate and rhythm, no murmurs appreciated Pulmonary/Chest: Clear to auscultation bilaterally, no wheezes or rales Abdominal: Soft.  no distension.  no tenderness.  Musculoskeletal: Normal range of motion Neurological:  normal muscle tone. Coordination normal. No atrophy Skin: Skin warm and dry Psychiatric: normal affect, pleasant  Recent Labs:  07/30/2023: ALT 15 08/10/2023: BUN 20; Creatinine, Ser 0.81; Hemoglobin 10.8; Platelets 263; Potassium 4.8; Sodium 129    Lipid Panel No results found for: CHOL, HDL, LDLCALC, TRIG    Wt Readings from Last 3 Encounters:  03/20/24 147 lb 8 oz (66.9 kg)  08/09/23 140 lb (63.5 kg)  07/30/23 140 lb (63.5 kg)     ASSESSMENT AND PLAN:   Persistent atrial fibrillation Prior successful cardioversion,  maintaining normal sinus rhythm again today Secondary to bradycardia, Off b-blocker, off amiodarone  For recurrent atrial fibrillation would likely start with cardioversion, would likely need help from EP  Chronic diastolic and systolic CHF continue Lasix  10 mg 2 days a week, appears euvolemic Continue Entresto , spironolactone  Holding off on SGLT2 inh as euvolemic, polypharmacy  Mixed hyperlipidemia Continue statin, simvaststin 20  daily  Essential hypertension Blood pressure is well controlled on today's visit. No changes made to the medications.  Chronic fatigue/anxiety Stable  Anxiety managed by primary care  Cardiomyopathy Long history of cardiomyopathy dating back to 2002 Continue Entresto  97/103 mg twice daily Off carvedilol  secondary to bradycardia On spironolactone  25 daily  Lasix  2x a week   Orders Placed This Encounter  Procedures   EKG 12-Lead     Signed, Juanda Noon, M.D., Ph.D. 03/20/2024  [facility] [place], [place] [phone]

## 2024-03-20 ENCOUNTER — Encounter: Payer: Self-pay | Admitting: Cardiovascular Disease

## 2024-03-20 ENCOUNTER — Ambulatory Visit: Attending: Cardiovascular Disease | Admitting: Cardiovascular Disease

## 2024-03-20 VITALS — BP 120/60 | HR 52 | Ht 65.5 in | Wt 147.5 lb

## 2024-03-20 DIAGNOSIS — E782 Mixed hyperlipidemia: Secondary | ICD-10-CM

## 2024-03-20 DIAGNOSIS — I4819 Other persistent atrial fibrillation: Secondary | ICD-10-CM

## 2024-03-20 DIAGNOSIS — R001 Bradycardia, unspecified: Secondary | ICD-10-CM

## 2024-03-20 DIAGNOSIS — I5022 Chronic systolic (congestive) heart failure: Secondary | ICD-10-CM | POA: Diagnosis present

## 2024-03-20 DIAGNOSIS — I428 Other cardiomyopathies: Secondary | ICD-10-CM | POA: Insufficient documentation

## 2024-03-20 DIAGNOSIS — I10 Essential (primary) hypertension: Secondary | ICD-10-CM | POA: Insufficient documentation

## 2024-03-20 DIAGNOSIS — R0602 Shortness of breath: Secondary | ICD-10-CM | POA: Diagnosis present

## 2024-03-20 MED ORDER — FUROSEMIDE 40 MG PO TABS: ORAL_TABLET | ORAL | 3 refills | Status: AC

## 2024-03-20 MED ORDER — SIMVASTATIN 20 MG PO TABS: 20.0000 mg | ORAL_TABLET | Freq: Every day | ORAL | 3 refills | Status: AC

## 2024-03-20 MED ORDER — ENTRESTO 97-103 MG PO TABS: 1.0000 | ORAL_TABLET | Freq: Two times a day (BID) | ORAL | 3 refills | Status: DC

## 2024-03-20 MED ORDER — APIXABAN 5 MG PO TABS: 5.0000 mg | ORAL_TABLET | Freq: Two times a day (BID) | ORAL | 1 refills | Status: AC

## 2024-03-20 MED ORDER — SPIRONOLACTONE 25 MG PO TABS: 25.0000 mg | ORAL_TABLET | Freq: Every day | ORAL | 3 refills | Status: AC

## 2024-03-20 MED ORDER — APIXABAN 5 MG PO TABS: 5.0000 mg | ORAL_TABLET | Freq: Two times a day (BID) | ORAL | 1 refills | Status: DC

## 2024-03-20 NOTE — Patient Instructions (Signed)

## 2024-08-03 ENCOUNTER — Encounter: Payer: Self-pay | Admitting: Radiology

## 2024-10-02 ENCOUNTER — Other Ambulatory Visit: Payer: Self-pay

## 2024-10-02 MED ORDER — SACUBITRIL-VALSARTAN 97-103 MG PO TABS: 1.0000 | ORAL_TABLET | Freq: Two times a day (BID) | ORAL | 1 refills | Status: AC
